# Patient Record
Sex: Female | Born: 1935 | Race: White | Hispanic: No | Smoking: Former smoker
Health system: Southern US, Community
[De-identification: ages and names within clinical notes are randomized; demographics above are authoritative.]

## PROBLEM LIST (undated history)

## (undated) DIAGNOSIS — I4891 Unspecified atrial fibrillation: Secondary | ICD-10-CM

## (undated) DIAGNOSIS — E079 Disorder of thyroid, unspecified: Secondary | ICD-10-CM

## (undated) DIAGNOSIS — M069 Rheumatoid arthritis, unspecified: Principal | ICD-10-CM

## (undated) DIAGNOSIS — K859 Acute pancreatitis without necrosis or infection, unspecified: Secondary | ICD-10-CM

## (undated) DIAGNOSIS — E785 Hyperlipidemia, unspecified: Secondary | ICD-10-CM

## (undated) DIAGNOSIS — B029 Zoster without complications: Secondary | ICD-10-CM

## (undated) DIAGNOSIS — I2699 Other pulmonary embolism without acute cor pulmonale: Secondary | ICD-10-CM

## (undated) DIAGNOSIS — J449 Chronic obstructive pulmonary disease, unspecified: Secondary | ICD-10-CM

## (undated) DIAGNOSIS — I10 Essential (primary) hypertension: Secondary | ICD-10-CM

## (undated) HISTORY — PX: TOTAL HIP ARTHROPLASTY: SHX124

## (undated) HISTORY — PX: APPENDECTOMY: SHX54

## (undated) HISTORY — PX: FOOT SURGERY: SHX648

---

## 2013-10-04 ENCOUNTER — Encounter (HOSPITAL_COMMUNITY): Payer: Self-pay | Admitting: Emergency Medicine

## 2013-10-04 ENCOUNTER — Emergency Department (HOSPITAL_COMMUNITY): Payer: Medicare Other

## 2013-10-04 ENCOUNTER — Emergency Department (HOSPITAL_COMMUNITY)
Admission: EM | Admit: 2013-10-04 | Discharge: 2013-10-04 | Disposition: A | Discharge status: Home / Self Care | Payer: Medicare Other | Attending: Emergency Medicine | Admitting: Emergency Medicine

## 2013-10-04 DIAGNOSIS — J449 Chronic obstructive pulmonary disease, unspecified: Secondary | ICD-10-CM | POA: Insufficient documentation

## 2013-10-04 DIAGNOSIS — J4489 Other specified chronic obstructive pulmonary disease: Secondary | ICD-10-CM | POA: Insufficient documentation

## 2013-10-04 DIAGNOSIS — M7989 Other specified soft tissue disorders: Secondary | ICD-10-CM | POA: Insufficient documentation

## 2013-10-04 DIAGNOSIS — IMO0002 Reserved for concepts with insufficient information to code with codable children: Secondary | ICD-10-CM | POA: Insufficient documentation

## 2013-10-04 DIAGNOSIS — G8929 Other chronic pain: Secondary | ICD-10-CM | POA: Insufficient documentation

## 2013-10-04 DIAGNOSIS — I10 Essential (primary) hypertension: Secondary | ICD-10-CM | POA: Insufficient documentation

## 2013-10-04 DIAGNOSIS — E079 Disorder of thyroid, unspecified: Secondary | ICD-10-CM | POA: Insufficient documentation

## 2013-10-04 DIAGNOSIS — M25549 Pain in joints of unspecified hand: Secondary | ICD-10-CM | POA: Insufficient documentation

## 2013-10-04 DIAGNOSIS — M542 Cervicalgia: Secondary | ICD-10-CM | POA: Insufficient documentation

## 2013-10-04 DIAGNOSIS — Z79899 Other long term (current) drug therapy: Secondary | ICD-10-CM | POA: Insufficient documentation

## 2013-10-04 DIAGNOSIS — M25561 Pain in right knee: Secondary | ICD-10-CM

## 2013-10-04 DIAGNOSIS — M79641 Pain in right hand: Secondary | ICD-10-CM

## 2013-10-04 DIAGNOSIS — M25569 Pain in unspecified knee: Secondary | ICD-10-CM | POA: Insufficient documentation

## 2013-10-04 HISTORY — DX: Chronic obstructive pulmonary disease, unspecified: J44.9

## 2013-10-04 HISTORY — DX: Disorder of thyroid, unspecified: E07.9

## 2013-10-04 HISTORY — DX: Essential (primary) hypertension: I10

## 2013-10-04 MED ORDER — PREDNISONE 20 MG PO TABS: 40.0000 mg | ORAL_TABLET | Freq: Every day | ORAL | Status: DC

## 2013-10-04 MED ORDER — KETOROLAC TROMETHAMINE 60 MG/2ML IM SOLN
30.0000 mg | Freq: Once | INTRAMUSCULAR | Status: AC
  Administered 2013-10-04: 30 mg via INTRAMUSCULAR
  Filled 2013-10-04: qty 2

## 2013-10-04 MED ORDER — TRAMADOL HCL 50 MG PO TABS
50.0000 mg | ORAL_TABLET | Freq: Once | ORAL | Status: AC
  Administered 2013-10-04: 50 mg via ORAL
  Filled 2013-10-04: qty 1

## 2013-10-04 MED ORDER — FENTANYL CITRATE 0.05 MG/ML IJ SOLN
100.0000 ug | Freq: Once | INTRAMUSCULAR | Status: AC
Start: 2013-10-04 — End: 2013-10-04
  Administered 2013-10-04: 100 ug via INTRAMUSCULAR
  Filled 2013-10-04: qty 2

## 2013-10-04 MED ORDER — OXYCODONE HCL 5 MG PO TABS: 5.0000 mg | ORAL_TABLET | ORAL | Status: DC | PRN

## 2013-10-04 MED ORDER — PREDNISONE 50 MG PO TABS
60.0000 mg | ORAL_TABLET | Freq: Once | ORAL | Status: AC
  Administered 2013-10-04: 60 mg via ORAL
  Filled 2013-10-04 (×2): qty 1

## 2013-10-04 NOTE — ED Notes (Signed)
Pt aware of outpatient [place] for October 06, 2013 at [facility] radiology at 8am

## 2013-10-04 NOTE — ED Notes (Signed)
Worsening knee pain since December 17th. States pain to neck with both arms tingling x 1 week. Finger tips feel like the are burned. States unable to grasp anything with right hand.

## 2013-10-04 NOTE — ED Provider Notes (Signed)
CSN: [id number]     Arrival date & time 10/04/13  1159 History  This chart was scribed for [employer]. Rubin Payor, MD by Bennett Scrape, ED Scribe. This patient was seen in room APA03/APA03 and the patient's care was started at 1:36 PM.   CC: Paresthesias   The history is provided by the patient. No language interpreter was used.   HPI Comments: Kristina May is a 78 y.o. female who presents to the Emergency Department complaining of intermittent bilateral arm paresthesias described as tingling with a glove distribution with associated bilateral hand swelling that started 4 days ago. Pt reports that the right hand is more swollen than the left and she states that she has been unable to close her right hand fully. During the episodes, she states that she has pain comes on rapidly in her hands and radiates into bilateral shoulders and she has a burnt feeling to her fingertips. She also reports associated neck pain beneath the occipital region. She denies that the pain feels like it's coming from the neck stating that it starts in her hands and works up to the neck. She also reports cramping and paresthesias in her toes as well. She has tried taking Aleve with no improvement. She denies any decreased ROM of her elbows or shoulders during the episodes.She denies having any prior episodes of the same or h/o neck pain. She denies having any recent falls or traumas.  She denies any decreased sensations, weakness or true numbness. She denies smoking or h/o CA. 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 She denies any prolonged computer use or other repetitive hand motions. She states that she gets blood work every 6 months per request of her PCP, last one was Sep 03, 2013. Only notable lab was the elevated HLD.   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 She reports that she has had a prior US of the knee that was negative on Dec. 17th, 2014. She states  that she receives cortisone injections in the left hip due to DDD. She denies any worsening of symptoms after her last injection. She denies being on any chronic pain medication for her issues. She denies any prior RA diagnoses.     Past Medical History  Diagnosis Date  . COPD (chronic obstructive pulmonary disease)   . Hypertension   . Thyroid disease    Past Surgical History  Procedure Laterality Date  . Foot surgery     No family history on file. History  Substance Use Topics  . Smoking status: Never Smoker   . Smokeless tobacco: Not on file  . Alcohol Use: Yes     Comment: Occ   No OB history provided.  Review of Systems  Musculoskeletal: Positive for neck pain. Negative for back pain.  Neurological: Negative for weakness and numbness.  All other systems reviewed and are negative.    Allergies  Other  Home Medications   Current Outpatient Rx  Name  Route  Sig  Dispense  Refill  . Cholecalciferol ([employer] VIT D 5000 HIGH-POTENCY PO)   Oral   Take 1 tablet by mouth daily.         Marland Kitchen co-enzyme Q-10 30 MG capsule   Oral   Take 30 mg by mouth daily.         . Fluticasone-Salmeterol (ADVAIR) 250-50 MCG/DOSE AEPB   Inhalation   Inhale 1 puff into the lungs 2 (two) times  daily.         . glucosamine-chondroitin 500-400 MG tablet   Oral   Take 1 tablet by mouth daily.         Marland Kitchen levothyroxine (SYNTHROID, LEVOTHROID) 125 MCG tablet   Oral   Take 125 mcg by mouth daily before breakfast.         . metoprolol (LOPRESSOR) 100 MG tablet   Oral   Take 100 mg by mouth 2 (two) times daily. Patient takes 1 tablet equal to 100 mg in the morning and takes 1/2 tablet 50 mg in the evening         . Multiple Vitamin (MULTIVITAMIN WITH MINERALS) TABS tablet   Oral   Take 1 tablet by mouth daily.         . pravastatin (PRAVACHOL) 40 MG tablet   Oral   Take 40 mg by mouth at bedtime.         Marland Kitchen tiotropium (SPIRIVA) 18 MCG inhalation capsule   Inhalation   Place  18 mcg into inhaler and inhale daily.         Marland Kitchen oxyCODONE (OXY IR/ROXICODONE) 5 MG immediate release tablet   Oral   Take 1 tablet (5 mg total) by mouth every 4 (four) hours as needed for moderate pain or severe pain.   10 tablet   0   . predniSONE (DELTASONE) 20 MG tablet   Oral   Take 2 tablets (40 mg total) by mouth daily.   6 tablet   0     Triage Vitals: BP 154/77  Pulse 63  Temp(Src) 98.7 F (37.1 C) (Oral)  Resp 18  Ht 5\' 6"  (1.676 m)  Wt 230 lb (104.327 kg)  BMI 37.14 kg/m2  SpO2 98%  Physical Exam  Nursing note and vitals reviewed. Constitutional: She is oriented to person, place, and time. She appears well-developed and well-nourished. No distress.  HENT:  Head: Normocephalic and atraumatic.  Eyes: EOM are normal.  Neck: Neck supple. No tracheal deviation present.  Mild c-spine midline tenderness. Mild bilateral trapezial tenderness  Cardiovascular: Normal rate.   Pulmonary/Chest: Effort normal. No respiratory distress.  Musculoskeletal:  Swelling of right hand from wrist distally, decreased flexion and extension due to pain, decreased flexion and extension of right fingers due to pain, paresthesias over the right hand but sensation is intact, strong radial pulse, good cap refill, edema of right lower leg and right knee which are chronic per pt. Normal ROM of the right elbow and shoulder  Neurological: She is alert and oriented to person, place, and time.  Good strength of the upper extremities.  Skin: Skin is warm and dry.  Psychiatric: She has a normal mood and affect. Her behavior is normal.    ED Course  Procedures (including critical care time)  DIAGNOSTIC STUDIES: Oxygen Saturation is 98% on RA, normal by my interpretation.    COORDINATION OF CARE: 1:48 PM-Advised pt that her symptoms are less likely from problems with blood flow. Discussed treatment plan which includes x-ray of the right hand and c-spine with pt at bedside and pt agreed to plan.  She is requesting to have something other than Tylenol products or hydrocodone due to them "not working".     Labs Review Labs Reviewed - No data to display Imaging Review Dg Cervical Spine Complete  10/04/2013   CLINICAL DATA:  Neck and bilateral arm pain.  EXAM: CERVICAL SPINE  4+ VIEWS  COMPARISON:  None.  FINDINGS: The cervical vertebral bodies  are normally aligned. Disc spaces are fairly well maintained. Mild degenerative disc disease at C5-6. The facets are normally aligned. There is mild bilateral bony foraminal narrowing at C5-6 due to uncinate spurring changes. No acute bony findings. No abnormal prevertebral soft tissue swelling. The C1-2 articulations are maintained. The lung apices are clear.  IMPRESSION: Degenerative disc disease mainly at C5-6 with bilateral bony foraminal narrowing.  No acute bony findings.   Electronically Signed   By: Loralie Champagne M.D.   On: 10/04/2013 14:44   Dg Hand Complete Right  10/04/2013   CLINICAL DATA:  Right hand pain.  EXAM: RIGHT HAND - COMPLETE 3+ VIEW  COMPARISON:  None.  FINDINGS: Mild to moderate osteoarthritic type degenerative changes involving the DIP and PIP joints of the fingers. No findings for under erosive arthropathy or acute abnormality.  IMPRESSION: No acute bony findings.  Mild degenerative changes.   Electronically Signed   By: Loralie Champagne M.D.   On: 10/04/2013 14:45    EKG Interpretation   None       MDM   1. Right hand pain   2. Knee pain, chronic, right    Patient with pain in her right hand with some mild swelling. Also as had similar pain in her left hand, but that has resolved. She also has neck pain. She's had pain in her right knee for a while also, she's had swelling in that leg but has had a negative ultrasound. X-ray of the hand are reassuring. X-ray of the neck shows some degenerative disc disease, but not a level which would give her the paresthesias over the middle of her hand. We'll give steroids and pain  medicines. Will get ultrasound tomorrow.  I have personally performed the services described in this documentation, which was scribed in my presence. The recorded information has been reviewed and is accurate.     Juliet Rude. Rubin Payor, MD 10/04/13 2200

## 2013-10-04 NOTE — Discharge Instructions (Signed)
Pain of Unknown Etiology (Pain Without a Known Cause) °You have come to your caregiver because of pain. Pain can occur in any part of the body. Often there is not a definite cause. If your laboratory (blood or urine) work was normal and x-rays or other studies were normal, your caregiver may treat you without knowing the cause of the pain. An example of this is the headache. Most headaches are diagnosed by taking a history. This means your caregiver asks you questions about your headaches. Your caregiver determines a treatment based on your answers. Usually testing done for headaches is normal. Often testing is not done unless there is no response to medications. Regardless of where your pain is located today, you can be given medications to make you comfortable. If no physical cause of pain can be found, most cases of pain will gradually leave as suddenly as they came.  °If you have a painful condition and no reason can be found for the pain, It is importantthat you follow up with your caregiver. If the pain becomes worse or does not go away, it may be necessary to repeat tests and look further for a possible cause. °· Only take over-the-counter or prescription medicines for pain, discomfort, or fever as directed by your caregiver. °· For the protection of your privacy, test results can not be given over the phone. Make sure you receive the results of your test. Ask as to how these results are to be obtained if you have not been informed. It is your responsibility to obtain your test results. °· You may continue all activities unless the activities cause more pain. When the pain lessens, it is important to gradually resume normal activities. Resume activities by beginning slowly and gradually increasing the intensity and duration of the activities or exercise. During periods of severe pain, bed-rest may be helpful. Lay or sit in any position that is comfortable. °· Ice used for acute (sudden) conditions may be  effective. Use a large plastic bag filled with ice and wrapped in a towel. This may provide pain relief. °· See your caregiver for continued problems. They can help or refer you for exercises or physical therapy if necessary. °If you were given medications for your condition, do not drive, operate machinery or power tools, or sign legal documents for 24 hours. Do not drink alcohol, take sleeping pills, or take other medications that may interfere with treatment. °See your caregiver immediately if you have pain that is becoming worse and not relieved by medications. °Document Released: 06/12/2001 Document Revised: 12/10/2011 Document Reviewed: 09/17/2005 °ExitCare® Patient Information ©2014 [employer]. ° °

## 2013-10-06 ENCOUNTER — Ambulatory Visit (HOSPITAL_COMMUNITY)
Admit: 2013-10-06 | Discharge: 2013-10-06 | Disposition: A | Discharge status: Home / Self Care | Payer: Medicare Other | Source: Ambulatory Visit | Attending: Emergency Medicine | Admitting: Emergency Medicine

## 2013-10-06 DIAGNOSIS — M79609 Pain in unspecified limb: Secondary | ICD-10-CM | POA: Insufficient documentation

## 2013-10-06 DIAGNOSIS — M7989 Other specified soft tissue disorders: Secondary | ICD-10-CM | POA: Insufficient documentation

## 2013-10-06 NOTE — ED Provider Notes (Signed)
Pt for upper extr [place].  [place] neg.  Pt referred to Dr. Gerilyn Pilgrim and given 30 percocet for pain  Benny Lennert, MD 10/06/13 [phone]

## 2013-10-11 ENCOUNTER — Encounter (HOSPITAL_COMMUNITY): Payer: Self-pay | Admitting: Emergency Medicine

## 2013-10-11 ENCOUNTER — Emergency Department (HOSPITAL_COMMUNITY): Payer: Medicare Other

## 2013-10-11 ENCOUNTER — Inpatient Hospital Stay (HOSPITAL_COMMUNITY): Payer: Medicare Other

## 2013-10-11 ENCOUNTER — Observation Stay (HOSPITAL_COMMUNITY)
Admission: EM | Admit: 2013-10-11 | Discharge: 2013-10-12 | Disposition: A | Discharge status: Home / Self Care | Payer: Medicare Other | Attending: Family Medicine | Admitting: Family Medicine

## 2013-10-11 DIAGNOSIS — I10 Essential (primary) hypertension: Secondary | ICD-10-CM | POA: Diagnosis present

## 2013-10-11 DIAGNOSIS — Z87891 Personal history of nicotine dependence: Secondary | ICD-10-CM | POA: Insufficient documentation

## 2013-10-11 DIAGNOSIS — M2550 Pain in unspecified joint: Secondary | ICD-10-CM

## 2013-10-11 DIAGNOSIS — J4489 Other specified chronic obstructive pulmonary disease: Secondary | ICD-10-CM | POA: Insufficient documentation

## 2013-10-11 DIAGNOSIS — E785 Hyperlipidemia, unspecified: Secondary | ICD-10-CM | POA: Diagnosis present

## 2013-10-11 DIAGNOSIS — M19049 Primary osteoarthritis, unspecified hand: Secondary | ICD-10-CM | POA: Diagnosis present

## 2013-10-11 DIAGNOSIS — M130 Polyarthritis, unspecified: Secondary | ICD-10-CM | POA: Diagnosis present

## 2013-10-11 DIAGNOSIS — M25561 Pain in right knee: Secondary | ICD-10-CM | POA: Diagnosis present

## 2013-10-11 DIAGNOSIS — M069 Rheumatoid arthritis, unspecified: Principal | ICD-10-CM | POA: Diagnosis present

## 2013-10-11 DIAGNOSIS — IMO0002 Reserved for concepts with insufficient information to code with codable children: Secondary | ICD-10-CM | POA: Insufficient documentation

## 2013-10-11 DIAGNOSIS — M1710 Unilateral primary osteoarthritis, unspecified knee: Secondary | ICD-10-CM | POA: Insufficient documentation

## 2013-10-11 DIAGNOSIS — E039 Hypothyroidism, unspecified: Secondary | ICD-10-CM | POA: Diagnosis present

## 2013-10-11 DIAGNOSIS — J449 Chronic obstructive pulmonary disease, unspecified: Secondary | ICD-10-CM | POA: Insufficient documentation

## 2013-10-11 HISTORY — DX: Rheumatoid arthritis, unspecified: M06.9

## 2013-10-11 HISTORY — DX: Hyperlipidemia, unspecified: E78.5

## 2013-10-11 LAB — COMPREHENSIVE METABOLIC PANEL
ALBUMIN: 3.6 g/dL (ref 3.5–5.2)
ALT: 18 U/L (ref 0–35)
AST: 19 U/L (ref 0–37)
Alkaline Phosphatase: 80 U/L (ref 39–117)
BUN: 16 mg/dL (ref 6–23)
CO2: 25 mEq/L (ref 19–32)
CREATININE: 0.97 mg/dL (ref 0.50–1.10)
Calcium: 9.6 mg/dL (ref 8.4–10.5)
Chloride: 100 mEq/L (ref 96–112)
GFR calc Af Amer: 65 mL/min — ABNORMAL LOW (ref >90)
GFR, EST NON AFRICAN AMERICAN: 56 mL/min — AB (ref >90)
Glucose, Bld: 117 mg/dL — ABNORMAL HIGH (ref 70–99)
Potassium: 3.8 mEq/L (ref 3.7–5.3)
Sodium: 138 mEq/L (ref 137–147)
TOTAL PROTEIN: 6.7 g/dL (ref 6.0–8.3)
Total Bilirubin: 0.4 mg/dL (ref 0.3–1.2)

## 2013-10-11 LAB — CBC WITH DIFFERENTIAL/PLATELET
BASOS ABS: 0 10*3/uL (ref 0.0–0.1)
BASOS PCT: 0 % (ref 0–1)
EOS ABS: 0.2 10*3/uL (ref 0.0–0.7)
EOS PCT: 2 % (ref 0–5)
HCT: 41.8 % (ref 36.0–46.0)
Hemoglobin: 14.1 g/dL (ref 12.0–15.0)
Lymphocytes Relative: 14 % (ref 12–46)
Lymphs Abs: 1.8 10*3/uL (ref 0.7–4.0)
MCH: 30.9 pg (ref 26.0–34.0)
MCHC: 33.7 g/dL (ref 30.0–36.0)
MCV: 91.7 fL (ref 78.0–100.0)
MONO ABS: 1 10*3/uL (ref 0.1–1.0)
Monocytes Relative: 8 % (ref 3–12)
Neutro Abs: 10.2 10*3/uL — ABNORMAL HIGH (ref 1.7–7.7)
Neutrophils Relative %: 77 % (ref 43–77)
PLATELETS: 320 10*3/uL (ref 150–400)
RBC: 4.56 MIL/uL (ref 3.87–5.11)
RDW: 13.6 % (ref 11.5–15.5)
WBC: 13.2 10*3/uL — ABNORMAL HIGH (ref 4.0–10.5)

## 2013-10-11 LAB — SEDIMENTATION RATE: Sed Rate: 25 mm/hr — ABNORMAL HIGH (ref 0–22)

## 2013-10-11 MED ORDER — SODIUM CHLORIDE 0.9 % IV SOLN
INTRAVENOUS | Status: DC
  Administered 2013-10-11: 125 mL/h via INTRAVENOUS

## 2013-10-11 MED ORDER — METOPROLOL TARTRATE 25 MG PO TABS
50.0000 mg | ORAL_TABLET | Freq: Two times a day (BID) | ORAL | Status: DC
  Filled 2013-10-11: qty 4

## 2013-10-11 MED ORDER — MOMETASONE FURO-FORMOTEROL FUM 100-5 MCG/ACT IN AERO
2.0000 | INHALATION_SPRAY | Freq: Two times a day (BID) | RESPIRATORY_TRACT | Status: DC
  Administered 2013-10-12: 2 via RESPIRATORY_TRACT
  Filled 2013-10-11: qty 8.8

## 2013-10-11 MED ORDER — SIMVASTATIN 20 MG PO TABS
20.0000 mg | ORAL_TABLET | Freq: Every day | ORAL | Status: DC
Start: 2013-10-12 — End: 2013-10-12
  Administered 2013-10-12: 20 mg via ORAL
  Filled 2013-10-11: qty 1

## 2013-10-11 MED ORDER — LEVOTHYROXINE SODIUM 125 MCG PO TABS
125.0000 ug | ORAL_TABLET | Freq: Every day | ORAL | Status: DC
  Administered 2013-10-12: 125 ug via ORAL
  Filled 2013-10-11 (×3): qty 1

## 2013-10-11 MED ORDER — NAPROXEN SODIUM 220 MG PO TABS: 220.0000 mg | ORAL_TABLET | Freq: Every day | ORAL | Status: DC | PRN

## 2013-10-11 MED ORDER — OXYCODONE HCL 5 MG PO TABS
10.0000 mg | ORAL_TABLET | Freq: Once | ORAL | Status: AC
  Administered 2013-10-11: 10 mg via ORAL
  Filled 2013-10-11: qty 2

## 2013-10-11 MED ORDER — ADULT MULTIVITAMIN W/MINERALS CH
1.0000 | ORAL_TABLET | Freq: Every day | ORAL | Status: DC
  Administered 2013-10-12 (×2): 1 via ORAL
  Filled 2013-10-11 (×2): qty 1

## 2013-10-11 MED ORDER — OXYCODONE-ACETAMINOPHEN 5-325 MG PO TABS
1.0000 | ORAL_TABLET | Freq: Four times a day (QID) | ORAL | Status: DC | PRN
  Administered 2013-10-11 – 2013-10-12 (×3): 1 via ORAL
  Filled 2013-10-11 (×3): qty 1

## 2013-10-11 MED ORDER — GADOBENATE DIMEGLUMINE 529 MG/ML IV SOLN
20.0000 mL | Freq: Once | INTRAVENOUS | Status: AC
  Administered 2013-10-11: 20 mL via INTRAVENOUS

## 2013-10-11 MED ORDER — NAPROXEN 250 MG PO TABS
250.0000 mg | ORAL_TABLET | Freq: Every day | ORAL | Status: DC | PRN
  Filled 2013-10-11 (×3): qty 1

## 2013-10-11 MED ORDER — TIOTROPIUM BROMIDE MONOHYDRATE 18 MCG IN CAPS
18.0000 ug | ORAL_CAPSULE | Freq: Every day | RESPIRATORY_TRACT | Status: DC
  Administered 2013-10-12: 18 ug via RESPIRATORY_TRACT
  Filled 2013-10-11: qty 5

## 2013-10-11 MED ORDER — HEPARIN SODIUM (PORCINE) 5000 UNIT/ML IJ SOLN
5000.0000 [IU] | Freq: Three times a day (TID) | INTRAMUSCULAR | Status: DC
  Administered 2013-10-11 – 2013-10-12 (×3): 5000 [IU] via SUBCUTANEOUS
  Filled 2013-10-11 (×5): qty 1

## 2013-10-11 NOTE — ED Notes (Signed)
Pt in radiology.  Report given to floor for [name], Primary RN.  [name], EMT transporting pt.

## 2013-10-11 NOTE — H&P (Signed)
[facility] History and Physical  Kaily Wragg XLK:[id number] DOB: 02/12/1936 DOA: 10/11/2013  Referring physician: EDP PCP: No primary provider on file.   Chief Complaint: Polyarthritis   HPI: Kristina May is a 78 y.o. female who presents to the ED with c/o joint pain.  Pain is located in index and middle finger MCP joints bilaterally, bilateral knee joints, neck pain.  Symptoms onset 3 days ago (of note she completed a course of steroids at that time).  Symptoms persistent since then.  Review of Systems: No recent travel or insect bites that she knows of, Systems reviewed.  As above, otherwise negative  Past Medical History  Diagnosis Date  . COPD (chronic obstructive pulmonary disease)   . Hypertension   . Thyroid disease    Past Surgical History  Procedure Laterality Date  . Foot surgery     Social History:  reports that she has quit smoking. She does not have any smokeless tobacco history on file. She reports that she drinks alcohol. She reports that she does not use illicit drugs.  Allergies  Allergen Reactions  . Latex Other (See Comments)    Causes blisters  . Betamethasone Hives  . Dexamethasone Hives  . Tape Other (See Comments)    Caused blisters   . Tylenol [Acetaminophen] Other (See Comments)    Doesn't work for patient   . Claritin [Loratadine] Other (See Comments)    Doesn't work for patient    Family History  Problem Relation Age of Onset  . Multiple sclerosis       Prior to Admission medications   Medication Sig Start Date End Date Taking? Authorizing Provider  Cholecalciferol (VITAMIN D) 2000 UNITS tablet Take 2,000 Units by mouth daily.   Yes Historical Provider, MD  Coenzyme Q10 200 MG capsule Take 200 mg by mouth daily.   Yes Historical Provider, MD  Fluticasone-Salmeterol (ADVAIR) 250-50 MCG/DOSE AEPB Inhale 1 puff into the lungs 2 (two) times daily.   Yes Historical Provider, MD  glucosamine-chondroitin 500-400 MG tablet Take 1  tablet by mouth daily.   Yes Historical Provider, MD  levothyroxine (SYNTHROID, LEVOTHROID) 125 MCG tablet Take 125 mcg by mouth daily before breakfast.   Yes Historical Provider, MD  Menthol-Methyl Salicylate (MUSCLE RUB EX) Apply 1 application topically daily as needed (for pain).   Yes Historical Provider, MD  metoprolol (LOPRESSOR) 100 MG tablet Take 50-100 mg by mouth 2 (two) times daily. Patient takes 1 tablet equal to 100 mg in the morning and takes 1/2 tablet 50 mg in the evening   Yes Historical Provider, MD  Multiple Vitamin (MULTIVITAMIN WITH MINERALS) TABS tablet Take 1 tablet by mouth daily.   Yes Historical Provider, MD  naproxen sodium (ANAPROX) 220 MG tablet Take 220 mg by mouth daily as needed (for pain).   Yes Historical Provider, MD  oxyCODONE-acetaminophen (PERCOCET/ROXICET) 5-325 MG per tablet Take 1 tablet by mouth every 6 (six) hours as needed for severe pain.   Yes Historical Provider, MD  pravastatin (PRAVACHOL) 40 MG tablet Take 40 mg by mouth at bedtime.   Yes Historical Provider, MD  tiotropium (SPIRIVA) 18 MCG inhalation capsule Place 18 mcg into inhaler and inhale daily.   Yes Historical Provider, MD  predniSONE (DELTASONE) 20 MG tablet Take 2 tablets (40 mg total) by mouth daily. 10/04/13   Juliet Rude. Rubin Payor, MD   Physical Exam: Filed Vitals:   10/11/13 2003  BP: 139/57  Pulse: 80  Temp:   Resp:  BP 139/57  Pulse 80  Temp(Src) 98.7 F (37.1 C) (Oral)  Resp 19  SpO2 98%  General Appearance:    Alert, oriented, no distress, appears stated age  Head:    Normocephalic, atraumatic  Eyes:    PERRL, EOMI, sclera non-icteric        Nose:   Nares without drainage or epistaxis. Mucosa, turbinates normal  Throat:   Moist mucous membranes. Oropharynx without erythema or exudate.  Neck:   Supple. No carotid bruits.  No thyromegaly.  No lymphadenopathy.   Back:     No CVA tenderness, no spinal tenderness  Lungs:     Clear to auscultation bilaterally, without  wheezes, rhonchi or rales  Chest wall:    No tenderness to palpitation  Heart:    Regular rate and rhythm without murmurs, gallops, rubs  Abdomen:     Soft, non-tender, nondistended, normal bowel sounds, no organomegaly  Genitalia:    deferred  Rectal:    deferred  Extremities:   Index and middle finger MCP joints swolen and inflamed on B hands, B knee pain.  Pulses:   2+ and symmetric all extremities  Skin:   Skin color, texture, turgor normal, no rashes or lesions  Lymph nodes:   Cervical, supraclavicular, and axillary nodes normal  Neurologic:   CNII-XII intact. Normal strength, sensation and reflexes      throughout    Labs on Admission:  Basic Metabolic Panel:  Recent Labs Lab 10/11/13 1733  NA 138  K 3.8  CL 100  CO2 25  GLUCOSE 117*  BUN 16  CREATININE 0.97  CALCIUM 9.6   Liver Function Tests:  Recent Labs Lab 10/11/13 1733  AST 19  ALT 18  ALKPHOS 80  BILITOT 0.4  PROT 6.7  ALBUMIN 3.6   No results found for this basename: LIPASE, AMYLASE,  in the last 168 hours No results found for this basename: AMMONIA,  in the last 168 hours CBC:  Recent Labs Lab 10/11/13 1733  WBC 13.2*  NEUTROABS 10.2*  HGB 14.1  HCT 41.8  MCV 91.7  PLT 320   Cardiac Enzymes: No results found for this basename: CKTOTAL, CKMB, CKMBINDEX, TROPONINI,  in the last 168 hours  BNP (last 3 results) No results found for this basename: PROBNP,  in the last 8760 hours CBG: No results found for this basename: GLUCAP,  in the last 168 hours  Radiological Exams on Admission: Mr Laqueta Jean Wo Contrast  10/11/2013   CLINICAL DATA:  Swelling of the right upper and lower extremity. Worsening pain and numbness.  EXAM: MRI HEAD WITHOUT AND WITH CONTRAST  MRI CERVICAL SPINE WITHOUT AND WITH CONTRAST  TECHNIQUE: Multiplanar, multiecho pulse sequences of the brain and surrounding structures, and cervical spine, to include the craniocervical junction and cervicothoracic junction, were obtained  without and with intravenous contrast.  CONTRAST:  87mL MULTIHANCE GADOBENATE DIMEGLUMINE 529 MG/ML IV SOLN  COMPARISON:  None.  FINDINGS: MRI HEAD FINDINGS  Diffusion imaging does not show any acute or subacute infarction. The brainstem and cerebellum are normal. The cerebral hemispheres show scattered foci of T2 and FLAIR signal within the deep white matter consistent with chronic small vessel disease. No cortical or large vessel territory infarction. No mass lesion, hemorrhage, hydrocephalus or extra-axial collection. After contrast administration, no abnormal enhancement occurs.  MRI CERVICAL SPINE FINDINGS  Alignment is normal. Ample subarachnoid space surrounds the cervical spinal cord. No cord lesion. No abnormal enhancement.  C3-4 show shallow protrusion of disk  material that narrows the ventral subarachnoid space but does not affect the cord. There is moderate foraminal stenosis bilaterally.  C5-6 shows spondylosis with endplate osteophytes and bulging of the disc. There is foraminal narrowing bilaterally of a moderate degree.  No significant osseous lesion. Benign hemangioma within the T1 vertebral body.  IMPRESSION: MRI brain: No acute finding. Chronic small-vessel changes within the hemispheric white matter.  MRI cervical spine: No cord lesion. 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   Electronically Signed   By: Paulina FusiMark  Shogry M.D.   On: 10/11/2013 20:04   Mr Cervical Spine W Wo Contrast  10/11/2013   CLINICAL DATA:  Swelling of the right upper and lower extremity. Worsening pain and numbness.  EXAM: MRI HEAD WITHOUT AND WITH CONTRAST  MRI CERVICAL SPINE WITHOUT AND WITH CONTRAST  TECHNIQUE: Multiplanar, multiecho pulse sequences of the brain and surrounding structures, and cervical spine, to include the craniocervical junction and cervicothoracic junction, were obtained without and with intravenous contrast.  CONTRAST:  20mL  MULTIHANCE GADOBENATE DIMEGLUMINE 529 MG/ML IV SOLN  COMPARISON:  None.  FINDINGS: MRI HEAD FINDINGS  Diffusion imaging does not show any acute or subacute infarction. The brainstem and cerebellum are normal. The cerebral hemispheres show scattered foci of T2 and FLAIR signal within the deep white matter consistent with chronic small vessel disease. No cortical or large vessel territory infarction. No mass lesion, hemorrhage, hydrocephalus or extra-axial collection. After contrast administration, no abnormal enhancement occurs.  MRI CERVICAL SPINE FINDINGS  Alignment is normal. Ample subarachnoid space surrounds the cervical spinal cord. No cord lesion. No abnormal enhancement.  C3-4 show shallow protrusion of disk material that narrows the ventral subarachnoid space but does not affect the cord. There is moderate foraminal stenosis bilaterally.  C5-6 shows spondylosis with endplate osteophytes and bulging of the disc. There is foraminal narrowing bilaterally of a moderate degree.  No significant osseous lesion. Benign hemangioma within the T1 vertebral body.  IMPRESSION: MRI brain: No acute finding. Chronic small-vessel changes within the hemispheric white matter.  MRI cervical spine: No cord lesion. 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   Electronically Signed   By: Paulina FusiMark  Shogry M.D.   On: 10/11/2013 20:04    EKG: Independently reviewed.  Assessment/Plan Active Problems:   Polyarthritis   1. Symmetric polyarthritis - effecting the MCP joints on hands, knees.  Highly suspicious for autoimmune or reactive arthritis.  Work up ordered thus far includes RF, CCP, CRP, ANA, HCV, Lyme, B19.  DDx is broad and includes but not limited to: RA, SLE, viral arthritis, Lyme, PMR, among others.  Likely will wish to get Rheumatology involved to determine best course of treatment.    Code Status: Full Code  Family Communication: Family at  bedside Disposition Plan: Admit to inpatient   Time spent: 70 min  GARDNER, JARED M. [facility] Pager [phone]  If 7AM-7PM, please contact the day team taking care of the patient [email or web address] Password [email or web address] 10/11/2013, 9:28 PM

## 2013-10-11 NOTE — ED Provider Notes (Signed)
CSN: [id number]     Arrival date & time 10/11/13  1520 History   First MD Initiated Contact with Patient 10/11/13 1647     Chief Complaint  Patient presents with  . Joint Pain    (Consider location/radiation/quality/duration/timing/severity/associated sxs/prior Treatment) The history is provided by the patient.   Patient here complaining of a three-day history of bilateral upper extremity weakness and pain. Symptoms have been persistent and have been associated with neck pain. Patient seen recently for similar complaints and had a plan x-rays of her C-spine which showed DJD. Denies any change in her bowel or bladder function. Does note some weakness in her legs as well but not as severe as her upper arms. Decreased grip strength noted. Symptoms worse with movement and better with rest. Past Medical History  Diagnosis Date  . COPD (chronic obstructive pulmonary disease)   . Hypertension   . Thyroid disease    Past Surgical History  Procedure Laterality Date  . Foot surgery     No family history on file. History  Substance Use Topics  . Smoking status: Former Games developer  . Smokeless tobacco: Not on file  . Alcohol Use: Yes     Comment: Occ   OB History   Grav Para Term Preterm Abortions TAB SAB Ect Mult Living                 Review of Systems  All other systems reviewed and are negative.    Allergies  Dexamethasone; Latex; Tape; Tylenol; Betamethasone; and Claritin  Home Medications   Current Outpatient Rx  Name  Route  Sig  Dispense  Refill  . Cholecalciferol (VITAMIN D) 2000 UNITS tablet   Oral   Take 2,000 Units by mouth daily.         . Coenzyme Q10 200 MG capsule   Oral   Take 200 mg by mouth daily.         . Fluticasone-Salmeterol (ADVAIR) 250-50 MCG/DOSE AEPB   Inhalation   Inhale 1 puff into the lungs 2 (two) times daily.         Marland Kitchen glucosamine-chondroitin 500-400 MG tablet   Oral   Take 1 tablet by mouth daily.         Marland Kitchen levothyroxine  (SYNTHROID, LEVOTHROID) 125 MCG tablet   Oral   Take 125 mcg by mouth daily before breakfast.         . metoprolol (LOPRESSOR) 100 MG tablet   Oral   Take 50-100 mg by mouth 2 (two) times daily. Patient takes 1 tablet equal to 100 mg in the morning and takes 1/2 tablet 50 mg in the evening         . Multiple Vitamin (MULTIVITAMIN WITH MINERALS) TABS tablet   Oral   Take 1 tablet by mouth daily.         Marland Kitchen oxyCODONE-acetaminophen (PERCOCET/ROXICET) 5-325 MG per tablet   Oral   Take 1 tablet by mouth every 6 (six) hours as needed for severe pain.         . pravastatin (PRAVACHOL) 40 MG tablet   Oral   Take 40 mg by mouth at bedtime.         . predniSONE (DELTASONE) 20 MG tablet   Oral   Take 2 tablets (40 mg total) by mouth daily.   6 tablet   0   . tiotropium (SPIRIVA) 18 MCG inhalation capsule   Inhalation   Place 18 mcg into inhaler and inhale daily.  BP 141/64  Pulse 76  Temp(Src) 98.7 F (37.1 C) (Oral)  Resp 19  SpO2 96% Physical Exam  Vitals reviewed. Constitutional: She is oriented to person, place, and time. She appears well-developed and well-nourished.  Non-toxic appearance. No distress.  HENT:  Head: Normocephalic and atraumatic.  Eyes: Conjunctivae, EOM and lids are normal. Pupils are equal, round, and reactive to light.  Neck: Normal range of motion. Neck supple. No tracheal deviation present. No mass present.  Cardiovascular: Normal rate, regular rhythm and normal heart sounds.  Exam reveals no gallop.   No murmur heard. Pulmonary/Chest: Effort normal and breath sounds normal. No stridor. No respiratory distress. She has no decreased breath sounds. She has no wheezes. She has no rhonchi. She has no rales.  Abdominal: Soft. Normal appearance and bowel sounds are normal. She exhibits no distension. There is no tenderness. There is no rebound and no CVA tenderness.  Musculoskeletal: Normal range of motion. She exhibits no edema and no  tenderness.       Hands: Swelling and erythema noted  Neurological: She is alert and oriented to person, place, and time. No cranial nerve deficit or sensory deficit. GCS eye subscore is 4. GCS verbal subscore is 5. GCS motor subscore is 6.  Reflex Scores:      Brachioradialis reflexes are 1+ on the right side and 1+ on the left side. Bilateral upper extremity weakness with 3 of 5 strength. Normal abduction of arms. Radial pulse 2+ bilaterally.  Skin: Skin is warm and dry. No abrasion and no rash noted.  Psychiatric: She has a normal mood and affect. Her speech is normal and behavior is normal.    ED Course  Procedures (including critical care time) Labs Review Labs Reviewed  CBC WITH DIFFERENTIAL  COMPREHENSIVE METABOLIC PANEL  SEDIMENTATION RATE   Imaging Review No results found.  EKG Interpretation   None       MDM  No diagnosis found. Patient given 2 Percocet here for pain. Patient has had trouble walking due to pain in her knees as well as her hips. Her MRI of her brain and C-spine were negative. However due to her continuing pain and unclear etiology of her symptoms she will be admitted by the hospitalist service for further evaluation.    Toy Baker, MD 10/11/13 2101

## 2013-10-11 NOTE — ED Notes (Signed)
Pt states she received cortisone shot 12/17 to left hip then started having right knee pain with swelling.  Doppler was done and ruled out blood clots but was reported she had 2 Baker's Cyst.  Pt reports that she went to [facility] on 1/4 after waking up with both arms numb and right hand swollen,  Cervical xrays done and right arm doppler negative for clot.  Pt now returns with right knee pain, left hand swelling.

## 2013-10-11 NOTE — ED Notes (Signed)
Pt currently in MRI

## 2013-10-12 ENCOUNTER — Encounter (HOSPITAL_COMMUNITY): Payer: Self-pay | Admitting: Family Medicine

## 2013-10-12 ENCOUNTER — Inpatient Hospital Stay (HOSPITAL_COMMUNITY): Payer: Medicare Other

## 2013-10-12 DIAGNOSIS — M19049 Primary osteoarthritis, unspecified hand: Secondary | ICD-10-CM

## 2013-10-12 DIAGNOSIS — I10 Essential (primary) hypertension: Secondary | ICD-10-CM | POA: Diagnosis present

## 2013-10-12 DIAGNOSIS — M2550 Pain in unspecified joint: Secondary | ICD-10-CM

## 2013-10-12 DIAGNOSIS — E785 Hyperlipidemia, unspecified: Secondary | ICD-10-CM

## 2013-10-12 DIAGNOSIS — M069 Rheumatoid arthritis, unspecified: Secondary | ICD-10-CM | POA: Diagnosis present

## 2013-10-12 DIAGNOSIS — M25561 Pain in right knee: Secondary | ICD-10-CM | POA: Diagnosis present

## 2013-10-12 DIAGNOSIS — M130 Polyarthritis, unspecified: Secondary | ICD-10-CM

## 2013-10-12 DIAGNOSIS — E039 Hypothyroidism, unspecified: Secondary | ICD-10-CM | POA: Diagnosis present

## 2013-10-12 LAB — RHEUMATOID FACTOR: Rhuematoid fact SerPl-aCnc: 215 IU/mL — ABNORMAL HIGH (ref <=14)

## 2013-10-12 LAB — HEPATITIS C ANTIBODY: HCV Ab: NEGATIVE

## 2013-10-12 LAB — C-REACTIVE PROTEIN: CRP: 3.7 mg/dL — ABNORMAL HIGH (ref <0.60)

## 2013-10-12 MED ORDER — BUPIVACAINE HCL 0.25 % IJ SOLN
5.0000 mL | Freq: Once | INTRAMUSCULAR | Status: AC
  Administered 2013-10-12: 5 mL via INTRA_ARTICULAR
  Filled 2013-10-12 (×2): qty 5

## 2013-10-12 MED ORDER — SENNOSIDES-DOCUSATE SODIUM 8.6-50 MG PO TABS: 1.0000 | ORAL_TABLET | Freq: Two times a day (BID) | ORAL | Status: DC | PRN

## 2013-10-12 MED ORDER — METHYLPREDNISOLONE ACETATE 80 MG/ML IJ SUSP
80.0000 mg | Freq: Once | INTRAMUSCULAR | Status: DC
Start: 2013-10-12 — End: 2013-10-12

## 2013-10-12 MED ORDER — METOPROLOL TARTRATE 50 MG PO TABS
50.0000 mg | ORAL_TABLET | Freq: Every day | ORAL | Status: DC
  Administered 2013-10-12: 50 mg via ORAL
  Filled 2013-10-12 (×2): qty 1

## 2013-10-12 MED ORDER — METHYLPREDNISOLONE ACETATE 40 MG/ML IJ SUSP
80.0000 mg | Freq: Once | INTRAMUSCULAR | Status: AC
  Administered 2013-10-12: 80 mg via INTRA_ARTICULAR
  Filled 2013-10-12: qty 2

## 2013-10-12 MED ORDER — NAPROXEN 500 MG PO TABS
500.0000 mg | ORAL_TABLET | Freq: Two times a day (BID) | ORAL | Status: DC
Start: 2013-10-12 — End: 2018-02-10

## 2013-10-12 MED ORDER — OXYCODONE-ACETAMINOPHEN 5-325 MG PO TABS: 1.0000 | ORAL_TABLET | Freq: Four times a day (QID) | ORAL | Status: DC | PRN

## 2013-10-12 MED ORDER — METOPROLOL TARTRATE 100 MG PO TABS
100.0000 mg | ORAL_TABLET | Freq: Every day | ORAL | Status: DC
  Administered 2013-10-12: 100 mg via ORAL
  Filled 2013-10-12: qty 1

## 2013-10-12 NOTE — Discharge Summary (Signed)
Physician Discharge Summary  Kristina May RXV:[id number] DOB: 31-Oct-1935 DOA: 10/11/2013  PCP: Dr. Marcelina Morel, MD FAX [phone]   Admit date: 10/11/2013 Discharge date: 10/12/2013  Recommendations for Outpatient Follow-up:  1. Please schedule patient with a rheumatologist asap 2. Please schedule patient with an orthopedic surgeon to follow up right knee  3. Recheck CBC and BMP on outpatient followup  4. Please send for records of pending rheumatology labs at [facility], [place]  Discharge Diagnoses:     Rheumatoid arthritis(714.0) - Acute   Active Problems:   Polyarthritis   Right knee pain   Arthritis pain, hand   Unspecified essential hypertension   Unspecified hypothyroidism   Dyslipidemia  Discharge Condition: stable   Diet recommendation: heart healthy  Filed Weights   10/11/13 2300  Weight: 226 lb (102.513 kg)    History of present illness:  Kristina May is a 78 y.o. female who presents to the ED with c/o joint pain. Pain is located in index and middle finger MCP joints bilaterally, bilateral knee joints, neck pain. Symptoms onset 3 days PTA (of note she completed a course of steroids at that time). She had been seen in the ED and they had given her the prescription for steroids.  She reports that she had some improvement in symptoms but they did not totally resolve. She also reported worsening swelling and pain in the right knee. Pt was admitted for pain control and for further workup.    Pt with recent onset of severe joint pain in bilateral hands and right knee. Seen in ED 09/16/2013 and 10/04/2013 for evaluation. Thus far doppler study for LE DVT was negative for clot and showed Bakers cyst right knee. She has completed a steroid dose pak. Persistent pain despite treatment with oral steroids and rest. No history of RA, gout or other autoimmune disease known to patient. No previous knee injury. 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 No previous knee  surgeries or injections. Now with swelling of right knee with pain on weight bearing and ROM. Has not tried ice or heat. History of IA steroid injections to hip for DJD with successful relief. Currently using glucosamine, naproxen and percocet for pain in addition to oral steroids. Has not seen a Rheumatologist.    Review of Systems: No recent travel or insect bites that she knows of, Systems reviewed. As above, otherwise negative  Hospital Course:  1. Symmetric polyarthritis - effecting the MCP joints on hands, knees. Highly suspicious for autoimmune or reactive arthritis. Work up ordered thus far includes RF, CCP, CRP, ANA, HCV, Lyme, B19. DDx is broad and includes but not limited to: RA, SLE, viral arthritis, Lyme, PMR, among others. Likely will wish to get Rheumatology involved to determine best course of treatment. Elevated Rheumatoid Factor suggesting RA, the RF was greater than 250. elevated CRP is greater than 3. Pt was counseled that she needs close outpatient rheumatology evaluation.  2. Acute on Chronic Right knee pain - check xray of right knee, orthopedics consulted (Dr. Ophelia Charter), PT evaluation ordered. Radiographs of the right knee with significant patellofemoral arthritis with large spurs off the anterior femur and inferior pole of the patellla. Evidence of partial rupture of quad tendon with large soft tissue calcification of the insertion at the quad tendon. Exam shows pt to have quad control without evidence of acute rupture. No sign or symptoms of infection of right knee. No effusion.          End stage patellofemoral osteoarthritis  right knee.  Agree with evaluation for autoimmune or reactive arthritis and need for rheumatology consult. This may be done on outpt . Labs reviewed with Dr Ophelia Charter  and a copy provided for pt. PLAN: IA injection to right knee joint. Ice packs. Continue with treatment with NSAIDS for knee. Will need orthopedic follow up to monitor knee symptoms as she will  eventually  need surgery for TKA. For now proceed with activity as tolerated with right knee. Local treatment with ice and heat. May benefit from Voltaren gel to knee also outpt.   Agree with rheumatology consult. May need other meds and DMARDS for treatment. Procedure: After informed verbal consent, pts right knee was prepped thoroughly with betadine. Knee was injected with  25% marcaine plain, 8cc and depomedrol 40mg /ml, 2cc. Betadine cleansed from skin with alcohol swab and bandaid applied. Pt tolerated the injection well.   3. Elevated WBC - pt has been on steroids (just completed a course), no s/s of infection found 4. Hypertension - resumed home medications  Procedures:  Right Knee steroid injection: Procedure: After informed verbal consent, pts right knee was prepped thoroughly with betadine. Knee was injected with  25% marcaine plain, 8cc and depomedrol 40mg /ml, 2cc. Betadine cleansed from skin with alcohol swab and bandaid applied. Pt tolerated the injection well.   Consultations:  Orthopedics   Discharge Exam: Pt tolerated right knee injection well and ambulated well with physical therapy Filed Vitals:   10/12/13 1555  BP: 154/76  Pulse: 69  Temp: 98.2 F (36.8 C)  Resp: 18   Discharge Instructions  Discharge Orders   Future Orders Complete By Expires   Diet - low sodium heart healthy  As directed    Discharge instructions  As directed    Comments:     Please return if symptoms recur, worsen or new problems develop.  Please arrange to see an orthopedic surgeon as soon as possible when you get home Please arrange to see a rheumatologist as soon as possible when you get home Please use the walker when ambulating for support and to avoid falling down.   Discontinue IV  As directed    Increase activity slowly  As directed        Medication List    STOP taking these medications       naproxen sodium 220 MG tablet  Commonly known as:  ANAPROX     predniSONE 20 MG  tablet  Commonly known as:  DELTASONE      TAKE these medications       Coenzyme Q10 200 MG capsule  Take 200 mg by mouth daily.     Fluticasone-Salmeterol 250-50 MCG/DOSE Aepb  Commonly known as:  ADVAIR  Inhale 1 puff into the lungs 2 (two) times daily.     glucosamine-chondroitin 500-400 MG tablet  Take 1 tablet by mouth daily.     levothyroxine 125 MCG tablet  Commonly known as:  SYNTHROID, LEVOTHROID  Take 125 mcg by mouth daily before breakfast.     metoprolol 100 MG tablet  Commonly known as:  LOPRESSOR  Take 50-100 mg by mouth 2 (two) times daily. Patient takes 1 tablet equal to 100 mg in the morning and takes 1/2 tablet 50 mg in the evening     multivitamin with minerals Tabs tablet  Take 1 tablet by mouth daily.     MUSCLE RUB EX  Apply 1 application topically daily as needed (for pain).     naproxen 500 MG tablet  Commonly known as:  NAPROSYN  Take 1 tablet (500 mg total) by mouth 2 (two) times daily with a meal.     oxyCODONE-acetaminophen 5-325 MG per tablet  Commonly known as:  PERCOCET/ROXICET  Take 1 tablet by mouth every 6 (six) hours as needed for severe pain.     pravastatin 40 MG tablet  Commonly known as:  PRAVACHOL  Take 40 mg by mouth at bedtime.     tiotropium 18 MCG inhalation capsule  Commonly known as:  SPIRIVA  Place 18 mcg into inhaler and inhale daily.     Vitamin D 2000 UNITS tablet  Take 2,000 Units by mouth daily.       Allergies  Allergen Reactions  . Latex Other (See Comments)    Causes blisters  . Betamethasone Hives  . Dexamethasone Hives  . Tape Other (See Comments)    Caused blisters   . Tylenol [Acetaminophen] Other (See Comments)    Doesn't work for patient   . Claritin [Loratadine] Other (See Comments)    Doesn't work for patient       Follow-up Information   Follow up with Marcelina Morel. Schedule an appointment as soon as possible for a visit in 1 week. [facility] Followup )       Follow up with  Orthopedic Surgeon. Schedule an appointment as soon as possible for a visit in 2 weeks. (Follow up Right Knee )       Follow up with Rheumatologist . Schedule an appointment as soon as possible for a visit in 2 weeks. (Evaluate for Rheumatoid Arthritis)      The results of significant diagnostics from this hospitalization (including imaging, microbiology, ancillary and laboratory) are listed below for reference.    Significant Diagnostic Studies: Dg Cervical Spine Complete  10/04/2013   CLINICAL DATA:  Neck and bilateral arm pain.  EXAM: CERVICAL SPINE  4+ VIEWS  COMPARISON:  None.  FINDINGS: The cervical vertebral bodies are normally aligned. Disc spaces are fairly well maintained. Mild degenerative disc disease at C5-6. The facets are normally aligned. There is mild bilateral bony foraminal narrowing at C5-6 due to uncinate spurring changes. No acute bony findings. No abnormal prevertebral soft tissue swelling. The C1-2 articulations are maintained. The lung apices are clear.  IMPRESSION: Degenerative disc disease mainly at C5-6 with bilateral bony foraminal narrowing.  No acute bony findings.   Electronically Signed   By: Loralie Champagne M.D.   On: 10/04/2013 14:44   Mr Laqueta Jean ZO Contrast  10/11/2013   CLINICAL DATA:  Swelling of the right upper and lower extremity. Worsening pain and numbness.  EXAM: MRI HEAD WITHOUT AND WITH CONTRAST  MRI CERVICAL SPINE WITHOUT AND WITH CONTRAST  TECHNIQUE: Multiplanar, multiecho pulse sequences of the brain and surrounding structures, and cervical spine, to include the craniocervical junction and cervicothoracic junction, were obtained without and with intravenous contrast.  CONTRAST:  20mL MULTIHANCE GADOBENATE DIMEGLUMINE 529 MG/ML IV SOLN  COMPARISON:  None.  FINDINGS: MRI HEAD FINDINGS  Diffusion imaging does not show any acute or subacute infarction. The brainstem and cerebellum are normal. The cerebral hemispheres show scattered foci of T2 and FLAIR signal  within the deep white matter consistent with chronic small vessel disease. No cortical or large vessel territory infarction. No mass lesion, hemorrhage, hydrocephalus or extra-axial collection. After contrast administration, no abnormal enhancement occurs.  MRI CERVICAL SPINE FINDINGS  Alignment is normal. Ample subarachnoid space surrounds the cervical spinal cord. No cord lesion. No abnormal enhancement.  C3-4 show shallow protrusion of disk material that narrows the ventral subarachnoid space but does not affect the cord. There is moderate foraminal stenosis bilaterally.  C5-6 shows spondylosis with endplate osteophytes and bulging of the disc. There is foraminal narrowing bilaterally of a moderate degree.  No significant osseous lesion. Benign hemangioma within the T1 vertebral body.  IMPRESSION: MRI brain: No acute finding. Chronic small-vessel changes within the hemispheric white matter.  MRI cervical spine: No cord lesion. 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   Electronically Signed   By: Paulina Fusi M.D.   On: 10/11/2013 20:04   Mr Cervical Spine W Wo Contrast  10/11/2013   CLINICAL DATA:  Swelling of the right upper and lower extremity. Worsening pain and numbness.  EXAM: MRI HEAD WITHOUT AND WITH CONTRAST  MRI CERVICAL SPINE WITHOUT AND WITH CONTRAST  TECHNIQUE: Multiplanar, multiecho pulse sequences of the brain and surrounding structures, and cervical spine, to include the craniocervical junction and cervicothoracic junction, were obtained without and with intravenous contrast.  CONTRAST:  27mL MULTIHANCE GADOBENATE DIMEGLUMINE 529 MG/ML IV SOLN  COMPARISON:  None.  FINDINGS: MRI HEAD FINDINGS  Diffusion imaging does not show any acute or subacute infarction. The brainstem and cerebellum are normal. The cerebral hemispheres show scattered foci of T2 and FLAIR signal within the deep white matter consistent with chronic small  vessel disease. No cortical or large vessel territory infarction. No mass lesion, hemorrhage, hydrocephalus or extra-axial collection. After contrast administration, no abnormal enhancement occurs.  MRI CERVICAL SPINE FINDINGS  Alignment is normal. Ample subarachnoid space surrounds the cervical spinal cord. No cord lesion. No abnormal enhancement.  C3-4 show shallow protrusion of disk material that narrows the ventral subarachnoid space but does not affect the cord. There is moderate foraminal stenosis bilaterally.  C5-6 shows spondylosis with endplate osteophytes and bulging of the disc. There is foraminal narrowing bilaterally of a moderate degree.  No significant osseous lesion. Benign hemangioma within the T1 vertebral body.  IMPRESSION: MRI brain: No acute finding. Chronic small-vessel changes within the hemispheric white matter.  MRI cervical spine: No cord lesion. 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   Electronically Signed   By: Paulina Fusi M.D.   On: 10/11/2013 20:04   US Venous Img Upper Uni Right  10/06/2013   CLINICAL DATA:  Hand swelling and pain.  EXAM: Right UPPER EXTREMITY VENOUS DOPPLER ULTRASOUND  TECHNIQUE: Gray-scale sonography with graded compression, as well as color Doppler and duplex ultrasound were performed to evaluate the upper extremity deep venous system from the level of the subclavian vein and including the jugular, axillary, basilic and upper cephalic vein. Spectral Doppler was utilized to evaluate flow at rest and with distal augmentation maneuvers.  COMPARISON:  None.  FINDINGS: Thrombus within deep veins:  None visualized.  Compressibility of deep veins:  Normal.  Duplex waveform respiratory phasicity:  Normal.  Duplex waveform response to augmentation:  Normal.  Venous reflux:  None visualized.  Other findings:  None visualized.  IMPRESSION: Normal exam.   Electronically Signed   By: Maisie Fus  Register    On: 10/06/2013 08:32   Dg Knee Complete 4 Views Right  10/12/2013   CLINICAL DATA:  Chronic right knee pain without history of injury.  EXAM: RIGHT KNEE - COMPLETE 4+ VIEW  COMPARISON:  None.  FINDINGS: The bones of the right knee appear mildly osteopenic. There is mild-to-moderate degenerative change  involving all 3 joint compartments. There is prominent beaking of the medial tibial spine. No high-grade joint space narrowing is demonstrated. There is likely soft tissue calcification in the region of the insertion of the quadriceps tendon. No joint effusion is evident. There is some periosteal reaction along the lateral cortex of the proximal tibial metaphysis. The adjacent fibula exhibits no acute abnormality.  IMPRESSION: There is moderate ostial arthritic change of the right knee. There is no evidence of an acute fracture. There is periosteal reaction which may be acute or old adjacent to the lateral aspect of the proximal tibial metaphysis. Given the patient's symptoms and lack of significant trauma, follow-up MRI may be useful.   Electronically Signed   By: David  [place]   On: 10/12/2013 13:59   Dg Hand Complete Left  10/11/2013   CLINICAL DATA:  Polyarthritis.  Evaluate for joint destruction.  EXAM: LEFT HAND - COMPLETE 3+ VIEW  COMPARISON:  None.  FINDINGS: No evidence of acute or subacute fracture or dislocation. Moderate osseous demineralization. Mild joint space narrowing involving the IP joints of multiple fingers and the IP joint of the thumb. Mild joint space narrowing involving the trapezium-1st metacarpal joint at the wrist. No erosions.  IMPRESSION: No acute or subacute osseous abnormality. Osseous demineralization. Mild osteoarthritis.   Electronically Signed   By: Hulan Saas M.D.   On: 10/11/2013 22:34   Dg Hand Complete Right  10/11/2013   CLINICAL DATA:  Polyarthritis.  Evaluate for joint destruction.  EXAM: RIGHT HAND - COMPLETE 3+ VIEW  COMPARISON:  10/04/2013.  FINDINGS: No  evidence of acute or subacute fracture or dislocation. Moderate osseous demineralization. Mild joint space narrowing involving the IP joints of multiple fingers, the 1st MCP joint, and the IP joint of the thumb. No erosions.  IMPRESSION: No acute or subacute osseous abnormality. Osseous demineralization. Mild osteoarthritis.   Electronically Signed   By: Hulan Saas M.D.   On: 10/11/2013 22:32   Dg Hand Complete Right  10/04/2013   CLINICAL DATA:  Right hand pain.  EXAM: RIGHT HAND - COMPLETE 3+ VIEW  COMPARISON:  None.  FINDINGS: Mild to moderate osteoarthritic type degenerative changes involving the DIP and PIP joints of the fingers. No findings for under erosive arthropathy or acute abnormality.  IMPRESSION: No acute bony findings.  Mild degenerative changes.   Electronically Signed   By: Loralie Champagne M.D.   On: 10/04/2013 14:45   Dg Foot Complete Left  10/11/2013   CLINICAL DATA:  Polyarthritis  EXAM: LEFT FOOT - COMPLETE 3+ VIEW  COMPARISON:  None.  FINDINGS: The bones are osteopenic, but typical for age. No periarticular predominant osteopenia. Most notable joint narrowing is at the 1st MCP, where there is associated marginal osteophytes and no erosions. No erosions seen throughout the foot. No other bony destructive changes. No acute fracture or subluxation. Plantar and dorsal calcaneal enthesophytes, without erosion.  IMPRESSION: 1. No evidence of erosive arthritis. 2. 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   Electronically Signed   By: Tiburcio Pea M.D.   On: 10/11/2013 22:25   Dg Foot Complete Right  10/11/2013   CLINICAL DATA:  Polyarthritis.  Evaluate for joint destruction.  EXAM: RIGHT FOOT COMPLETE - 3+ VIEW  COMPARISON:  None.  FINDINGS: Bony loss involving the proximal aspect of the 1st proximal phalanx. The margins appear corticated and sclerotic. The 1st MCP joint is narrowed, and there is sclerosis and flattening of the 1st metatarsal head. The great toe is medially subluxed. No acute  erosions  are seen. Bones are osteopenic, but typical of age. No acute fracture.  IMPRESSION: 1. No evidence of acute erosive arthritis. 2. First MTP changes which could be postsurgical or from remote inflammatory arthritis.   Electronically Signed   By: Tiburcio PeaJonathan  Watts M.D.   On: 10/11/2013 22:23   Microbiology: No results found for this or any previous visit (from the past 240 hour(s)).   Labs: Basic Metabolic Panel:  Recent Labs Lab 10/11/13 1733  NA 138  K 3.8  CL 100  CO2 25  GLUCOSE 117*  BUN 16  CREATININE 0.97  CALCIUM 9.6   Liver Function Tests:  Recent Labs Lab 10/11/13 1733  AST 19  ALT 18  ALKPHOS 80  BILITOT 0.4  PROT 6.7  ALBUMIN 3.6   No results found for this basename: LIPASE, AMYLASE,  in the last 168 hours No results found for this basename: AMMONIA,  in the last 168 hours CBC:  Recent Labs Lab 10/11/13 1733  WBC 13.2*  NEUTROABS 10.2*  HGB 14.1  HCT 41.8  MCV 91.7  PLT 320   Cardiac Enzymes: No results found for this basename: CKTOTAL, CKMB, CKMBINDEX, TROPONINI,  in the last 168 hours BNP: BNP (last 3 results) No results found for this basename: PROBNP,  in the last 8760 hours CBG: No results found for this basename: GLUCAP,  in the last 168 hours  Signed:  Tazaria Dlugosz  [facility] 10/12/2013, 4:29 PM

## 2013-10-12 NOTE — Discharge Instructions (Signed)
Rheumatoid Arthritis Rheumatoid arthritis is a disease that causes pain, puffiness (swelling), and stiffness of the joints. It is a long-term (chronic) disease. It can affect the whole body, even the eyes and lungs.  HOME CARE  Stay active, but lessen activity when the disease gets worse.  Eat healthy foods.  Put heat on the affected joints when you wake up and before activity. Keep the heat on for as long as told by your doctor.  Put ice on the affected joints after activity or exercise.  Put ice in a plastic bag.  Place a towel between your skin and the bag.  Leave the ice on for 15-20 minutes, 03-04 times a day.  Take all medicine and other dietary pills (supplements) as told by your doctor.  Use a splint as told by your doctor. Splints help keep joints in a certain position to keep the joint working right.  Do not sleep with pillows under your knees.  Go to programs that can keep you updated on treatments and ways to deal with your disease. GET HELP RIGHT AWAY IF:  You have times where you pass out (faint).  You have times where you are really weak.  You suddenly have a hot, painful joint that feels worse than your normal joint ache.  You have chills.  You have a fever. MAKE SURE YOU:   Understand these instructions.  Will watch your condition.  Will get help right away if you are not doing well or get worse. Document Released: 12/10/2011 Document Reviewed: 12/10/2011 [facility] Patient Information 2014 [place], [place].   Arthralgia Your caregiver has diagnosed you as suffering from an arthralgia. Arthralgia means there is pain in a joint. This can come from many reasons including:  Bruising the joint which causes soreness (inflammation) in the joint.  Wear and tear on the joints which occur as we grow older (osteoarthritis).  Overusing the joint.  Various forms of arthritis.  Infections of the joint. Regardless of the cause of pain in your joint, most of  these different pains respond to anti-inflammatory drugs and rest. The exception to this is when a joint is infected, and these cases are treated with antibiotics, if it is a bacterial infection. HOME CARE INSTRUCTIONS   Rest the injured area for as long as directed by your caregiver. Then slowly start using the joint as directed by your caregiver and as the pain allows. Crutches as directed may be useful if the ankles, knees or hips are involved. If the knee was splinted or casted, continue use and care as directed. If an stretchy or elastic wrapping bandage has been applied today, it should be removed and re-applied every 3 to 4 hours. It should not be applied tightly, but firmly enough to keep swelling down. Watch toes and feet for swelling, bluish discoloration, coldness, numbness or excessive pain. If any of these problems (symptoms) occur, remove the ace bandage and re-apply more loosely. If these symptoms persist, contact your caregiver or return to this location.  For the first 24 hours, keep the injured extremity elevated on pillows while lying down.  Apply ice for 15-20 minutes to the sore joint every couple hours while awake for the first half day. Then 03-04 times per day for the first 48 hours. Put the ice in a plastic bag and place a towel between the bag of ice and your skin.  Wear any splinting, casting, elastic bandage applications, or slings as instructed.  Only take over-the-counter or prescription medicines  for pain, discomfort, or fever as directed by your caregiver. Do not use aspirin immediately after the injury unless instructed by your physician. Aspirin can cause increased bleeding and bruising of the tissues.  If you were given crutches, continue to use them as instructed and do not resume weight bearing on the sore joint until instructed. Persistent pain and inability to use the sore joint as directed for more than 2 to 3 days are warning signs indicating that you should see  a caregiver for a follow-up visit as soon as possible. Initially, a hairline fracture (break in bone) may not be evident on X-rays. Persistent pain and swelling indicate that further evaluation, non-weight bearing or use of the joint (use of crutches or slings as instructed), or further X-rays are indicated. X-rays may sometimes not show a small fracture until a week or 10 days later. Make a follow-up appointment with your own caregiver or one to whom we have referred you. A radiologist (specialist in reading X-rays) may read your X-rays. Make sure you know how you are to obtain your X-ray results. Do not assume everything is normal if you do not hear from [place]. SEEK MEDICAL CARE IF: Bruising, swelling, or pain increases. SEEK IMMEDIATE MEDICAL CARE IF:   Your fingers or toes are numb or blue.  The pain is not responding to medications and continues to stay the same or get worse.  The pain in your joint becomes severe.  You develop a fever over 102 F (38.9 C).  It becomes impossible to move or use the joint. MAKE SURE YOU:   Understand these instructions.  Will watch your condition.  Will get help right away if you are not doing well or get worse. Document Released: 09/17/2005 Document Revised: 12/10/2011 Document Reviewed: 05/05/2008 [facility] Patient Information 2014 [place], [place].  Arthritis, Nonspecific Arthritis is inflammation of a joint. This usually means pain, redness, warmth or swelling are present. One or more joints may be involved. There are a number of types of arthritis. Your caregiver may not be able to tell what type of arthritis you have right away. CAUSES  The most common cause of arthritis is the wear and tear on the joint (osteoarthritis). This causes damage to the cartilage, which can break down over time. The knees, hips, back and neck are most often affected by this type of arthritis. Other types of arthritis and common causes of joint pain include:  Sprains and  other injuries near the joint. Sometimes minor sprains and injuries cause pain and swelling that develop hours later.  Rheumatoid arthritis. This affects hands, feet and knees. It usually affects both sides of your body at the same time. It is often associated with chronic ailments, fever, weight loss and general weakness.  Crystal arthritis. Gout and pseudo gout can cause occasional acute severe pain, redness and swelling in the foot, ankle, or knee.  Infectious arthritis. Bacteria can get into a joint through a break in overlying skin. This can cause infection of the joint. Bacteria and viruses can also spread through the blood and affect your joints.  Drug, infectious and allergy reactions. Sometimes joints can become mildly painful and slightly swollen with these types of illnesses. SYMPTOMS   Pain is the main symptom.  Your joint or joints can also be red, swollen and warm or hot to the touch.  You may have a fever with certain types of arthritis, or even feel overall ill.  The joint with arthritis will hurt with movement.  Stiffness is present with some types of arthritis. DIAGNOSIS  Your caregiver will suspect arthritis based on your description of your symptoms and on your exam. Testing may be needed to find the type of arthritis:  Blood and sometimes urine tests.  X-ray tests and sometimes CT or MRI scans.  Removal of fluid from the joint (arthrocentesis) is done to check for bacteria, crystals or other causes. Your caregiver (or a specialist) will numb the area over the joint with a local anesthetic, and use a needle to remove joint fluid for examination. This procedure is only minimally uncomfortable.  Even with these tests, your caregiver may not be able to tell what kind of arthritis you have. Consultation with a specialist (rheumatologist) may be helpful. TREATMENT  Your caregiver will discuss with you treatment specific to your type of arthritis. If the specific type cannot  be determined, then the following general recommendations may apply. Treatment of severe joint pain includes:  Rest.  Elevation.  Anti-inflammatory medication (for example, ibuprofen) may be prescribed. Avoiding activities that cause increased pain.  Only take over-the-counter or prescription medicines for pain and discomfort as recommended by your caregiver.  Cold packs over an inflamed joint may be used for 10 to 15 minutes every hour. Hot packs sometimes feel better, but do not use overnight. Do not use hot packs if you are diabetic without your caregiver's permission.  A cortisone shot into arthritic joints may help reduce pain and swelling.  Any acute arthritis that gets worse over the next 1 to 2 days needs to be looked at to be sure there is no joint infection. Long-term arthritis treatment involves modifying activities and lifestyle to reduce joint stress jarring. This can include weight loss. Also, exercise is needed to nourish the joint cartilage and remove waste. This helps keep the muscles around the joint strong. HOME CARE INSTRUCTIONS   Do not take aspirin to relieve pain if gout is suspected. This elevates uric acid levels.  Only take over-the-counter or prescription medicines for pain, discomfort or fever as directed by your caregiver.  Rest the joint as much as possible.  If your joint is swollen, keep it elevated.  Use crutches if the painful joint is in your leg.  Drinking plenty of fluids may help for certain types of arthritis.  Follow your caregiver's dietary instructions.  Try low-impact exercise such as:  Swimming.  Water aerobics.  Biking.  Walking.  Morning stiffness is often relieved by a warm shower.  Put your joints through regular range-of-motion. SEEK MEDICAL CARE IF:   You do not feel better in 24 hours or are getting worse.  You have side effects to medications, or are not getting better with treatment. SEEK IMMEDIATE MEDICAL CARE  IF:   You have a fever.  You develop severe joint pain, swelling or redness.  Many joints are involved and become painful and swollen.  There is severe back pain and/or leg weakness.  You have loss of bowel or bladder control. Document Released: 10/25/2004 Document Revised: 12/10/2011 Document Reviewed: 11/10/2008 [facility] Patient Information 2014 [place], [place].

## 2013-10-12 NOTE — Evaluation (Signed)
Physical Therapy Evaluation Patient Details Name: Kristina May MRN: [id number] DOB: 1936-04-24 Today's Date: 10/12/2013 Time: 4098-1191 PT Time Calculation (min): 22 min  PT Assessment / Plan / Recommendation History of Present Illness  Pt is a 78 y/o female admitted with polyarthritis. Pt is s/p knee injection.   Clinical Impression  Patient evaluated by Physical Therapy with no further acute PT needs identified. All education has been completed and the patient has no further questions. Care manager to speak with pt and family regarding a RW for home. See below for any follow-up Physial Therapy or equipment needs. PT is signing off. Thank you for this referral.     PT Assessment  Patent does not need any further PT services    Follow Up Recommendations  No PT follow up    Does the patient have the potential to tolerate intense rehabilitation      Barriers to Discharge        Equipment Recommendations  Rolling walker with 5" wheels    Recommendations for Other Services     Frequency      Precautions / Restrictions Precautions Precautions: Fall Restrictions Weight Bearing Restrictions: No   Pertinent Vitals/Pain Pt reports pain has lessened since knee injection.      Mobility  Bed Mobility Overal bed mobility: Modified Independent General bed mobility comments: No physical assist required. Transfers Overall transfer level: Modified independent Equipment used: Rolling walker (2 wheeled) General transfer comment: VC's for hand placement and safety awareness.  Ambulation/Gait Ambulation/Gait assistance: Supervision Ambulation Distance (Feet): 75 Feet Assistive device: Rolling walker (2 wheeled) Gait Pattern/deviations: Step-through pattern;Decreased stride length;Antalgic Gait velocity: Decreased Gait velocity interpretation: Below normal speed for age/gender General Gait Details: VC's for sequencing and safety awareness with the RW. Pt initially refused using RW  and insisted on standard walker. After education on safety and energy conservation, pt switched to RW use with much improved gait technique.     Exercises     PT Diagnosis: Difficulty walking;Acute pain  PT Problem List: Decreased strength;Decreased range of motion;Decreased activity tolerance;Decreased balance;Decreased mobility;Decreased knowledge of use of DME;Decreased safety awareness;Pain PT Treatment Interventions:       PT Goals(Current goals can be found in the care plan section) Acute Rehab PT Goals Patient Stated Goal: To return home PT Goal Formulation: No goals set, d/c therapy  Visit Information  Last PT Received On: 10/12/13 Assistance Needed: +1 History of Present Illness: Pt is a 78 y/o female admitted with polyarthritis. Pt is s/p knee injection.        Prior Functioning  Home Living Family/patient expects to be discharged to:: Private residence Living Arrangements: Children Available Help at Discharge: Family;Available 24 hours/day Type of Home: House Home Access: Stairs to enter [employer] of Steps: 3 Entrance Stairs-Rails: Left Home Layout: One level Home Equipment: Cane - single point Prior Function Level of Independence: Independent Communication Communication: No difficulties Dominant Hand: Right    Cognition  Cognition Arousal/Alertness: Awake/alert Behavior During Therapy: WFL for tasks assessed/performed Overall Cognitive Status: Within Functional Limits for tasks assessed    Extremity/Trunk Assessment Upper Extremity Assessment Upper Extremity Assessment: Overall WFL for tasks assessed Lower Extremity Assessment Lower Extremity Assessment: Generalized weakness (Due to pain from arthritis flare up) Cervical / Trunk Assessment Cervical / Trunk Assessment: Kyphotic   Balance Balance Overall balance assessment: No apparent balance deficits (not formally assessed)  End of Session PT - End of Session Activity Tolerance: Patient  tolerated treatment well Patient left: in chair;with call  bell/phone within reach;with family/visitor present Nurse Communication: Mobility status  GP Functional Assessment Tool Used: Clinical judgement Functional Limitation: Mobility: Walking and moving around Mobility: Walking and Moving Around Current Status ([id number]): At least 1 percent but less than 20 percent impaired, limited or restricted Mobility: Walking and Moving Around Goal Status [phone]): At least 1 percent but less than 20 percent impaired, limited or restricted Mobility: Walking and Moving Around Discharge Status [phone]): At least 1 percent but less than 20 percent impaired, limited or restricted   Ruthann Cancer 10/12/2013, 4:51 PM  Ruthann Cancer, PT, DPT [phone]

## 2013-10-12 NOTE — Consult Note (Signed)
Reason for Consult: right knee pain Referring Physician: hospitalist  Cadence Minton is an 78 y.o. female.  HPI: Pt with recent onset of severe joint pain in bilateral hands and right knee.  Seen in ED 09/16/2013 and 10/04/2013 for evaluation.  Thus far doppler study for LE DVT was negative for clot and showed Bakers cyst right knee.  She has completed a steroid dose pak.  Persistent pain despite treatment with oral steroids and rest.  No history of RA, gout or other autoimmune disease known to patient.  No previous knee injury.  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  No previous knee surgeries or injections.  Now with swelling of right knee with pain on weight bearing and ROM. Has not tried ice or heat.  History of IA steroid injections to hip for DJD with successful relief.  Currently using glucosamine, naproxen and percocet for pain in addition to oral steroids.  Has not seen a Rheumatologist.   Radiographs of the right knee with significant patellofemoral arthritis with large spurs off the anterior femur and inferior pole of the patellla.   Evidence of partial rupture of quad tendon with large soft tissue calcification of the insertion at the quad tendon. Exam shows pt to have quad control without evidence of acute rupture. No sign or symptoms of infection of right knee.  No effusion.  Past Medical History  Diagnosis Date  . COPD (chronic obstructive pulmonary disease)   . Hypertension   . Thyroid disease     Past Surgical History  Procedure Laterality Date  . Foot surgery      Family History  Problem Relation Age of Onset  . Multiple sclerosis      Social History:  reports that she has quit smoking. She does not have any smokeless tobacco history on file. She reports that she drinks alcohol. She reports that she does not use illicit drugs.  Allergies:  Allergies  Allergen Reactions  . Latex Other (See Comments)    Causes blisters  . Betamethasone Hives  . Dexamethasone Hives  .  Tape Other (See Comments)    Caused blisters   . Tylenol [Acetaminophen] Other (See Comments)    Doesn't work for patient   . Claritin [Loratadine] Other (See Comments)    Doesn't work for patient    Medications: I have reviewed the patient's current medications.  Results for orders placed during the hospital encounter of 10/11/13 (from the past 48 hour(s))  CBC WITH DIFFERENTIAL     Status: Abnormal   Collection Time    10/11/13  5:33 PM      Result Value Range   WBC 13.2 (*) 4.0 - 10.5 K/uL   RBC 4.56  3.87 - 5.11 MIL/uL   Hemoglobin 14.1  12.0 - 15.0 g/dL   HCT 41.8  36.0 - 46.0 %   MCV 91.7  78.0 - 100.0 fL   MCH 30.9  26.0 - 34.0 pg   MCHC 33.7  30.0 - 36.0 g/dL   RDW 13.6  11.5 - 15.5 %   Platelets 320  150 - 400 K/uL   Neutrophils Relative % 77  43 - 77 %   Neutro Abs 10.2 (*) 1.7 - 7.7 K/uL   Lymphocytes Relative 14  12 - 46 %   Lymphs Abs 1.8  0.7 - 4.0 K/uL   Monocytes Relative 8  3 - 12 %   Monocytes Absolute 1.0  0.1 - 1.0 K/uL   Eosinophils Relative 2  0 -  5 %   Eosinophils Absolute 0.2  0.0 - 0.7 K/uL   Basophils Relative 0  0 - 1 %   Basophils Absolute 0.0  0.0 - 0.1 K/uL  COMPREHENSIVE METABOLIC PANEL     Status: Abnormal   Collection Time    10/11/13  5:33 PM      Result Value Range   Sodium 138  137 - 147 mEq/L   Potassium 3.8  3.7 - 5.3 mEq/L   Chloride 100  96 - 112 mEq/L   CO2 25  19 - 32 mEq/L   Glucose, Bld 117 (*) 70 - 99 mg/dL   BUN 16  6 - 23 mg/dL   Creatinine, Ser 0.97  0.50 - 1.10 mg/dL   Calcium 9.6  8.4 - 10.5 mg/dL   Total Protein 6.7  6.0 - 8.3 g/dL   Albumin 3.6  3.5 - 5.2 g/dL   AST 19  0 - 37 U/L   ALT 18  0 - 35 U/L   Alkaline Phosphatase 80  39 - 117 U/L   Total Bilirubin 0.4  0.3 - 1.2 mg/dL   GFR calc non Af Amer 56 (*) >90 mL/min   GFR calc Af Amer 65 (*) >90 mL/min   Comment: (NOTE)     The eGFR has been calculated using the CKD EPI equation.     This calculation has not been validated in all clinical situations.      eGFR's persistently <90 mL/min signify possible Chronic Kidney     Disease.  SEDIMENTATION RATE     Status: Abnormal   Collection Time    10/11/13  5:33 PM      Result Value Range   Sed Rate 25 (*) 0 - 22 mm/hr  RHEUMATOID FACTOR     Status: Abnormal   Collection Time    10/11/13 11:45 PM      Result Value Range   Rheumatoid Factor 215 (*) <=14 IU/mL   Comment: (NOTE)     Result confirmed by automatic dilution.                             Interpretive Table                        Low Positive: 15 - 41 IU/mL                        High Positive:  >= 42 IU/mL     In addition to the RF result, and clinical symptoms including joint     involvement, the 2010 ACR Classification Criteria for     scoring/diagnosing Rheumatoid Arthritis include the results of the     following tests:  CRP (94709), ESR (15010), and CCP (APCA) (62836).     www.rheumatology.org/practice/clinical/classification/ra/ra_2010.asp     Performed at [facility]     Status: Abnormal   Collection Time    10/11/13 11:45 PM      Result Value Range   CRP 3.7 (*) <0.60 mg/dL   Comment: Performed at [facility]     Status: None   Collection Time    10/11/13 11:45 PM      Result Value Range   HCV Ab NEGATIVE  NEGATIVE   Comment: Performed at [facility]    Mr Jeri Cos Wo Contrast  10/11/2013  CLINICAL DATA:  Swelling of the right upper and lower extremity. Worsening pain and numbness.  EXAM: MRI HEAD WITHOUT AND WITH CONTRAST  MRI CERVICAL SPINE WITHOUT AND WITH CONTRAST  TECHNIQUE: Multiplanar, multiecho pulse sequences of the brain and surrounding structures, and cervical spine, to include the craniocervical junction and cervicothoracic junction, were obtained without and with intravenous contrast.  CONTRAST:  44m MULTIHANCE GADOBENATE DIMEGLUMINE 529 MG/ML IV SOLN  COMPARISON:  None.  FINDINGS: MRI HEAD FINDINGS  Diffusion imaging does not show any  acute or subacute infarction. The brainstem and cerebellum are normal. The cerebral hemispheres show scattered foci of T2 and FLAIR signal within the deep white matter consistent with chronic small vessel disease. No cortical or large vessel territory infarction. No mass lesion, hemorrhage, hydrocephalus or extra-axial collection. After contrast administration, no abnormal enhancement occurs.  MRI CERVICAL SPINE FINDINGS  Alignment is normal. Ample subarachnoid space surrounds the cervical spinal cord. No cord lesion. No abnormal enhancement.  C3-4 show shallow protrusion of disk material that narrows the ventral subarachnoid space but does not affect the cord. There is moderate foraminal stenosis bilaterally.  C5-6 shows spondylosis with endplate osteophytes and bulging of the disc. There is foraminal narrowing bilaterally of a moderate degree.  No significant osseous lesion. Benign hemangioma within the T1 vertebral body.  IMPRESSION: MRI brain: No acute finding. Chronic small-vessel changes within the hemispheric white matter.  MRI cervical spine: No cord lesion. 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   Electronically Signed   By: MNelson ChimesM.D.   On: 10/11/2013 20:04   Mr Cervical Spine W Wo Contrast  10/11/2013   CLINICAL DATA:  Swelling of the right upper and lower extremity. Worsening pain and numbness.  EXAM: MRI HEAD WITHOUT AND WITH CONTRAST  MRI CERVICAL SPINE WITHOUT AND WITH CONTRAST  TECHNIQUE: Multiplanar, multiecho pulse sequences of the brain and surrounding structures, and cervical spine, to include the craniocervical junction and cervicothoracic junction, were obtained without and with intravenous contrast.  CONTRAST:  221mMULTIHANCE GADOBENATE DIMEGLUMINE 529 MG/ML IV SOLN  COMPARISON:  None.  FINDINGS: MRI HEAD FINDINGS  Diffusion imaging does not show any acute or subacute infarction. The brainstem and cerebellum  are normal. The cerebral hemispheres show scattered foci of T2 and FLAIR signal within the deep white matter consistent with chronic small vessel disease. No cortical or large vessel territory infarction. No mass lesion, hemorrhage, hydrocephalus or extra-axial collection. After contrast administration, no abnormal enhancement occurs.  MRI CERVICAL SPINE FINDINGS  Alignment is normal. Ample subarachnoid space surrounds the cervical spinal cord. No cord lesion. No abnormal enhancement.  C3-4 show shallow protrusion of disk material that narrows the ventral subarachnoid space but does not affect the cord. There is moderate foraminal stenosis bilaterally.  C5-6 shows spondylosis with endplate osteophytes and bulging of the disc. There is foraminal narrowing bilaterally of a moderate degree.  No significant osseous lesion. Benign hemangioma within the T1 vertebral body.  IMPRESSION: MRI brain: No acute finding. Chronic small-vessel changes within the hemispheric white matter.  MRI cervical spine: No cord lesion. 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   Electronically Signed   By: MaNelson Chimes.D.   On: 10/11/2013 20:04   Dg Knee Complete 4 Views Right  10/12/2013   CLINICAL DATA:  Chronic right knee pain without history of injury.  EXAM: RIGHT KNEE - COMPLETE 4+ VIEW  COMPARISON:  None.  FINDINGS: The bones of the right knee appear mildly osteopenic. There is mild-to-moderate degenerative change involving all 3 joint compartments. There is prominent beaking of the medial tibial spine. No high-grade joint space narrowing is demonstrated. There is likely soft tissue calcification in the region of the insertion of the quadriceps tendon. No joint effusion is evident. There is some periosteal reaction along the lateral cortex of the proximal tibial metaphysis. The adjacent fibula exhibits no acute abnormality.  IMPRESSION: There is moderate ostial  arthritic change of the right knee. There is no evidence of an acute fracture. There is periosteal reaction which may be acute or old adjacent to the lateral aspect of the proximal tibial metaphysis. Given the patient's symptoms and lack of significant trauma, follow-up MRI may be useful.   Electronically Signed   By: David  Martinique   On: 10/12/2013 13:59   Dg Hand Complete Left  10/11/2013   CLINICAL DATA:  Polyarthritis.  Evaluate for joint destruction.  EXAM: LEFT HAND - COMPLETE 3+ VIEW  COMPARISON:  None.  FINDINGS: No evidence of acute or subacute fracture or dislocation. Moderate osseous demineralization. Mild joint space narrowing involving the IP joints of multiple fingers and the IP joint of the thumb. Mild joint space narrowing involving the trapezium-1st metacarpal joint at the wrist. No erosions.  IMPRESSION: No acute or subacute osseous abnormality. Osseous demineralization. Mild osteoarthritis.   Electronically Signed   By: Evangeline Dakin M.D.   On: 10/11/2013 22:34   Dg Hand Complete Right  10/11/2013   CLINICAL DATA:  Polyarthritis.  Evaluate for joint destruction.  EXAM: RIGHT HAND - COMPLETE 3+ VIEW  COMPARISON:  10/04/2013.  FINDINGS: No evidence of acute or subacute fracture or dislocation. Moderate osseous demineralization. Mild joint space narrowing involving the IP joints of multiple fingers, the 1st MCP joint, and the IP joint of the thumb. No erosions.  IMPRESSION: No acute or subacute osseous abnormality. Osseous demineralization. Mild osteoarthritis.   Electronically Signed   By: Evangeline Dakin M.D.   On: 10/11/2013 22:32   Dg Foot Complete Left  10/11/2013   CLINICAL DATA:  Polyarthritis  EXAM: LEFT FOOT - COMPLETE 3+ VIEW  COMPARISON:  None.  FINDINGS: The bones are osteopenic, but typical for age. No periarticular predominant osteopenia. Most notable joint narrowing is at the 1st MCP, where there is associated marginal osteophytes and no erosions. No erosions seen  throughout the foot. No other bony destructive changes. No acute fracture or subluxation. Plantar and dorsal calcaneal enthesophytes, without erosion.  IMPRESSION: 1. No evidence of erosive arthritis. 2. 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   Electronically Signed   By: Jorje Guild M.D.   On: 10/11/2013 22:25   Dg Foot Complete Right  10/11/2013   CLINICAL DATA:  Polyarthritis.  Evaluate for joint destruction.  EXAM: RIGHT FOOT COMPLETE - 3+ VIEW  COMPARISON:  None.  FINDINGS: Bony loss involving the proximal aspect of the 1st proximal phalanx. The margins appear corticated and sclerotic. The 1st MCP joint is narrowed, and there is sclerosis and flattening of the 1st metatarsal head. The great toe is medially subluxed. No acute erosions are seen. Bones are osteopenic, but typical of age. No acute fracture.  IMPRESSION: 1. No evidence of acute erosive arthritis. 2. First MTP changes which could be postsurgical or from remote inflammatory arthritis.   Electronically Signed   By: Jorje Guild M.D.   On: 10/11/2013 22:23    Review of Systems  Constitutional: Negative for fever, chills  and malaise/fatigue.  Cardiovascular: Positive for leg swelling.       Chronic  Musculoskeletal: Positive for joint pain and myalgias.       Hands and arms aching initially.  Now just hands and cannot make fist or grip secondary to joints of hand painful. No shoulder pain. Now with painful left knee. Worse with weight bearing.  Skin: Negative for itching and rash.  Neurological: Negative for sensory change and focal weakness.       Tingling in finger tips.  No sensory change in the feet.   Blood pressure 152/43, pulse 67, temperature 98.2 F (36.8 C), temperature source Oral, resp. rate 18, height _0  (1.676 m), weight 102.513 kg (226 lb), SpO2 97.00%. Physical Exam  Constitutional: She appears well-developed and well-nourished. No distress.  Musculoskeletal:  Crepitus with ROM of right knee.  Tender over patellofemoral  joint.  Able to straight leg raise and contract quad.  No ligamentous instability with valgus and varus stress.  - Lachman, - anterior drawer.  Trace edema pretibial.  Pain with full extension of knee.  Flexion to 90 degrees. Diffuse edema of MCP joint of hands bilaterally.  No deformities.  Tender with motion of joints.    Assessment/Plan: 1.  End stage patellofemoral osteoarthritis right knee.  2.  Agree with evaluation for autoimmune or reactive arthritis and need for rheumatology consult. This may be done on outpt . Labs reviewed with Dr Lorin Mercy and a copy provided for pt.    PLAN:  IA injection to right knee joint.  Ice packs.  Continue with treatment with NSAIDS for knee. Will need orthopedic follow up to monitor knee symptoms as she will eventually need surgery for TKA.  For now proceed with activity as tolerated with right knee.  Local treatment with ice and heat.  May benefit from Voltaren gel to knee also outpt.   Agree with rheumatology consult. May need other meds and DMARDS for treatment.    Procedure:  After informed verbal consent, pts right knee was prepped thoroughly with betadine.  Knee was injected with 25% marcaine plain, 8cc and depomedrol 52m/ml ,2cc.  Betadine cleansed from skin with alcohol swab and bandaid applied.  Pt tolerated the injection well.    Rynell Ciotti M 10/12/2013, 2:55 PM

## 2013-10-12 NOTE — Progress Notes (Signed)
[facility] PROGRESS NOTE  Kristina May EXB:[id number] DOB: 06-Jan-1936 DOA: November 08, 2013 PCP: No primary provider on file.  Assessment/Plan: Polyarthritis   1. Symmetric polyarthritis - effecting the MCP joints on hands, knees. Highly suspicious for autoimmune or reactive arthritis. Work up ordered thus far includes RF, CCP, CRP, ANA, HCV, Lyme, B19. DDx is broad and includes but not limited to: RA, SLE, viral arthritis, Lyme, PMR, among others. Likely will wish to get Rheumatology involved to determine best course of treatment. Elevated Rheumatoid Factor suggesting RA, elevated CRP. Pt was counseled that she needs outpatient rheumatology evaluation.  2. Acute on Chronic Right knee pain - check xray of right knee, orthopedics consulted (Dr. Ophelia Charter), PT evaluation ordered.  3. Elevated WBC - pt has been on steroids (just completed a course)  HPI/Subjective: Pt still has pain in her hands.  Her right knee really hurts and she is having trouble standing.   Objective: Filed Vitals:   10/12/13 1014  BP: 152/43  Pulse:   Temp:   Resp:     Intake/Output Summary (Last 24 hours) at 10/12/13 1153 Last data filed at 10/12/13 0000  Gross per 24 hour  Intake    240 ml  Output      0 ml  Net    240 ml   Filed Weights   11/08/13 2300  Weight: 226 lb (102.513 kg)    Exam:   General:  Awake,alert, no distress  Cardiovascular: normal s1, s2 sounds   Respiratory: BBS clear to ausc  Abdomen: soft, nondistended  Musculoskeletal: swollen right knee, swollen MCP joints bilateral hands with ulnar deviation   Data Reviewed: Basic Metabolic Panel:  Recent Labs Lab 11/08/2013 1733  NA 138  K 3.8  CL 100  CO2 25  GLUCOSE 117*  BUN 16  CREATININE 0.97  CALCIUM 9.6   Liver Function Tests:  Recent Labs Lab Nov 08, 2013 1733  AST 19  ALT 18  ALKPHOS 80  BILITOT 0.4  PROT 6.7  ALBUMIN 3.6   No results found for this basename: LIPASE, AMYLASE,  in the last 168 hours No  results found for this basename: AMMONIA,  in the last 168 hours CBC:  Recent Labs Lab 2013-11-08 1733  WBC 13.2*  NEUTROABS 10.2*  HGB 14.1  HCT 41.8  MCV 91.7  PLT 320   Cardiac Enzymes: No results found for this basename: CKTOTAL, CKMB, CKMBINDEX, TROPONINI,  in the last 168 hours BNP (last 3 results) No results found for this basename: PROBNP,  in the last 8760 hours CBG: No results found for this basename: GLUCAP,  in the last 168 hours  No results found for this or any previous visit (from the past 240 hour(s)).   Studies: Mr Lodema Pilot Contrast  08-Nov-2013   CLINICAL DATA:  Swelling of the right upper and lower extremity. Worsening pain and numbness.  EXAM: MRI HEAD WITHOUT AND WITH CONTRAST  MRI CERVICAL SPINE WITHOUT AND WITH CONTRAST  TECHNIQUE: Multiplanar, multiecho pulse sequences of the brain and surrounding structures, and cervical spine, to include the craniocervical junction and cervicothoracic junction, were obtained without and with intravenous contrast.  CONTRAST:  47mL MULTIHANCE GADOBENATE DIMEGLUMINE 529 MG/ML IV SOLN  COMPARISON:  None.  FINDINGS: MRI HEAD FINDINGS  Diffusion imaging does not show any acute or subacute infarction. The brainstem and cerebellum are normal. The cerebral hemispheres show scattered foci of T2 and FLAIR signal within the deep white matter consistent with chronic small vessel disease. No cortical or large  vessel territory infarction. No mass lesion, hemorrhage, hydrocephalus or extra-axial collection. After contrast administration, no abnormal enhancement occurs.  MRI CERVICAL SPINE FINDINGS  Alignment is normal. Ample subarachnoid space surrounds the cervical spinal cord. No cord lesion. No abnormal enhancement.  C3-4 show shallow protrusion of disk material that narrows the ventral subarachnoid space but does not affect the cord. There is moderate foraminal stenosis bilaterally.  C5-6 shows spondylosis with endplate osteophytes and bulging of  the disc. There is foraminal narrowing bilaterally of a moderate degree.  No significant osseous lesion. Benign hemangioma within the T1 vertebral body.  IMPRESSION: MRI brain: No acute finding. Chronic small-vessel changes within the hemispheric white matter.  MRI cervical spine: No cord lesion. 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   Electronically Signed   By: Paulina Fusi M.D.   On: 10/11/2013 20:04   Mr Cervical Spine W Wo Contrast  10/11/2013   CLINICAL DATA:  Swelling of the right upper and lower extremity. Worsening pain and numbness.  EXAM: MRI HEAD WITHOUT AND WITH CONTRAST  MRI CERVICAL SPINE WITHOUT AND WITH CONTRAST  TECHNIQUE: Multiplanar, multiecho pulse sequences of the brain and surrounding structures, and cervical spine, to include the craniocervical junction and cervicothoracic junction, were obtained without and with intravenous contrast.  CONTRAST:  43mL MULTIHANCE GADOBENATE DIMEGLUMINE 529 MG/ML IV SOLN  COMPARISON:  None.  FINDINGS: MRI HEAD FINDINGS  Diffusion imaging does not show any acute or subacute infarction. The brainstem and cerebellum are normal. The cerebral hemispheres show scattered foci of T2 and FLAIR signal within the deep white matter consistent with chronic small vessel disease. No cortical or large vessel territory infarction. No mass lesion, hemorrhage, hydrocephalus or extra-axial collection. After contrast administration, no abnormal enhancement occurs.  MRI CERVICAL SPINE FINDINGS  Alignment is normal. Ample subarachnoid space surrounds the cervical spinal cord. No cord lesion. No abnormal enhancement.  C3-4 show shallow protrusion of disk material that narrows the ventral subarachnoid space but does not affect the cord. There is moderate foraminal stenosis bilaterally.  C5-6 shows spondylosis with endplate osteophytes and bulging of the disc. There is foraminal narrowing bilaterally of a  moderate degree.  No significant osseous lesion. Benign hemangioma within the T1 vertebral body.  IMPRESSION: MRI brain: No acute finding. Chronic small-vessel changes within the hemispheric white matter.  MRI cervical spine: No cord lesion. 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   Electronically Signed   By: Paulina Fusi M.D.   On: 10/11/2013 20:04   Dg Hand Complete Left  10/11/2013   CLINICAL DATA:  Polyarthritis.  Evaluate for joint destruction.  EXAM: LEFT HAND - COMPLETE 3+ VIEW  COMPARISON:  None.  FINDINGS: No evidence of acute or subacute fracture or dislocation. Moderate osseous demineralization. Mild joint space narrowing involving the IP joints of multiple fingers and the IP joint of the thumb. Mild joint space narrowing involving the trapezium-1st metacarpal joint at the wrist. No erosions.  IMPRESSION: No acute or subacute osseous abnormality. Osseous demineralization. Mild osteoarthritis.   Electronically Signed   By: Hulan Saas M.D.   On: 10/11/2013 22:34   Dg Hand Complete Right  10/11/2013   CLINICAL DATA:  Polyarthritis.  Evaluate for joint destruction.  EXAM: RIGHT HAND - COMPLETE 3+ VIEW  COMPARISON:  10/04/2013.  FINDINGS: No evidence of acute or subacute fracture or dislocation. Moderate osseous demineralization. Mild joint space narrowing involving the IP joints  of multiple fingers, the 1st MCP joint, and the IP joint of the thumb. No erosions.  IMPRESSION: No acute or subacute osseous abnormality. Osseous demineralization. Mild osteoarthritis.   Electronically Signed   By: Hulan Saas M.D.   On: 10/11/2013 22:32   Dg Foot Complete Left  10/11/2013   CLINICAL DATA:  Polyarthritis  EXAM: LEFT FOOT - COMPLETE 3+ VIEW  COMPARISON:  None.  FINDINGS: The bones are osteopenic, but typical for age. No periarticular predominant osteopenia. Most notable joint narrowing is at the 1st MCP, where there is  associated marginal osteophytes and no erosions. No erosions seen throughout the foot. No other bony destructive changes. No acute fracture or subluxation. Plantar and dorsal calcaneal enthesophytes, without erosion.  IMPRESSION: 1. No evidence of erosive arthritis. 2. 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   Electronically Signed   By: Tiburcio Pea M.D.   On: 10/11/2013 22:25   Dg Foot Complete Right  10/11/2013   CLINICAL DATA:  Polyarthritis.  Evaluate for joint destruction.  EXAM: RIGHT FOOT COMPLETE - 3+ VIEW  COMPARISON:  None.  FINDINGS: Bony loss involving the proximal aspect of the 1st proximal phalanx. The margins appear corticated and sclerotic. The 1st MCP joint is narrowed, and there is sclerosis and flattening of the 1st metatarsal head. The great toe is medially subluxed. No acute erosions are seen. Bones are osteopenic, but typical of age. No acute fracture.  IMPRESSION: 1. No evidence of acute erosive arthritis. 2. First MTP changes which could be postsurgical or from remote inflammatory arthritis.   Electronically Signed   By: Tiburcio Pea M.D.   On: 10/11/2013 22:23    Scheduled Meds: . heparin  5,000 Units Subcutaneous Q8H  . levothyroxine  125 mcg Oral QAC breakfast  . methylPREDNISolone acetate  80 mg Intra-articular Once  . metoprolol tartrate  100 mg Oral Daily  . metoprolol tartrate  50 mg Oral QHS  . mometasone-formoterol  2 puff Inhalation BID  . multivitamin with minerals  1 tablet Oral Daily  . simvastatin  20 mg Oral q1800  . tiotropium  18 mcg Inhalation Daily   Continuous Infusions:   Active Problems:   Polyarthritis   [place] Production designer, theatre/television/film [phone]. If 7PM-7AM, please contact night-coverage at [email or web address], password [facility] 10/12/2013, 11:53 AM  LOS: 1 day

## 2013-10-13 ENCOUNTER — Encounter: Payer: Self-pay | Admitting: Family Medicine

## 2013-10-13 LAB — CYCLIC CITRUL PEPTIDE ANTIBODY, IGG: Cyclic Citrullin Peptide Ab: 237.1 U/mL — ABNORMAL HIGH (ref 0.0–5.0)

## 2013-10-13 LAB — ANA: Anti Nuclear Antibody(ANA): NEGATIVE

## 2013-10-13 LAB — B. BURGDORFI ANTIBODIES: B burgdorferi Ab IgG+IgM: 0.04 {ISR}

## 2013-10-13 NOTE — Progress Notes (Signed)
Quick Note:  Will forward results to patient's PCP ______

## 2013-10-13 NOTE — Progress Notes (Addendum)
Cuadrado, Natha Female, 78 y.o., [birth year]    Cyclic Citrullin Peptide Ab 0.0 - 5.0 U/mL 237.1 (H)       Comments: (NOTE)                          Interpretive Table                     Low Positive:  5.1 - 14.9 IU/mL                     High Positive:  >= 15.0 IU/mL In addition to the CCP (APCA) result, and clinical symptoms including joint involvement, the 2010 ACR Classification Criteria for scoring/diagnosing Rheumatoid Arthritis include the results of the following tests:  RF (33354), CRP (56256), and ESR (15010). www.rheumatology.org/practice/clinical/classification/ra/ra_2010.asp Performed at [id number]      Specimen Collected: 10/11/13 11:45 PM Last Resulted: 10/13/13 12:04 PM                    B. burgdorfi antibodies  Status: Final result     Visible to patient: This result is not viewable by the patient.     Next appt: None              Range 2d ago     B burgdorferi Ab IgG+IgM ISR 0.04       Comments: (NOTE) Antibody to Borrelia burgdorferi not detected.   ISR = Immune Status Ratio             <0.90         ISR       Negative             0.90 - 1.09   ISR       Equivocal             >=1.10        ISR       Positive Performed at [id number]      Specimen Collected: 10/11/13 11:45 PM Last Resulted: 10/13/13 12:05 PM

## 2013-10-14 LAB — PARVOVIRUS B19 ANTIBODY, IGG AND IGM
PAROVIRUS B19 IGM ABS: 0.1 {index} (ref <0.9)
Parovirus B19 IgG Abs: 1.3 index — ABNORMAL HIGH (ref <0.9)

## 2018-02-10 ENCOUNTER — Encounter: Payer: Self-pay | Admitting: *Deleted

## 2018-02-14 DIAGNOSIS — I4891 Unspecified atrial fibrillation: Secondary | ICD-10-CM | POA: Insufficient documentation

## 2018-02-14 DIAGNOSIS — J449 Chronic obstructive pulmonary disease, unspecified: Secondary | ICD-10-CM | POA: Insufficient documentation

## 2019-05-11 ENCOUNTER — Encounter (HOSPITAL_COMMUNITY): Payer: Self-pay | Admitting: Emergency Medicine

## 2019-05-11 ENCOUNTER — Emergency Department (HOSPITAL_COMMUNITY): Payer: Medicare Other

## 2019-05-11 ENCOUNTER — Other Ambulatory Visit: Payer: Self-pay

## 2019-05-11 ENCOUNTER — Inpatient Hospital Stay (HOSPITAL_COMMUNITY)
Admission: EM | Admit: 2019-05-11 | Discharge: 2019-05-26 | DRG: 641 | Disposition: A | Discharge status: Home Health | Payer: Medicare Other | Attending: Internal Medicine | Admitting: Internal Medicine

## 2019-05-11 DIAGNOSIS — E876 Hypokalemia: Secondary | ICD-10-CM | POA: Diagnosis not present

## 2019-05-11 DIAGNOSIS — Z751 Person awaiting admission to adequate facility elsewhere: Secondary | ICD-10-CM

## 2019-05-11 DIAGNOSIS — R062 Wheezing: Secondary | ICD-10-CM | POA: Diagnosis not present

## 2019-05-11 DIAGNOSIS — J449 Chronic obstructive pulmonary disease, unspecified: Secondary | ICD-10-CM | POA: Diagnosis present

## 2019-05-11 DIAGNOSIS — F331 Major depressive disorder, recurrent, moderate: Secondary | ICD-10-CM

## 2019-05-11 DIAGNOSIS — E871 Hypo-osmolality and hyponatremia: Secondary | ICD-10-CM | POA: Diagnosis present

## 2019-05-11 DIAGNOSIS — E8770 Fluid overload, unspecified: Secondary | ICD-10-CM | POA: Diagnosis present

## 2019-05-11 DIAGNOSIS — M170 Bilateral primary osteoarthritis of knee: Secondary | ICD-10-CM | POA: Diagnosis present

## 2019-05-11 DIAGNOSIS — M069 Rheumatoid arthritis, unspecified: Secondary | ICD-10-CM | POA: Diagnosis present

## 2019-05-11 DIAGNOSIS — Z86711 Personal history of pulmonary embolism: Secondary | ICD-10-CM

## 2019-05-11 DIAGNOSIS — E785 Hyperlipidemia, unspecified: Secondary | ICD-10-CM | POA: Diagnosis present

## 2019-05-11 DIAGNOSIS — F332 Major depressive disorder, recurrent severe without psychotic features: Secondary | ICD-10-CM | POA: Diagnosis present

## 2019-05-11 DIAGNOSIS — N390 Urinary tract infection, site not specified: Secondary | ICD-10-CM | POA: Diagnosis present

## 2019-05-11 DIAGNOSIS — I10 Essential (primary) hypertension: Secondary | ICD-10-CM | POA: Diagnosis present

## 2019-05-11 DIAGNOSIS — R45851 Suicidal ideations: Secondary | ICD-10-CM | POA: Diagnosis present

## 2019-05-11 DIAGNOSIS — Z87891 Personal history of nicotine dependence: Secondary | ICD-10-CM

## 2019-05-11 DIAGNOSIS — J9811 Atelectasis: Secondary | ICD-10-CM | POA: Diagnosis not present

## 2019-05-11 DIAGNOSIS — Z20828 Contact with and (suspected) exposure to other viral communicable diseases: Secondary | ICD-10-CM | POA: Diagnosis present

## 2019-05-11 DIAGNOSIS — R9431 Abnormal electrocardiogram [ECG] [EKG]: Secondary | ICD-10-CM

## 2019-05-11 DIAGNOSIS — I480 Paroxysmal atrial fibrillation: Secondary | ICD-10-CM | POA: Diagnosis present

## 2019-05-11 DIAGNOSIS — E039 Hypothyroidism, unspecified: Secondary | ICD-10-CM | POA: Diagnosis present

## 2019-05-11 DIAGNOSIS — Z6841 Body Mass Index (BMI) 40.0 and over, adult: Secondary | ICD-10-CM

## 2019-05-11 DIAGNOSIS — I878 Other specified disorders of veins: Secondary | ICD-10-CM | POA: Diagnosis present

## 2019-05-11 DIAGNOSIS — Z9114 Patient's other noncompliance with medication regimen: Secondary | ICD-10-CM

## 2019-05-11 DIAGNOSIS — Z915 Personal history of self-harm: Secondary | ICD-10-CM

## 2019-05-11 LAB — BASIC METABOLIC PANEL
Anion gap: 15 (ref 5–15)
BUN: 9 mg/dL (ref 8–23)
CO2: 25 mmol/L (ref 22–32)
Calcium: 9 mg/dL (ref 8.9–10.3)
Chloride: 92 mmol/L — ABNORMAL LOW (ref 98–111)
Creatinine, Ser: 1.14 mg/dL — ABNORMAL HIGH (ref 0.44–1.00)
GFR calc Af Amer: 52 mL/min — ABNORMAL LOW (ref >60)
GFR calc non Af Amer: 45 mL/min — ABNORMAL LOW (ref >60)
Glucose, Bld: 140 mg/dL — ABNORMAL HIGH (ref 70–99)
Potassium: 2.9 mmol/L — ABNORMAL LOW (ref 3.5–5.1)
Sodium: 132 mmol/L — ABNORMAL LOW (ref 135–145)

## 2019-05-11 LAB — CBC
HCT: 43.1 % (ref 36.0–46.0)
Hemoglobin: 14.4 g/dL (ref 12.0–15.0)
MCH: 29.3 pg (ref 26.0–34.0)
MCHC: 33.4 g/dL (ref 30.0–36.0)
MCV: 87.8 fL (ref 80.0–100.0)
Platelets: 254 10*3/uL (ref 150–400)
RBC: 4.91 MIL/uL (ref 3.87–5.11)
RDW: 14.1 % (ref 11.5–15.5)
WBC: 12.5 10*3/uL — ABNORMAL HIGH (ref 4.0–10.5)
nRBC: 0 % (ref 0.0–0.2)

## 2019-05-11 LAB — TSH: TSH: 14.74 u[IU]/mL — ABNORMAL HIGH (ref 0.350–4.500)

## 2019-05-11 LAB — T4, FREE: Free T4: 0.78 ng/dL (ref 0.61–1.12)

## 2019-05-11 MED ORDER — TIOTROPIUM BROMIDE MONOHYDRATE 18 MCG IN CAPS: 18.0000 ug | ORAL_CAPSULE | Freq: Every day | RESPIRATORY_TRACT | Status: DC

## 2019-05-11 MED ORDER — POTASSIUM CHLORIDE CRYS ER 20 MEQ PO TBCR
20.0000 meq | EXTENDED_RELEASE_TABLET | Freq: Every day | ORAL | Status: DC
  Administered 2019-05-11 – 2019-05-12 (×2): 20 meq via ORAL
  Filled 2019-05-11 (×2): qty 1

## 2019-05-11 MED ORDER — IPRATROPIUM-ALBUTEROL 0.5-2.5 (3) MG/3ML IN SOLN
3.0000 mL | Freq: Four times a day (QID) | RESPIRATORY_TRACT | Status: DC | PRN
  Administered 2019-05-12 – 2019-05-22 (×8): 3 mL via RESPIRATORY_TRACT
  Filled 2019-05-11 (×9): qty 3

## 2019-05-11 MED ORDER — DOCUSATE SODIUM 100 MG PO CAPS
100.0000 mg | ORAL_CAPSULE | Freq: Two times a day (BID) | ORAL | Status: DC
  Administered 2019-05-11 – 2019-05-17 (×12): 100 mg via ORAL
  Filled 2019-05-11 (×12): qty 1

## 2019-05-11 MED ORDER — ASPIRIN EC 81 MG PO TBEC
81.0000 mg | DELAYED_RELEASE_TABLET | Freq: Every day | ORAL | Status: DC
  Administered 2019-05-11 – 2019-05-26 (×16): 81 mg via ORAL
  Filled 2019-05-11 (×16): qty 1

## 2019-05-11 MED ORDER — METOPROLOL SUCCINATE ER 50 MG PO TB24
50.0000 mg | ORAL_TABLET | Freq: Two times a day (BID) | ORAL | Status: DC
  Administered 2019-05-11 – 2019-05-26 (×30): 50 mg via ORAL
  Filled 2019-05-11 (×4): qty 1
  Filled 2019-05-11 (×2): qty 2
  Filled 2019-05-11: qty 1
  Filled 2019-05-11: qty 2
  Filled 2019-05-11 (×11): qty 1
  Filled 2019-05-11: qty 2
  Filled 2019-05-11 (×10): qty 1

## 2019-05-11 MED ORDER — APIXABAN 5 MG PO TABS
5.0000 mg | ORAL_TABLET | Freq: Two times a day (BID) | ORAL | Status: DC
  Administered 2019-05-11 – 2019-05-26 (×30): 5 mg via ORAL
  Filled 2019-05-11 (×31): qty 1

## 2019-05-11 MED ORDER — POTASSIUM CHLORIDE CRYS ER 20 MEQ PO TBCR: 20.0000 meq | EXTENDED_RELEASE_TABLET | Freq: Every day | ORAL | Status: DC

## 2019-05-11 MED ORDER — FUROSEMIDE 20 MG PO TABS: 20.0000 mg | ORAL_TABLET | Freq: Every day | ORAL | Status: DC

## 2019-05-11 MED ORDER — GUAIFENESIN ER 600 MG PO TB12
600.0000 mg | ORAL_TABLET | Freq: Two times a day (BID) | ORAL | Status: DC
  Administered 2019-05-11 – 2019-05-26 (×30): 600 mg via ORAL
  Filled 2019-05-11 (×30): qty 1

## 2019-05-11 MED ORDER — IPRATROPIUM-ALBUTEROL 0.5-2.5 (3) MG/3ML IN SOLN
3.0000 mL | Freq: Once | RESPIRATORY_TRACT | Status: AC
  Administered 2019-05-11: 15:00:00 3 mL via RESPIRATORY_TRACT
  Filled 2019-05-11: qty 3

## 2019-05-11 MED ORDER — FUROSEMIDE 20 MG PO TABS
20.0000 mg | ORAL_TABLET | Freq: Every day | ORAL | Status: DC
  Administered 2019-05-11 – 2019-05-12 (×2): 20 mg via ORAL
  Filled 2019-05-11 (×2): qty 1

## 2019-05-11 MED ORDER — LEFLUNOMIDE 20 MG PO TABS
10.0000 mg | ORAL_TABLET | Freq: Every day | ORAL | Status: DC
  Administered 2019-05-11 – 2019-05-26 (×16): 10 mg via ORAL
  Filled 2019-05-11 (×16): qty 0.5

## 2019-05-11 MED ORDER — LEVOTHYROXINE SODIUM 75 MCG PO TABS
150.0000 ug | ORAL_TABLET | Freq: Every day | ORAL | Status: DC
  Administered 2019-05-12 – 2019-05-26 (×15): 150 ug via ORAL
  Filled 2019-05-11 (×16): qty 2

## 2019-05-11 MED ORDER — UMECLIDINIUM BROMIDE 62.5 MCG/INH IN AEPB
1.0000 | INHALATION_SPRAY | Freq: Every day | RESPIRATORY_TRACT | Status: DC
  Administered 2019-05-11: 1 via RESPIRATORY_TRACT
  Filled 2019-05-11 (×2): qty 7

## 2019-05-11 MED ORDER — FUROSEMIDE 20 MG PO TABS
20.0000 mg | ORAL_TABLET | Freq: Once | ORAL | Status: AC
  Administered 2019-05-11: 20 mg via ORAL
  Filled 2019-05-11: qty 1

## 2019-05-11 MED ORDER — METOLAZONE 2.5 MG PO TABS
2.5000 mg | ORAL_TABLET | ORAL | Status: DC
  Administered 2019-05-11: 2.5 mg via ORAL
  Filled 2019-05-11: qty 1

## 2019-05-11 MED ORDER — ONDANSETRON HCL 4 MG PO TABS
4.0000 mg | ORAL_TABLET | Freq: Once | ORAL | Status: AC
  Administered 2019-05-11: 4 mg via ORAL
  Filled 2019-05-11: qty 1

## 2019-05-11 MED ORDER — PRAVASTATIN SODIUM 40 MG PO TABS
40.0000 mg | ORAL_TABLET | Freq: Every day | ORAL | Status: DC
  Administered 2019-05-11 – 2019-05-25 (×15): 40 mg via ORAL
  Filled 2019-05-11 (×15): qty 1

## 2019-05-11 NOTE — ED Notes (Signed)
Pts daughter called about plan of care for the patient.

## 2019-05-11 NOTE — ED Notes (Signed)
Pt placed in purple scrubs.  Pt belongings inventoried 1 valuable bag placed with security 1 belongings bag placed in locker #4.

## 2019-05-11 NOTE — ED Triage Notes (Signed)
[facility]- pt here from home. Pt is having a COPD exacerbation. Pt has not been compliant with medications. This is not the first time per family. Pt states she doesn't take her meds because this is no way of living. Pt is unable to walk up stairs. Family is requesting psych consult and social work.

## 2019-05-11 NOTE — ED Provider Notes (Signed)
[facility] EMERGENCY DEPARTMENT Provider Note   CSN: [id number] Arrival date & time: 05/11/19  1345    History   Chief Complaint No chief complaint on file.   HPI Kristina May is a 83 y.o. female with past medical history of COPD, hypertension, hypothyroidism who presents with a two-week history of refusal to take medications.  Patient reports that she stopped taking medications because she does not see the point in living anymore and wants to die.  She says that she sits in her chair 24 hours a day and often has pain from her rheumatoid arthritis.  She lives with her daughter and son-in-law.  Patient says she has had thoughts of killing herself but does not have an active plan.  She has previously thought about hanging herself but does not like choking and has considered cutting herself but does not know where to cut. She has attempted suicide 1 time previously approximately 1 year ago when she reports that she took a bunch of pills for which she does not remember their names. She states that the pills had no effect on her.   Patient reports some shortness of breath this morning for which she took her advair. She does not think her shortness of breath is much worse than her baseline. She also reports one episode of non-bloody non-bilious emesis yesterday. She reports sleeping at night in a chair.     HPI  Past Medical History:  Diagnosis Date  . COPD (chronic obstructive pulmonary disease) (HCC)   . Dyslipidemia   . Hypertension   . Rheumatoid arthritis(714.0)   . Thyroid disease     Patient Active Problem List   Diagnosis Date Noted  . Right knee pain 10/12/2013  . Arthritis pain, hand 10/12/2013  . Unspecified essential hypertension 10/12/2013  . Unspecified hypothyroidism 10/12/2013  . Dyslipidemia 10/12/2013  . Rheumatoid arthritis(714.0)   . Polyarthritis 10/11/2013    Past Surgical History:  Procedure Laterality Date  . FOOT SURGERY       OB  History   No obstetric history on file.      Home Medications    Prior to Admission medications   Medication Sig Start Date End Date Taking? Authorizing Provider  apixaban (ELIQUIS) 5 MG TABS tablet Take 5 mg by mouth 2 (two) times daily.   Yes [provider]  aspirin EC 81 MG tablet Take 81 mg by mouth daily.   Yes [provider]  Cholecalciferol (VITAMIN D) 2000 UNITS tablet Take 2,000 Units by mouth daily.   Yes [provider]  docusate sodium (COLACE) 100 MG capsule Take 100 mg by mouth 2 (two) times daily.   Yes [provider]  fexofenadine (ALLEGRA) 180 MG tablet Take 180 mg by mouth daily.   Yes [provider]  Fluticasone-Salmeterol (WIXELA INHUB) 250-50 MCG/DOSE AEPB Inhale 1 puff into the lungs 2 (two) times daily.   Yes [provider]  folic acid (FOLVITE) 1 MG tablet Take 1 mg by mouth daily.   Yes [provider]  furosemide (LASIX) 20 MG tablet Take 20 mg by mouth daily.   Yes [provider]  guaiFENesin (MUCINEX) 600 MG 12 hr tablet Take by mouth 2 (two) times daily.   Yes [provider]  ipratropium-albuterol (DUONEB) 0.5-2.5 (3) MG/3ML SOLN Take 3 mLs by nebulization every 6 (six) hours as needed (wheezing).    Yes [provider]  leflunomide (ARAVA) 10 MG tablet Take 10 mg by mouth  daily.    Yes [provider]  levothyroxine (SYNTHROID) 150 MCG tablet Take 150 mcg by mouth daily before breakfast.    Yes [provider]  metolazone (ZAROXOLYN) 2.5 MG tablet Take 2.5 mg by mouth 2 (two) times a week.    Yes [provider]  metoprolol succinate (TOPROL-XL) 50 MG 24 hr tablet Take 50 mg by mouth 2 (two) times daily. Take with or immediately following a meal.   Yes [provider]  naproxen sodium (ALEVE) 220 MG tablet Take 220 mg by mouth daily as needed (pain).   Yes [provider]  potassium chloride SA (K-DUR,KLOR-CON) 20 MEQ  tablet Take 20 mEq by mouth daily.    Yes [provider]  pravastatin (PRAVACHOL) 40 MG tablet Take 40 mg by mouth at bedtime.   Yes [provider]  predniSONE (DELTASONE) 5 MG tablet Take 5 mg by mouth 2 (two) times daily with a meal.   Yes [provider]  Sennosides (SENNA-MAX PO) Take 1 tablet by mouth daily as needed (constipation).    Yes [provider]  tiotropium (SPIRIVA) 18 MCG inhalation capsule Place 18 mcg into inhaler and inhale daily.   Yes [provider]    Family History Family History  Problem Relation Age of Onset  . Multiple sclerosis Other     Social History Social History   Tobacco Use  . Smoking status: Former Smoker  Substance Use Topics  . Alcohol use: Yes    Comment: Occ  . Drug use: No     Allergies   Latex, Betamethasone, Dexamethasone, Tape, Tylenol [acetaminophen], Remicade [infliximab], and Claritin [loratadine]   Review of Systems Review of Systems  Respiratory: Positive for shortness of breath.   Cardiovascular: Negative for chest pain.  Gastrointestinal: Positive for nausea and vomiting. Negative for diarrhea.  Genitourinary: Negative for dysuria.  Psychiatric/Behavioral: Positive for suicidal ideas.  All other systems reviewed and are negative. * Suicidal ideas as explained in the HPI   Physical Exam Updated Vital Signs BP 131/62   Pulse 86   Temp 98.6 F (37 C) (Oral)   Resp (!) 22   Ht 5\' 5"  (1.651 m)   Wt 113.4 kg   SpO2 96%   BMI 41.60 kg/m   Physical Exam Constitutional:      Appearance: Normal appearance.  HENT:     Right Ear: External ear normal.     Left Ear: External ear normal.     Mouth/Throat:     Mouth: Mucous membranes are moist.  Cardiovascular:     Rate and Rhythm: Normal rate and regular rhythm.     Heart sounds: No murmur. No friction rub. No gallop.   Pulmonary:     Effort: Pulmonary effort is normal.     Breath sounds: Wheezing present. No rhonchi  or rales.     Comments: Mild diffuse bilateral expiratory wheezes Abdominal:     General: Abdomen is flat. Bowel sounds are normal. There is no distension.     Palpations: Abdomen is soft.     Tenderness: There is no abdominal tenderness. There is no guarding.  Musculoskeletal:        General: Swelling present.     Comments: 2+ pitting edema in bilateral ankles  Skin:    General: Skin is warm and dry.  Neurological:     Mental Status: She is alert.      ED Treatments / Results  Labs (all labs ordered are listed, but  only abnormal results are displayed) Labs Reviewed  CBC - Abnormal; Notable for the following components:      Result Value   WBC 12.5 (*)    All other components within normal limits  BASIC METABOLIC PANEL - Abnormal; Notable for the following components:   Sodium 132 (*)    Potassium 2.9 (*)    Chloride 92 (*)    Glucose, Bld 140 (*)    Creatinine, Ser 1.14 (*)    GFR calc non Af Amer 45 (*)    GFR calc Af Amer 52 (*)    All other components within normal limits  TSH - Abnormal; Notable for the following components:   TSH 14.740 (*)    All other components within normal limits  T4, FREE    EKG EKG Interpretation  Date/Time:  Monday May 11 2019 13:55:33 EDT Ventricular Rate:  98 PR Interval:    QRS Duration: 87 QT Interval:  392 QTC Calculation: 501 R Axis:   -58 Text Interpretation:  Sinus rhythm Ventricular premature complex Left anterior fascicular block Abnormal R-wave progression, early transition Probable LVH with secondary repol abnrm Baseline wander Artifact Abnormal ECG Confirmed by Carmin Muskrat [phone]) on 05/11/2019 3:07:54 PM   Radiology Dg Chest 2 View  Result Date: 05/11/2019 CLINICAL DATA:  Pt states she has not taken her BP, cholesterol meds in 2 weeks. pt here from home. Pt is having a COPD exacerbation. Pt has not been compliant with medications. This is not the first time per family. Pt states she doesn't take her meds  because this is no way of living. Pt is unable to walk up stairs. Family is requesting psych consult and social work. No chest pain, cough , nor fever per pt, Hx of HTN. EXAM: CHEST - 2 VIEW COMPARISON:  None. FINDINGS: Cardiac silhouette is normal in size. No mediastinal or hilar masses. No evidence of adenopathy. Lungs are hyperexpanded. There are prominent bronchovascular markings with mild interstitial thickening most evident in bases. No evidence of pneumonia or pulmonary edema. No pleural effusion or pneumothorax. Skeletal structures are demineralized but grossly intact. IMPRESSION: 1. No acute cardiopulmonary disease. Electronically Signed   By: Lajean Manes M.D.   On: 05/11/2019 17:11    Procedures Procedures (including critical care time)  Medications Ordered in ED Medications  apixaban (ELIQUIS) tablet 5 mg (has no administration in time range)  aspirin EC tablet 81 mg (has no administration in time range)  docusate sodium (COLACE) capsule 100 mg (has no administration in time range)  furosemide (LASIX) tablet 20 mg (has no administration in time range)  ipratropium-albuterol (DUONEB) 0.5-2.5 (3) MG/3ML nebulizer solution 3 mL (has no administration in time range)  guaiFENesin (MUCINEX) 12 hr tablet 600 mg (has no administration in time range)  leflunomide (ARAVA) tablet 10 mg (has no administration in time range)  levothyroxine (SYNTHROID) tablet 150 mcg (has no administration in time range)  metolazone (ZAROXOLYN) tablet 2.5 mg (has no administration in time range)  pravastatin (PRAVACHOL) tablet 40 mg (has no administration in time range)  potassium chloride SA (K-DUR) CR tablet 20 mEq (has no administration in time range)  metoprolol succinate (TOPROL-XL) 24 hr tablet 50 mg (has no administration in time range)  umeclidinium bromide (INCRUSE ELLIPTA) 62.5 MCG/INH 1 puff (has no administration in time range)  ipratropium-albuterol (DUONEB) 0.5-2.5 (3) MG/3ML nebulizer solution 3 mL  (3 mLs Nebulization Given 05/11/19 1513)  furosemide (LASIX) tablet 20 mg (20 mg Oral Given 05/11/19 1512)  ondansetron (  ZOFRAN) tablet 4 mg (4 mg Oral Given 05/11/19 1512)     Initial Impression / Assessment and Plan / ED Course  I have reviewed the triage vital signs and the nursing notes.  Pertinent labs & imaging results that were available during my care of the patient were reviewed by me and considered in my medical decision making (see chart for details).        Patient presents upon request of family after refusing to take medication over the past 2 weeks. Patient has suicidal ideation but no plan and has insight into the potential ramifications of not taking her medication. TSH was elevated with normal T4, consistent with history of medication noncompliance. Patient has mild expiratory wheezes, improved following breathing treatment. CXR shows no acute cardiopulmonary process and WBC stable from baseline.   Psychiatry evaluated patient and recommends inpatient geriatric psych facility. Patient is very happy with this plan and states she was hoping to go to a facility. She has also been compliant with receiving medications while she is in the hospital. It is unclear if patient desires to die or if she was discontinuing medications as a way of acting out and expressing a desire to not live at home with her family. Patient is currently awaiting facility placement.  Final Clinical Impressions(s) / ED Diagnoses   Final diagnoses:  None    ED Discharge Orders    None       Katherine Roan, MD 05/11/19 Serena Croissant    Gerhard Munch, MD 05/11/19 2010

## 2019-05-11 NOTE — ED Notes (Signed)
Patient transported to X-ray 

## 2019-05-11 NOTE — Progress Notes (Signed)
Pt meets inpatient criteria per Earleen Newport, NP. Referral information has been sent to the following hospitals for review:   [facility]  [facility]      Disposition will continue to assist with inpatient placement needs.   Audree Camel, LCSW, [place] Disposition [place] [facility] [phone]
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
## 2019-05-11 NOTE — ED Notes (Signed)
Messaged pharmacy to verify patient's medications.

## 2019-05-12 DIAGNOSIS — E876 Hypokalemia: Principal | ICD-10-CM

## 2019-05-12 LAB — BASIC METABOLIC PANEL
Anion gap: 13 (ref 5–15)
BUN: 9 mg/dL (ref 8–23)
CO2: 26 mmol/L (ref 22–32)
Calcium: 8.3 mg/dL — ABNORMAL LOW (ref 8.9–10.3)
Chloride: 89 mmol/L — ABNORMAL LOW (ref 98–111)
Creatinine, Ser: 1.1 mg/dL — ABNORMAL HIGH (ref 0.44–1.00)
GFR calc Af Amer: 55 mL/min — ABNORMAL LOW (ref >60)
GFR calc non Af Amer: 47 mL/min — ABNORMAL LOW (ref >60)
Glucose, Bld: 130 mg/dL — ABNORMAL HIGH (ref 70–99)
Potassium: 2.7 mmol/L — CL (ref 3.5–5.1)
Sodium: 128 mmol/L — ABNORMAL LOW (ref 135–145)

## 2019-05-12 LAB — SARS CORONAVIRUS 2 BY RT PCR (HOSPITAL ORDER, PERFORMED IN ~~LOC~~ HOSPITAL LAB): SARS Coronavirus 2: NEGATIVE

## 2019-05-12 LAB — URINALYSIS, ROUTINE W REFLEX MICROSCOPIC
Bilirubin Urine: NEGATIVE
Glucose, UA: NEGATIVE mg/dL
Hgb urine dipstick: NEGATIVE
Ketones, ur: NEGATIVE mg/dL
Leukocytes,Ua: NEGATIVE
Nitrite: POSITIVE — AB
Protein, ur: NEGATIVE mg/dL
Specific Gravity, Urine: 1.015 (ref 1.005–1.030)
pH: 5 (ref 5.0–8.0)

## 2019-05-12 LAB — RAPID URINE DRUG SCREEN, HOSP PERFORMED
Amphetamines: NOT DETECTED
Barbiturates: NOT DETECTED
Benzodiazepines: NOT DETECTED
Cocaine: NOT DETECTED
Opiates: NOT DETECTED
Tetrahydrocannabinol: NOT DETECTED

## 2019-05-12 LAB — MAGNESIUM: Magnesium: 1.7 mg/dL (ref 1.7–2.4)

## 2019-05-12 LAB — POTASSIUM
Potassium: 2.5 mmol/L — CL (ref 3.5–5.1)
Potassium: 2.9 mmol/L — ABNORMAL LOW (ref 3.5–5.1)
Potassium: 2.7 mmol/L — CL (ref 3.5–5.1)

## 2019-05-12 MED ORDER — POTASSIUM CHLORIDE 10 MEQ/100ML IV SOLN
10.0000 meq | Freq: Once | INTRAVENOUS | Status: AC
  Administered 2019-05-12: 10 meq via INTRAVENOUS
  Filled 2019-05-12: qty 100

## 2019-05-12 MED ORDER — POTASSIUM CHLORIDE CRYS ER 20 MEQ PO TBCR
20.0000 meq | EXTENDED_RELEASE_TABLET | Freq: Two times a day (BID) | ORAL | Status: DC
  Administered 2019-05-12 (×2): 20 meq via ORAL
  Filled 2019-05-12 (×2): qty 1

## 2019-05-12 MED ORDER — POTASSIUM CHLORIDE 10 MEQ/100ML IV SOLN: 10.0000 meq | Freq: Once | INTRAVENOUS | Status: DC

## 2019-05-12 MED ORDER — SODIUM CHLORIDE 0.9 % IV SOLN
Freq: Once | INTRAVENOUS | Status: AC
  Administered 2019-05-12: 11:00:00 via INTRAVENOUS

## 2019-05-12 MED ORDER — TIOTROPIUM BROMIDE MONOHYDRATE 18 MCG IN CAPS: 18.0000 ug | ORAL_CAPSULE | Freq: Every day | RESPIRATORY_TRACT | Status: DC

## 2019-05-12 NOTE — ED Provider Notes (Signed)
83 year old female here ideation.  Please see previous note for full history/physical exam.  RN called me because she was concerned about swelling to bilateral lower extremities.  I reviewed patient's note which noted 2+ pitting edema noted bilateral lower extremities.  Discussed with patient.  She reports that she always has swelling of her bilateral lower extremities and ankles.  On evaluation, she had 2+ pitting edema noted bilateral lower extremities.  She did have some diffuse edema noted to the ankles and dorsal aspect of the foot.  No overlying warmth, erythema that would be concerning for infectious process.  Patient with 2+ DP pulses bilaterally.  Patient is eating dinner and has no signs of distress.  This time, do not feel that she needs any further intervention.  Repeat potassium 2.7.  Unclear if this was done before or after potassium.  We will plan to repeat BMP.  Repeat BMP shows potassium still at 2.7.  At this point, she has had 3 rounds of IV potassium as well as multiple oral potassium.  Given hypokalemia, she cannot be medically cleared.  Facility will not take patient until she is medically cleared and hypokalemia is corrected.  Plan for medical admission. Discussed patient with Dr. Laverta Baltimore who is agreeable to plan.   Discussed patient with Dr. Marlowe Sax (hospitalist) who will admit.   Portions of this note were generated with Lobbyist. Dictation errors may occur despite best attempts at proofreading.     Volanda Napoleon, PA-C 05/14/19 0111    Margette Fast, MD 05/14/19 1239

## 2019-05-12 NOTE — H&P (Addendum)
History and Physical    Kristina May AJG:[id number] DOB: 23-Jan-1936 DOA: 05/11/2019  PCP: Jettie Pagan, NP Patient coming from: Home  Chief Complaint: Suicidal ideation  HPI: Kristina May is a 83 y.o. female with medical history significant of COPD, hypertension, hyperlipidemia, rheumatoid arthritis, hypothyroidism, paroxysmal atrial fibrillation and PE on Eliquis presented to the ED via EMS per family request on May 11, 2019 with a 2-week history of refusal to take medications. Patient told ED provider that she stopped taking medications because she did not see a point in living anymore and wanted to die.  Patient states she was sent to the hospital by her family because she has not been taking her medications.  States she stopped taking all of her medications 2 weeks ago because she wanted to die.  Patient states she is tired of living with chronic pain.  States she has pain from neuropathy in her feet, pain from arthritis in both of her knees and hips.  States she is not depressed and has never been diagnosed with depression.  Denies fevers, chest pain, or shortness of breath.  States she vomited a little once a few days ago after eating a cracker but no episodes of vomiting since then.  No abdominal pain or diarrhea.  No other complaints.  ED Course: Psychiatry evaluated the patient and recommended inpatient geriatric psych facility. Patient remained in the ED since then awaiting a bed in the psych facility.  Potassium was found to be significantly low on labs and EKG with QT prolongation.  Patient was given IV potassium 10 mEq x 4 and oral potassium 20 mEq x2 since yesterday.  Most recent potassium 2.7.  Given hypokalemia, patient could not be medically cleared.    Labs done yesterday showing white count 12.5, potassium 2.9, BUN 9, creatinine 1.1, TSH 14.7, and normal free T4.  Chest x-ray showing no acute cardiopulmonary disease. Vital signs stable today.  COVID-19 test  negative.  Labs done today showing sodium 128, BUN 9, creatinine 1.1.  Magnesium 1.7.  Potassium 2.5 initially, repeat 2.7 today.  Review of Systems:  All systems reviewed and apart from history of presenting illness, are negative.  Past Medical History:  Diagnosis Date   COPD (chronic obstructive pulmonary disease) ([facility])    Dyslipidemia    Hypertension    Rheumatoid arthritis(714.0)    Thyroid disease     Past Surgical History:  Procedure Laterality Date   FOOT SURGERY       reports that she has quit smoking. She does not have any smokeless tobacco history on file. She reports current alcohol use. She reports that she does not use drugs.  Allergies  Allergen Reactions   Latex Other (See Comments)    Causes blisters   Betamethasone Hives   Dexamethasone Hives   Tape Other (See Comments)    Caused blisters    Tylenol [Acetaminophen] Other (See Comments)    Doesn't work for patient    Remicade [Infliximab] Other (See Comments)    She went to rehab because of it   Claritin [Loratadine] Other (See Comments)    Doesn't work for patient    Family History  Problem Relation Age of Onset   Multiple sclerosis Other     Prior to Admission medications   Medication Sig Start Date End Date Taking? Authorizing Provider  apixaban (ELIQUIS) 5 MG TABS tablet Take 5 mg by mouth 2 (two) times daily.   Yes [provider]  aspirin EC 81 MG  tablet Take 81 mg by mouth daily.   Yes [provider]  Cholecalciferol (VITAMIN D) 2000 UNITS tablet Take 2,000 Units by mouth daily.   Yes [provider]  docusate sodium (COLACE) 100 MG capsule Take 100 mg by mouth 2 (two) times daily.   Yes [provider]  fexofenadine (ALLEGRA) 180 MG tablet Take 180 mg by mouth daily.   Yes [provider]  Fluticasone-Salmeterol (WIXELA INHUB) 250-50 MCG/DOSE AEPB Inhale 1 puff into the lungs 2 (two) times daily.   Yes [provider]  folic  acid (FOLVITE) 1 MG tablet Take 1 mg by mouth daily.   Yes [provider]  furosemide (LASIX) 20 MG tablet Take 20 mg by mouth daily.   Yes [provider]  guaiFENesin (MUCINEX) 600 MG 12 hr tablet Take by mouth 2 (two) times daily.   Yes [provider]  ipratropium-albuterol (DUONEB) 0.5-2.5 (3) MG/3ML SOLN Take 3 mLs by nebulization every 6 (six) hours as needed (wheezing).    Yes [provider]  leflunomide (ARAVA) 10 MG tablet Take 10 mg by mouth daily.    Yes [provider]  levothyroxine (SYNTHROID) 150 MCG tablet Take 150 mcg by mouth daily before breakfast.    Yes [provider]  metolazone (ZAROXOLYN) 2.5 MG tablet Take 2.5 mg by mouth 2 (two) times a week.    Yes [provider]  metoprolol succinate (TOPROL-XL) 50 MG 24 hr tablet Take 50 mg by mouth 2 (two) times daily. Take with or immediately following a meal.   Yes [provider]  naproxen sodium (ALEVE) 220 MG tablet Take 220 mg by mouth daily as needed (pain).   Yes [provider]  potassium chloride SA (K-DUR,KLOR-CON) 20 MEQ tablet Take 20 mEq by mouth daily.    Yes [provider]  pravastatin (PRAVACHOL) 40 MG tablet Take 40 mg by mouth at bedtime.   Yes [provider]  predniSONE (DELTASONE) 5 MG tablet Take 5 mg by mouth 2 (two) times daily with a meal.   Yes [provider]  Sennosides (SENNA-MAX PO) Take 1 tablet by mouth daily as needed (constipation).    Yes [provider]  tiotropium (SPIRIVA) 18 MCG inhalation capsule Place 18 mcg into inhaler and inhale daily.   Yes [provider]    Physical Exam: Vitals:   05/12/19 2330 05/13/19 0200 05/13/19 0412 05/13/19 0445  BP: (P) 125/69 122/61    Pulse: (P) 77 76 86 80  Resp: (P) 16 20 (!) 21 (!) 24  Temp: (P) 98.6 F (37 C)     TempSrc:      SpO2: (P) 100% 98% 98% 98%  Weight:      Height:        Physical Exam  Constitutional:  She is oriented to person, place, and time. She appears well-developed and well-nourished. No distress.  HENT:  Head: Normocephalic.  Mouth/Throat: Oropharynx is clear and moist.  Eyes: Right eye exhibits no discharge. Left eye exhibits no discharge.  Neck: Neck supple.  Cardiovascular: Normal rate, regular rhythm and intact distal pulses.  Pulmonary/Chest: Effort normal and breath sounds normal. No respiratory distress. She has no wheezes.  Bibasilar rales  Abdominal: Soft. Bowel sounds are normal. She exhibits no distension. There is no abdominal tenderness. There is no guarding.  Musculoskeletal:        General: Edema present.     Comments: Significant pitting edema of bilateral lower extremities  Neurological:  She is alert and oriented to person, place, and time.  Skin: Skin is warm and dry. She is not diaphoretic.     Labs on Admission: I have personally reviewed following labs and imaging studies  CBC: Recent Labs  Lab 05/11/19 1358  WBC 12.5*  HGB 14.4  HCT 43.1  MCV 87.8  PLT 254   Basic Metabolic Panel: Recent Labs  Lab 05/11/19 1358 05/12/19 0923 05/12/19 1406 05/12/19 1808 05/12/19 2138  NA 132*  --   --   --  128*  K 2.9* 2.5* 2.9* 2.7* 2.7*  CL 92*  --   --   --  89*  CO2 25  --   --   --  26  GLUCOSE 140*  --   --   --  130*  BUN 9  --   --   --  9  CREATININE 1.14*  --   --   --  1.10*  CALCIUM 9.0  --   --   --  8.3*  MG  --  1.7  --   --   --    GFR: Estimated Creatinine Clearance: 50.4 mL/min (A) (by C-G formula based on SCr of 1.1 mg/dL (H)). Liver Function Tests: No results for input(s): AST, ALT, ALKPHOS, BILITOT, PROT, ALBUMIN in the last 168 hours. No results for input(s): LIPASE, AMYLASE in the last 168 hours. No results for input(s): AMMONIA in the last 168 hours. Coagulation Profile: No results for input(s): INR, PROTIME in the last 168 hours. Cardiac Enzymes: No results for input(s): CKTOTAL, CKMB, CKMBINDEX, TROPONINI in the last  168 hours. BNP (last 3 results) No results for input(s): PROBNP in the last 8760 hours. HbA1C: No results for input(s): HGBA1C in the last 72 hours. CBG: No results for input(s): GLUCAP in the last 168 hours. Lipid Profile: No results for input(s): CHOL, HDL, LDLCALC, TRIG, CHOLHDL, LDLDIRECT in the last 72 hours. Thyroid Function Tests: Recent Labs    05/11/19 1358 05/11/19 1516  TSH  --  14.740*  FREET4 0.78  --    Anemia Panel: No results for input(s): VITAMINB12, FOLATE, FERRITIN, TIBC, IRON, RETICCTPCT in the last 72 hours. Urine analysis:    Component Value Date/Time   COLORURINE YELLOW 05/12/2019 0852   APPEARANCEUR HAZY (A) 05/12/2019 0852   LABSPEC 1.015 05/12/2019 0852   PHURINE 5.0 05/12/2019 0852   GLUCOSEU NEGATIVE 05/12/2019 0852   HGBUR NEGATIVE 05/12/2019 0852   BILIRUBINUR NEGATIVE 05/12/2019 0852   KETONESUR NEGATIVE 05/12/2019 0852   PROTEINUR NEGATIVE 05/12/2019 0852   NITRITE POSITIVE (A) 05/12/2019 0852   LEUKOCYTESUR NEGATIVE 05/12/2019 0852    Radiological Exams on Admission: Dg Chest 2 View  Result Date: 05/11/2019 CLINICAL DATA:  Pt states she has not taken her BP, cholesterol meds in 2 weeks. pt here from home. Pt is having a COPD exacerbation. Pt has not been compliant with medications. This is not the first time per family. Pt states she doesn't take her meds because this is no way of living. Pt is unable to walk up stairs. Family is requesting psych consult and social work. No chest pain, cough , nor fever per pt, Hx of HTN. EXAM: CHEST - 2 VIEW COMPARISON:  None. FINDINGS: Cardiac silhouette is normal in size. No mediastinal or hilar masses. No evidence of adenopathy. Lungs are hyperexpanded. There are prominent bronchovascular markings with mild interstitial thickening most evident in bases. No evidence of pneumonia or pulmonary edema. No pleural effusion or pneumothorax.  Skeletal structures are demineralized but grossly intact. IMPRESSION: 1. No  acute cardiopulmonary disease. Electronically Signed   By: Amie Portlandavid  Ormond M.D.   On: 05/11/2019 17:11    EKG: Independently reviewed.  Sinus rhythm, LAFB, QTc 513.  No prior EKG for comparison.  Assessment/Plan Principal Problem:   Hypokalemia Active Problems:   QT prolongation   UTI (urinary tract infection)   Suicidal ideation   Venous stasis   Persistent hypokalemia, QT prolongation on EKG Patient has not taken her home diuretics for the past 2 weeks.  Potassium continues to be low in the ED despite supplementation.  Magnesium level 1.7.  EKG with QT prolongation. -Replete magnesium and potassium.  Keep potassium above 4 and magnesium above 2. -Continue to monitor potassium and magnesium levels -Cardiac monitoring -Repeat EKG in a.m.  UTI UA with negative leukocyte esterase and 0-5 WBCs, however, nitrite positive.  Does have mild leukocytosis. -Ceftriaxone -Urine culture  Suicidal ideation Psychiatry evaluated the patient and recommended inpatient geriatric psych facility.  Pending medical clearance. -Suicide precautions -Sitter at bedside  Chronic venous stasis  Patient has significant bilateral lower extremity edema pitting edema in the setting of home diuretic noncompliance (supposed to take Lasix and metolazone). No documented history of CHF.  Chest x-ray without evidence of volume overload. -Unable to diurese at this time given persistent hypokalemia.  After hypokalemia resolved, patient will need diuretics.  Hyponatremia Sodium 128.  Has signs of volume overload on exam. -Check serum osmolarity  Hypothyroidism Patient has not taken thyroid replacement therapy for the past 2 weeks.  TSH 14.7, free T4 normal. -Resume home Synthroid -Repeat thyroid function studies in 6 weeks  COPD -Stable.  Continue home inhalers  Paroxysmal atrial fibrillation -Currently in sinus rhythm.  Continue home Eliquis and metoprolol.  History of PE -Continue home  Eliquis  Hypertension  Hyperlipidemia -Continue home pravastatin  Rheumatoid arthritis -Continue home leflunomide and chronic prednisone  Osteoarthritis -Followed by pain management (note under care everywhere).  Continue MS Contin 15 mg every morning and 30 mg nightly.  DVT prophylaxis: Eliquis Code Status: Full code Family Communication: No family available at this time. Disposition Plan: Anticipate discharge to inpatient psych after medical clearance. Consults called: Psychiatry Admission status: It is my clinical opinion that referral for OBSERVATION is reasonable and necessary in this patient based on the above information provided. The aforementioned taken together are felt to place the patient at high risk for further clinical deterioration. However it is anticipated that the patient may be medically stable for discharge from the hospital within 24 to 48 hours.  The medical decision making on this patient was of high complexity and the patient is at high risk for clinical deterioration, therefore this is a level 3 visit.  John GiovanniVasundhra Kharee Lesesne MD [facility] Pager [phone]- [phone]  If 7PM-7AM, please contact night-coverage [email or web address] Password [email or web address]  05/13/2019, 4:54 AM

## 2019-05-12 NOTE — ED Notes (Signed)
[facility] and advised of delay w/transport d/t K+ remains low.

## 2019-05-12 NOTE — ED Notes (Addendum)
[place] PA in w/pt. No new orders received.

## 2019-05-12 NOTE — ED Notes (Signed)
J Hedges PA aware of pt's u/a results - pt is asymptomatic.

## 2019-05-12 NOTE — ED Notes (Signed)
Breakfast ordered 

## 2019-05-12 NOTE — ED Notes (Signed)
Pt aware of need for urine specimen. Walker placed in room d/t pt states she walks w/walker at home.

## 2019-05-12 NOTE — ED Notes (Signed)
Talking w/Sitter.

## 2019-05-12 NOTE — ED Notes (Addendum)
Pt's Potassium 2.9 - order received to administer 3rd run of Potassium 33meq IVPB at 1600 then re-draw Potassium around 1730. Readback for verification. EKG being performed.

## 2019-05-12 NOTE — ED Notes (Signed)
Judson Roch [phone] pts daughter, asking for an update on pt

## 2019-05-12 NOTE — [facility] Counselor (Signed)
  REASSESSMENT  Patient reevaluated for safety and stability. Pt observed sitting up in the bed, alert, cooperative, and oriented x3.  Pt continues to appear depressed and endorses suicidal ideations.  Pt states "My condition is so lousy. 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 I just figured the hell with that!  I been feeling like this for a while.  Last night I threatened to sign myself out and throw myself under a bus."  Pt denies HI/A/V-hallucinations.  Pt continues to meet inpatient criteria.  TTS will continue to look for gero-psych inpatient placement.  Aeris Hersman L. [place], [place], [facility], [facility] Therapeutic Triage Specialist  [phone]

## 2019-05-12 NOTE — ED Notes (Signed)
Pt called out on [place] and stated "I'm going to sign myself out and go jump infront of a bus and kill myself." . I spoke to Pt in room to let her know we will be moving her to a hospital bed in another section where she will be more comfortable. Pt thanked me and said she is really tired and unable to rest in her current room.

## 2019-05-12 NOTE — ED Notes (Signed)
Pt received lunch tray 

## 2019-05-12 NOTE — ED Notes (Signed)
Diet was ordered for Lunch. 

## 2019-05-12 NOTE — ED Notes (Addendum)
Pt voiced understanding and agreement w/tx plan - accepted to [facility]. Offered for pt to contact family member/friend to notify - advised she will call when she arrives to [facility]. Last run of IV Potassium completed - will recheck K+ at 1400 and perform EKG, per J Hedges PA.

## 2019-05-12 NOTE — ED Notes (Signed)
Patient was given a Cup of Ginger Ale. 

## 2019-05-12 NOTE — ED Notes (Signed)
Message sent to [facility] d/t pt c/o lower extremities swelling increasing along w/hands swelling and advised pt is noted to be short of breath when she ambulates to bathroom.

## 2019-05-12 NOTE — ED Provider Notes (Signed)
83 year old female here with suicidal ideation.  Please see previous provider for full H&P.  I was called over to psych as her potassium was significantly low.  Her potassium yesterday was 2.9, again today it was 2.5.  Her EKG does show some QT prolongation.  Patient will receive IV potassium 10 mEq x 2, also given potassium tablet 20 mEq.  Patient had repeat potassium here is noted to be 2.9.  She will again receive IV potassium.  Nursing staff is aware that patient will need ongoing potassium with rechecks before she is medically cleared for disposition.   Okey Regal, PA-C 05/12/19 1600    Sherwood Gambler, MD 05/13/19 [phone]

## 2019-05-12 NOTE — ED Notes (Signed)
L Layden, PA, aware Potassium 2.7.

## 2019-05-12 NOTE — ED Notes (Signed)
Bedside commode placed at bedside d/t pt becoming short of breath w/exertion.

## 2019-05-12 NOTE — Progress Notes (Addendum)
12:12- accepted to [facility] main campus by Dr Selinda Flavin, report [phone]. Can arrive any time per [facility] in admissions.   Followed up on inpatient referrals. Many were not received from last night per intake reps, re-referred to:  [facility] Details       [facility] Details      [facility] Details      [facility] Details      [facility] Details      [facility] Details      [facility] Details      [facility] Office Details      [place], MSW, LCSW Transitions of Care 05/12/2019 [phone]

## 2019-05-12 NOTE — ED Notes (Addendum)
J Hedges PA aware pt's Potassium 2.5 and pt reported she had not been taking her Potassium but had been continuing to take her Lasix. Pt's lower extremities noted to be swollen she reports is not new. Pt's bedroom shoes are at bedside as pt is unable to wear hospital socks d/t uncomfortable.

## 2019-05-12 NOTE — ED Notes (Signed)
Pt ambulated to bathroom and back to room w/o difficulty.  

## 2019-05-13 ENCOUNTER — Encounter (HOSPITAL_COMMUNITY): Payer: Self-pay

## 2019-05-13 DIAGNOSIS — E876 Hypokalemia: Secondary | ICD-10-CM | POA: Diagnosis present

## 2019-05-13 DIAGNOSIS — N390 Urinary tract infection, site not specified: Secondary | ICD-10-CM | POA: Diagnosis present

## 2019-05-13 DIAGNOSIS — Z9114 Patient's other noncompliance with medication regimen: Secondary | ICD-10-CM | POA: Diagnosis not present

## 2019-05-13 DIAGNOSIS — R9431 Abnormal electrocardiogram [ECG] [EKG]: Secondary | ICD-10-CM | POA: Diagnosis not present

## 2019-05-13 DIAGNOSIS — E039 Hypothyroidism, unspecified: Secondary | ICD-10-CM | POA: Diagnosis present

## 2019-05-13 DIAGNOSIS — M069 Rheumatoid arthritis, unspecified: Secondary | ICD-10-CM | POA: Diagnosis present

## 2019-05-13 DIAGNOSIS — F332 Major depressive disorder, recurrent severe without psychotic features: Secondary | ICD-10-CM

## 2019-05-13 DIAGNOSIS — F331 Major depressive disorder, recurrent, moderate: Secondary | ICD-10-CM | POA: Diagnosis not present

## 2019-05-13 DIAGNOSIS — R062 Wheezing: Secondary | ICD-10-CM | POA: Diagnosis present

## 2019-05-13 DIAGNOSIS — Z915 Personal history of self-harm: Secondary | ICD-10-CM | POA: Diagnosis not present

## 2019-05-13 DIAGNOSIS — Z87891 Personal history of nicotine dependence: Secondary | ICD-10-CM | POA: Diagnosis not present

## 2019-05-13 DIAGNOSIS — Z86711 Personal history of pulmonary embolism: Secondary | ICD-10-CM | POA: Diagnosis not present

## 2019-05-13 DIAGNOSIS — I878 Other specified disorders of veins: Secondary | ICD-10-CM | POA: Diagnosis present

## 2019-05-13 DIAGNOSIS — E871 Hypo-osmolality and hyponatremia: Secondary | ICD-10-CM | POA: Diagnosis present

## 2019-05-13 DIAGNOSIS — Z6841 Body Mass Index (BMI) 40.0 and over, adult: Secondary | ICD-10-CM | POA: Diagnosis not present

## 2019-05-13 DIAGNOSIS — R45851 Suicidal ideations: Secondary | ICD-10-CM

## 2019-05-13 DIAGNOSIS — I480 Paroxysmal atrial fibrillation: Secondary | ICD-10-CM | POA: Diagnosis present

## 2019-05-13 DIAGNOSIS — I10 Essential (primary) hypertension: Secondary | ICD-10-CM | POA: Diagnosis present

## 2019-05-13 DIAGNOSIS — Z751 Person awaiting admission to adequate facility elsewhere: Secondary | ICD-10-CM | POA: Diagnosis not present

## 2019-05-13 DIAGNOSIS — J449 Chronic obstructive pulmonary disease, unspecified: Secondary | ICD-10-CM | POA: Diagnosis present

## 2019-05-13 DIAGNOSIS — J9811 Atelectasis: Secondary | ICD-10-CM | POA: Diagnosis not present

## 2019-05-13 DIAGNOSIS — M170 Bilateral primary osteoarthritis of knee: Secondary | ICD-10-CM | POA: Diagnosis present

## 2019-05-13 DIAGNOSIS — E785 Hyperlipidemia, unspecified: Secondary | ICD-10-CM | POA: Diagnosis present

## 2019-05-13 DIAGNOSIS — E8770 Fluid overload, unspecified: Secondary | ICD-10-CM | POA: Diagnosis present

## 2019-05-13 DIAGNOSIS — Z20828 Contact with and (suspected) exposure to other viral communicable diseases: Secondary | ICD-10-CM | POA: Diagnosis present

## 2019-05-13 LAB — BASIC METABOLIC PANEL
Anion gap: 14 (ref 5–15)
Anion gap: 13 (ref 5–15)
BUN: 9 mg/dL (ref 8–23)
BUN: 9 mg/dL (ref 8–23)
CO2: 23 mmol/L (ref 22–32)
CO2: 24 mmol/L (ref 22–32)
Calcium: 8.1 mg/dL — ABNORMAL LOW (ref 8.9–10.3)
Calcium: 8.2 mg/dL — ABNORMAL LOW (ref 8.9–10.3)
Chloride: 90 mmol/L — ABNORMAL LOW (ref 98–111)
Chloride: 90 mmol/L — ABNORMAL LOW (ref 98–111)
Creatinine, Ser: 1.07 mg/dL — ABNORMAL HIGH (ref 0.44–1.00)
Creatinine, Ser: 1.15 mg/dL — ABNORMAL HIGH (ref 0.44–1.00)
GFR calc Af Amer: 56 mL/min — ABNORMAL LOW (ref >60)
GFR calc Af Amer: 52 mL/min — ABNORMAL LOW (ref >60)
GFR calc non Af Amer: 49 mL/min — ABNORMAL LOW (ref >60)
GFR calc non Af Amer: 45 mL/min — ABNORMAL LOW (ref >60)
Glucose, Bld: 143 mg/dL — ABNORMAL HIGH (ref 70–99)
Glucose, Bld: 182 mg/dL — ABNORMAL HIGH (ref 70–99)
Potassium: 3 mmol/L — ABNORMAL LOW (ref 3.5–5.1)
Potassium: 4 mmol/L (ref 3.5–5.1)
Sodium: 127 mmol/L — ABNORMAL LOW (ref 135–145)
Sodium: 127 mmol/L — ABNORMAL LOW (ref 135–145)

## 2019-05-13 LAB — CBC
HCT: 38.4 % (ref 36.0–46.0)
Hemoglobin: 12.8 g/dL (ref 12.0–15.0)
MCH: 29.6 pg (ref 26.0–34.0)
MCHC: 33.3 g/dL (ref 30.0–36.0)
MCV: 88.9 fL (ref 80.0–100.0)
Platelets: 249 10*3/uL (ref 150–400)
RBC: 4.32 MIL/uL (ref 3.87–5.11)
RDW: 14.1 % (ref 11.5–15.5)
WBC: 10.8 10*3/uL — ABNORMAL HIGH (ref 4.0–10.5)
nRBC: 0 % (ref 0.0–0.2)

## 2019-05-13 LAB — MAGNESIUM: Magnesium: 2 mg/dL (ref 1.7–2.4)

## 2019-05-13 LAB — OSMOLALITY: Osmolality: 258 mOsm/kg — ABNORMAL LOW (ref 275–295)

## 2019-05-13 MED ORDER — MAGNESIUM SULFATE 2 GM/50ML IV SOLN
2.0000 g | Freq: Once | INTRAVENOUS | Status: AC
  Administered 2019-05-13: 2 g via INTRAVENOUS
  Filled 2019-05-13: qty 50

## 2019-05-13 MED ORDER — ACETAMINOPHEN 325 MG PO TABS
650.0000 mg | ORAL_TABLET | Freq: Four times a day (QID) | ORAL | Status: DC | PRN
  Filled 2019-05-13: qty 2

## 2019-05-13 MED ORDER — POTASSIUM CHLORIDE CRYS ER 20 MEQ PO TBCR
40.0000 meq | EXTENDED_RELEASE_TABLET | Freq: Every day | ORAL | Status: DC
  Administered 2019-05-14: 40 meq via ORAL
  Filled 2019-05-13 (×2): qty 2

## 2019-05-13 MED ORDER — SODIUM CHLORIDE 0.9 % IV SOLN
1.0000 g | INTRAVENOUS | Status: DC
  Administered 2019-05-13 – 2019-05-15 (×3): 1 g via INTRAVENOUS
  Filled 2019-05-13 (×3): qty 10

## 2019-05-13 MED ORDER — POTASSIUM CHLORIDE CRYS ER 20 MEQ PO TBCR
40.0000 meq | EXTENDED_RELEASE_TABLET | Freq: Once | ORAL | Status: AC
  Administered 2019-05-13: 12:00:00 40 meq via ORAL
  Filled 2019-05-13: qty 2

## 2019-05-13 MED ORDER — PREDNISONE 5 MG PO TABS
5.0000 mg | ORAL_TABLET | Freq: Two times a day (BID) | ORAL | Status: DC
  Administered 2019-05-13 – 2019-05-26 (×27): 5 mg via ORAL
  Filled 2019-05-13 (×28): qty 1

## 2019-05-13 MED ORDER — MORPHINE SULFATE ER 15 MG PO TBCR
15.0000 mg | EXTENDED_RELEASE_TABLET | Freq: Every morning | ORAL | Status: DC
  Administered 2019-05-13 – 2019-05-15 (×3): 15 mg via ORAL
  Filled 2019-05-13 (×4): qty 1

## 2019-05-13 MED ORDER — SODIUM CHLORIDE 0.9 % IV SOLN
INTRAVENOUS | Status: DC
  Administered 2019-05-13: 01:00:00 via INTRAVENOUS

## 2019-05-13 MED ORDER — POTASSIUM CHLORIDE CRYS ER 20 MEQ PO TBCR
40.0000 meq | EXTENDED_RELEASE_TABLET | ORAL | Status: AC
  Administered 2019-05-13 (×3): 40 meq via ORAL
  Filled 2019-05-13 (×3): qty 2

## 2019-05-13 MED ORDER — VITAMIN D 25 MCG (1000 UNIT) PO TABS
2000.0000 [IU] | ORAL_TABLET | Freq: Every day | ORAL | Status: DC
  Administered 2019-05-13 – 2019-05-26 (×14): 2000 [IU] via ORAL
  Filled 2019-05-13 (×14): qty 2

## 2019-05-13 MED ORDER — UMECLIDINIUM BROMIDE 62.5 MCG/INH IN AEPB
1.0000 | INHALATION_SPRAY | Freq: Every day | RESPIRATORY_TRACT | Status: DC
  Administered 2019-05-13 – 2019-05-26 (×14): 1 via RESPIRATORY_TRACT
  Filled 2019-05-13 (×3): qty 7

## 2019-05-13 MED ORDER — FLUTICASONE FUROATE-VILANTEROL 200-25 MCG/INH IN AEPB
1.0000 | INHALATION_SPRAY | Freq: Every day | RESPIRATORY_TRACT | Status: DC
  Administered 2019-05-14 – 2019-05-26 (×13): 1 via RESPIRATORY_TRACT
  Filled 2019-05-13 (×2): qty 28

## 2019-05-13 MED ORDER — FOLIC ACID 1 MG PO TABS
1.0000 mg | ORAL_TABLET | Freq: Every day | ORAL | Status: DC
  Administered 2019-05-13 – 2019-05-26 (×14): 1 mg via ORAL
  Filled 2019-05-13 (×14): qty 1

## 2019-05-13 MED ORDER — FUROSEMIDE 10 MG/ML IJ SOLN
40.0000 mg | Freq: Every day | INTRAMUSCULAR | Status: DC
  Administered 2019-05-13 – 2019-05-15 (×3): 40 mg via INTRAVENOUS
  Filled 2019-05-13 (×3): qty 4

## 2019-05-13 MED ORDER — HYDROCODONE-ACETAMINOPHEN 5-325 MG PO TABS
1.0000 | ORAL_TABLET | Freq: Four times a day (QID) | ORAL | Status: DC | PRN
  Administered 2019-05-13 – 2019-05-19 (×2): 2 via ORAL
  Filled 2019-05-13 (×2): qty 2

## 2019-05-13 MED ORDER — MORPHINE SULFATE ER 15 MG PO TBCR
30.0000 mg | EXTENDED_RELEASE_TABLET | Freq: Every day | ORAL | Status: DC
  Filled 2019-05-13: qty 2

## 2019-05-13 NOTE — ED Notes (Signed)
Pure Wick was placed on patient. 

## 2019-05-13 NOTE — Plan of Care (Signed)
  Problem: Education: Goal: Knowledge of warning signs, risks, and behaviors that relate to suicide ideation and self-harm behaviors will improve Outcome: Progressing   Problem: Health Behavior/Discharge (Transition) Planning: Goal: Ability to manage health-related needs will improve Outcome: Progressing   Problem: Clinical Measurements: Goal: Remain free from any harm during hospitalization Outcome: Progressing   Problem: Nutrition: Goal: Adequate fluids and nutrition will be maintained Outcome: Progressing   Problem: Coping: Goal: Ability to disclose and discuss thoughts of suicide and self-harm will improve Outcome: Progressing   Problem: Medication Management: Goal: Adhere to prescribed medication regimen Outcome: Progressing   Problem: Sleep Hygiene: Goal: Ability to obtain adequate restful sleep will improve Outcome: Progressing   Problem: Self Esteem: Goal: Ability to verbalize positive feeling about self will improve Outcome: Progressing   Problem: Education: Goal: Knowledge of General Education information will improve Description: Including pain rating scale, medication(s)/side effects and non-pharmacologic comfort measures Outcome: Progressing   Problem: Health Behavior/Discharge Planning: Goal: Ability to manage health-related needs will improve Outcome: Progressing   Problem: Clinical Measurements: Goal: Ability to maintain clinical measurements within normal limits will improve Outcome: Progressing Goal: Will remain free from infection Outcome: Progressing Goal: Diagnostic test results will improve Outcome: Progressing Goal: Respiratory complications will improve Outcome: Progressing Goal: Cardiovascular complication will be avoided Outcome: Progressing   Problem: Activity: Goal: Risk for activity intolerance will decrease Outcome: Progressing   Problem: Nutrition: Goal: Adequate nutrition will be maintained Outcome: Progressing   Problem:  Coping: Goal: Level of anxiety will decrease Outcome: Progressing   Problem: Elimination: Goal: Will not experience complications related to bowel motility Outcome: Progressing Goal: Will not experience complications related to urinary retention Outcome: Progressing   Problem: Pain Managment: Goal: General experience of comfort will improve Outcome: Progressing   Problem: Safety: Goal: Ability to remain free from injury will improve Outcome: Progressing   Problem: Skin Integrity: Goal: Risk for impaired skin integrity will decrease Outcome: Progressing   Problem: Education: Goal: Knowledge of disease or condition will improve Outcome: Progressing Goal: Knowledge of the prescribed therapeutic regimen will improve Outcome: Progressing Goal: Individualized Educational Video(s) Outcome: Progressing   Problem: Activity: Goal: Ability to tolerate increased activity will improve Outcome: Progressing Goal: Will verbalize the importance of balancing activity with adequate rest periods Outcome: Progressing   Problem: Respiratory: Goal: Ability to maintain a clear airway will improve Outcome: Progressing Goal: Levels of oxygenation will improve Outcome: Progressing Goal: Ability to maintain adequate ventilation will improve Outcome: Progressing   

## 2019-05-13 NOTE — ED Notes (Signed)
Patient refused a Snack. A Diet was ordered for Lunch.

## 2019-05-13 NOTE — Consult Note (Addendum)
Telepsych Consultation   Reason for Consult: "patient was recommended inpatient psych in ER by tele psych but then admitted to hospital" Referring Physician:  Dr. Eulogio Bear Location of Patient: [facility] Location of Provider: [facility]  Patient Identification: Kristina May MRN:  [id number] Principal Diagnosis: MDD (major depressive disorder), recurrent severe, without psychosis ([place]) Diagnosis:  Principal Problem:   Hypokalemia Active Problems:   QT prolongation   UTI (urinary tract infection)   Suicidal ideation   Venous stasis   Total Time spent with patient: 1 hour  Subjective:   Kristina May is a 83 y.o. female patient admitted with persistent hypokalemia.  HPI:   Per chart review, patient was admitted with persistent hypokalemia and QT prolongation on EKG due to poor medication compliance for 2 weeks. She has been refusing her medications. She stopped her medications because she does not see a point in living anymore and wanted to die. She reports that she is tired of living with chronic pain. She was initially seen by TTS on 8/10 for SI and accepted to [facility] for treatment but it was cancelled due to hypokalemia. She stated to TTS, "My condition is so lousy. 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 I just figured the hell with that!  I been feeling like this for a while.  Last night I threatened to sign myself out and throw myself under a bus."     On interview, Kristina May reports "I am disgusted. I stopped taking medications but it made it worst. The last couple years I have suffered with rheumatoid arthritis. I have suffered through treatments that didn't work." She reports that she is not depressed but she is mad. She reports that she is physically limited due to her chronic pain. She uses a wheelchair to ambulate. She does not leave the house often. She denies current SI, HI or AVH. She denies problems with appetite. She  reports problems with initiating sleep and maintaining sleep. She was informed of medications to assist with sleep but declines because "I already take too many medications."   Past Psychiatric History: History of prior suicide attempt by overdose but did not seek treatment years ago.   Risk to Self: Suicidal Ideation: Yes-Currently Present Suicidal Intent: (passive) Is patient at risk for suicide?: Yes Suicidal Plan?: Yes-Currently Present Specify Current Suicidal Plan: stop taking medications Access to Means: Yes Specify Access to Suicidal Means: (stopped taking meds) What has been your use of drugs/alcohol within the last 12 months?: (denies) How many times?: 1 Other Self Harm Risks: none known Triggers for Past Attempts: (depression) Intentional Self Injurious Behavior: None Risk to Others: Homicidal Ideation: No Thoughts of Harm to Others: No Current Homicidal Intent: No Current Homicidal Plan: No Access to Homicidal Means: No History of harm to others?: No Assessment of Violence: None Noted Does patient have access to weapons?: No Criminal Charges Pending?: No Does patient have a court date: No Prior Inpatient Therapy: Prior Inpatient Therapy: No Prior Outpatient Therapy: Prior Outpatient Therapy: No Does patient have an ACCT team?: No Does patient have Intensive In-House Services?  : No Does patient have [facility] services? : No Does patient have P4CC services?: No  Past Medical History:  Past Medical History:  Diagnosis Date  . COPD (chronic obstructive pulmonary disease) ([place])   . Dyslipidemia   . Hypertension   . Rheumatoid arthritis(714.0)   . Thyroid disease     Past Surgical History:  Procedure Laterality Date  . FOOT SURGERY     Family History:  Family History  Problem Relation Age of Onset  . Multiple sclerosis Other    Family Psychiatric  History: Denies  Social History:  Social History   Substance and Sexual Activity  Alcohol Use Yes   Comment:  Occ     Social History   Substance and Sexual Activity  Drug Use No    Social History   Socioeconomic History  . Marital status: Widowed    Spouse name: Not on file  . Number of children: Not on file  . Years of education: Not on file  . Highest education level: Not on file  Occupational History  . Not on file  Social Needs  . Financial resource strain: Not on file  . Food insecurity    Worry: Not on file    Inability: Not on file  . Transportation needs    Medical: Not on file    Non-medical: Not on file  Tobacco Use  . Smoking status: Former Smoker  Substance and Sexual Activity  . Alcohol use: Yes    Comment: Occ  . Drug use: No  . Sexual activity: Not on file  Lifestyle  . Physical activity    Days per week: Not on file    Minutes per session: Not on file  . Stress: Not on file  Relationships  . Social Musician on phone: Not on file    Gets together: Not on file    Attends religious service: Not on file    Active member of club or organization: Not on file    Attends meetings of clubs or organizations: Not on file    Relationship status: Not on file  Other Topics Concern  . Not on file  Social History Narrative  . Not on file   Additional Social History: She is from [place] and was raised in [place]. She lives with her daughter and son-in-law. She denies alcohol or illicit substance use.     Allergies:   Allergies  Allergen Reactions  . Latex Other (See Comments)    Causes blisters  . Betamethasone Hives  . Dexamethasone Hives  . Tape Other (See Comments)    Caused blisters   . Tylenol [Acetaminophen] Other (See Comments)    Doesn't work for patient   . Remicade [Infliximab] Other (See Comments)    She went to rehab because of it  . Claritin [Loratadine] Other (See Comments)    Doesn't work for patient    Labs:  Results for orders placed or performed during the hospital encounter of 05/11/19 (from the past 48 hour(s))  CBC      Status: Abnormal   Collection Time: 05/11/19  1:58 PM  Result Value Ref Range   WBC 12.5 (H) 4.0 - 10.5 K/uL   RBC 4.91 3.87 - 5.11 MIL/uL   Hemoglobin 14.4 12.0 - 15.0 g/dL   HCT 16.1 09.6 - 04.5 %   MCV 87.8 80.0 - 100.0 fL   MCH 29.3 26.0 - 34.0 pg   MCHC 33.4 30.0 - 36.0 g/dL   RDW 40.9 81.1 - 91.4 %   Platelets 254 150 - 400 K/uL   nRBC 0.0 0.0 - 0.2 %    Comment: Performed at [facility], [address] [place], [place] [address]  Basic metabolic panel     Status: Abnormal   Collection Time: 05/11/19  1:58 PM  Result  Value Ref Range   Sodium 132 (L) 135 - 145 mmol/L   Potassium 2.9 (L) 3.5 - 5.1 mmol/L   Chloride 92 (L) 98 - 111 mmol/L   CO2 25 22 - 32 mmol/L   Glucose, Bld 140 (H) 70 - 99 mg/dL   BUN 9 8 - 23 mg/dL   Creatinine, Ser 7.89 (H) 0.44 - 1.00 mg/dL   Calcium 9.0 8.9 - 38.1 mg/dL   GFR calc non Af Amer 45 (L) >60 mL/min   GFR calc Af Amer 52 (L) >60 mL/min   Anion gap 15 5 - 15    Comment: Performed at [facility], [address] [place], [place] [address]  T4, free     Status: None   Collection Time: 05/11/19  1:58 PM  Result Value Ref Range   Free T4 0.78 0.61 - 1.12 ng/dL    Comment: (NOTE) Biotin ingestion may interfere with free T4 tests. If the results are inconsistent with the TSH level, previous test results, or the clinical presentation, then consider biotin interference. If needed, order repeat testing after stopping biotin. Performed at [facility], [address], [place], [place] [address]   TSH     Status: Abnormal   Collection Time: 05/11/19  3:16 PM  Result Value Ref Range   TSH 14.740 (H) 0.350 - 4.500 uIU/mL    Comment: Performed by a 3rd Generation assay with a functional sensitivity of <=0.01 uIU/mL. Performed at [facility], [address], [place], [place] [address]   SARS Coronavirus 2 [facility] order, Performed in [facility] hospital lab) Nasopharyngeal Nasopharyngeal Swab     Status: None    Collection Time: 05/12/19  5:40 AM   Specimen: Nasopharyngeal Swab  Result Value Ref Range   SARS Coronavirus 2 NEGATIVE NEGATIVE    Comment: (NOTE) If result is NEGATIVE SARS-CoV-2 target nucleic acids are NOT DETECTED. The SARS-CoV-2 RNA is generally detectable in upper and lower  respiratory specimens during the acute phase of infection. The lowest  concentration of SARS-CoV-2 viral copies this assay can detect is 250  copies / mL. A negative result does not preclude SARS-CoV-2 infection  and should not be used as the sole basis for treatment or other  patient management decisions.  A negative result may occur with  improper specimen collection / handling, submission of specimen other  than nasopharyngeal swab, presence of viral mutation(s) within the  areas targeted by this assay, and inadequate number of viral copies  (<250 copies / mL). A negative result must be combined with clinical  observations, patient history, and epidemiological information. If result is POSITIVE SARS-CoV-2 target nucleic acids are DETECTED. The SARS-CoV-2 RNA is generally detectable in upper and lower  respiratory specimens dur ing the acute phase of infection.  Positive  results are indicative of active infection with SARS-CoV-2.  Clinical  correlation with patient history and other diagnostic information is  necessary to determine patient infection status.  Positive results do  not rule out bacterial infection or co-infection with other viruses. If result is PRESUMPTIVE POSTIVE SARS-CoV-2 nucleic acids MAY BE PRESENT.   A presumptive positive result was obtained on the submitted specimen  and confirmed on repeat testing.  While 2019 novel coronavirus  (SARS-CoV-2) nucleic acids may be present in the submitted sample  additional confirmatory testing may be necessary for epidemiological  and / or clinical management purposes  to differentiate between  SARS-CoV-2 and other Sarbecovirus currently known  to infect humans.  If clinically indicated additional testing with an alternate test  methodology [phone]) is advised. The SARS-CoV-2 RNA is generally  detectable in upper and lower respiratory sp ecimens during the acute  phase of infection. The expected result is Negative. Fact Sheet for Patients:  BoilerBrush.com.cy Fact Sheet for Healthcare Providers: https://pope.com/ This test is not yet approved or cleared by the [place] FDA and has been authorized for detection and/or diagnosis of SARS-CoV-2 by FDA under an Emergency Use Authorization (EUA).  This EUA will remain in effect (meaning this test can be used) for the duration of the COVID-19 declaration under Section 564(b)(1) of the Act, 21 U.S.C. section 360bbb-3(b)(1), unless the authorization is terminated or revoked sooner. Performed at [facility], [address] [place], [place] [address]   Urinalysis, Routine w reflex microscopic     Status: Abnormal   Collection Time: 05/12/19  8:52 AM  Result Value Ref Range   Color, Urine YELLOW YELLOW   APPearance HAZY (A) CLEAR   Specific Gravity, Urine 1.015 1.005 - 1.030   pH 5.0 5.0 - 8.0   Glucose, UA NEGATIVE NEGATIVE mg/dL   Hgb urine dipstick NEGATIVE NEGATIVE   Bilirubin Urine NEGATIVE NEGATIVE   Ketones, ur NEGATIVE NEGATIVE mg/dL   Protein, ur NEGATIVE NEGATIVE mg/dL   Nitrite POSITIVE (A) NEGATIVE   Leukocytes,Ua NEGATIVE NEGATIVE   RBC / HPF 0-5 0 - 5 RBC/hpf   WBC, UA 0-5 0 - 5 WBC/hpf   Bacteria, UA RARE (A) NONE SEEN   Squamous Epithelial / LPF 0-5 0 - 5   Mucus PRESENT    Hyaline Casts, UA PRESENT     Comment: Performed at [facility], [address] [place], [place] [address]  Rapid urine drug screen (hospital performed)     Status: None   Collection Time: 05/12/19  8:52 AM  Result Value Ref Range   Opiates NONE DETECTED NONE DETECTED   Cocaine NONE DETECTED NONE DETECTED    Benzodiazepines NONE DETECTED NONE DETECTED   Amphetamines NONE DETECTED NONE DETECTED   Tetrahydrocannabinol NONE DETECTED NONE DETECTED   Barbiturates NONE DETECTED NONE DETECTED    Comment: (NOTE) DRUG SCREEN FOR MEDICAL PURPOSES ONLY.  IF CONFIRMATION IS NEEDED FOR ANY PURPOSE, NOTIFY LAB WITHIN 5 DAYS. LOWEST DETECTABLE LIMITS FOR URINE DRUG SCREEN Drug Class                     Cutoff (ng/mL) Amphetamine and metabolites    1000 Barbiturate and metabolites    200 Benzodiazepine                 200 Tricyclics and metabolites     300 Opiates and metabolites        300 Cocaine and metabolites        300 THC                            50 Performed at [facility], [address], [place], [place] [address]   Potassium     Status: Abnormal   Collection Time: 05/12/19  9:23 AM  Result Value Ref Range   Potassium 2.5 (LL) 3.5 - 5.1 mmol/L    Comment: CRITICAL RESULT CALLED TO, READ BACK BY AND VERIFIED WITH: BERMAN,B RN  ON [id number] BY FLEMINGS Performed at [facility], [address] [place], [place] [address]   Magnesium  Status: None   Collection Time: 05/12/19  9:23 AM  Result Value Ref Range   Magnesium 1.7 1.7 - 2.4 mg/dL    Comment: Performed at [facility], [address] [place], [place] [address]  Potassium     Status: Abnormal   Collection Time: 05/12/19  2:06 PM  Result Value Ref Range   Potassium 2.9 (L) 3.5 - 5.1 mmol/L    Comment: Performed at [facility], [address] [place], [place] [address]  Potassium     Status: Abnormal   Collection Time: 05/12/19  6:08 PM  Result Value Ref Range   Potassium 2.7 (LL) 3.5 - 5.1 mmol/L    Comment: CRITICAL RESULT CALLED TO, READ BACK BY AND VERIFIED WITHChristell Constant 1911 05/12/2019 WBOND Performed at [facility], [address] [place], [place] [address]   Basic metabolic panel     Status: Abnormal   Collection Time: 05/12/19  9:38 PM  Result Value Ref Range   Sodium  128 (L) 135 - 145 mmol/L   Potassium 2.7 (LL) 3.5 - 5.1 mmol/L    Comment: CRITICAL RESULT CALLED TO, READ BACK BY AND VERIFIED WITH: DAVIS M,RN 05/12/19 2319 WAYK    Chloride 89 (L) 98 - 111 mmol/L   CO2 26 22 - 32 mmol/L   Glucose, Bld 130 (H) 70 - 99 mg/dL   BUN 9 8 - 23 mg/dL   Creatinine, Ser 1.30 (H) 0.44 - 1.00 mg/dL   Calcium 8.3 (L) 8.9 - 10.3 mg/dL   GFR calc non Af Amer 47 (L) >60 mL/min   GFR calc Af Amer 55 (L) >60 mL/min   Anion gap 13 5 - 15    Comment: Performed at [facility], [address] [place], [place] [address]  Magnesium     Status: None   Collection Time: 05/13/19  4:33 AM  Result Value Ref Range   Magnesium 2.0 1.7 - 2.4 mg/dL    Comment: Performed at [facility], [address] [place], [place] [address]  Basic metabolic panel     Status: Abnormal   Collection Time: 05/13/19  4:33 AM  Result Value Ref Range   Sodium 127 (L) 135 - 145 mmol/L   Potassium 3.0 (L) 3.5 - 5.1 mmol/L   Chloride 90 (L) 98 - 111 mmol/L   CO2 23 22 - 32 mmol/L   Glucose, Bld 143 (H) 70 - 99 mg/dL   BUN 9 8 - 23 mg/dL   Creatinine, Ser 9.52 (H) 0.44 - 1.00 mg/dL   Calcium 8.1 (L) 8.9 - 10.3 mg/dL   GFR calc non Af Amer 49 (L) >60 mL/min   GFR calc Af Amer 56 (L) >60 mL/min   Anion gap 14 5 - 15    Comment: Performed at [facility], [address] [place], [place] [address]  CBC     Status: Abnormal   Collection Time: 05/13/19  4:33 AM  Result Value Ref Range   WBC 10.8 (H) 4.0 - 10.5 K/uL   RBC 4.32 3.87 - 5.11 MIL/uL   Hemoglobin 12.8 12.0 - 15.0 g/dL   HCT 44.0 10.2 - 72.5 %   MCV 88.9 80.0 - 100.0 fL   MCH 29.6 26.0 - 34.0 pg   MCHC 33.3 30.0 - 36.0 g/dL   RDW 36.6 44.0 - 34.7 %   Platelets 249 150 - 400 K/uL   nRBC 0.0 0.0 - 0.2 %  Comment: Performed at [facility], [address] [place], [place] [address]  Osmolality     Status: Abnormal   Collection Time: 05/13/19  4:33 AM  Result Value Ref Range   Osmolality 258 (L) 275 -  295 mOsm/kg    Comment: Performed at [facility], [address] [place], [place] [address]    Medications:  Current Facility-Administered Medications  Medication Dose Route Frequency Provider Last Rate Last Dose  . acetaminophen (TYLENOL) tablet 650 mg  650 mg Oral Q6H PRN Marlin Canary U, DO      . apixaban (ELIQUIS) tablet 5 mg  5 mg Oral BID John Giovanni, MD   5 mg at 05/13/19 1055  . aspirin EC tablet 81 mg  81 mg Oral Daily John Giovanni, MD   81 mg at 05/13/19 1056  . cefTRIAXone (ROCEPHIN) 1 g in sodium chloride 0.9 % 100 mL IVPB  1 g Intravenous Q24H John Giovanni, MD   Stopped at 05/13/19 [phone]  . cholecalciferol (VITAMIN D3) tablet 2,000 Units  2,000 Units Oral Daily John Giovanni, MD   2,000 Units at 05/13/19 1056  . docusate sodium (COLACE) capsule 100 mg  100 mg Oral BID John Giovanni, MD   100 mg at 05/13/19 1058  . [START ON 05/14/2019] fluticasone furoate-vilanterol (BREO ELLIPTA) 200-25 MCG/INH 1 puff  1 puff Inhalation Daily John Giovanni, MD      . folic acid (FOLVITE) tablet 1 mg  1 mg Oral Daily John Giovanni, MD   1 mg at 05/13/19 1055  . furosemide (LASIX) injection 40 mg  40 mg Intravenous Daily Vann, Jessica U, DO   40 mg at 05/13/19 1057  . guaiFENesin (MUCINEX) 12 hr tablet 600 mg  600 mg Oral BID John Giovanni, MD   600 mg at 05/13/19 1055  . ipratropium-albuterol (DUONEB) 0.5-2.5 (3) MG/3ML nebulizer solution 3 mL  3 mL Nebulization Q6H PRN John Giovanni, MD   3 mL at 05/13/19 1131  . leflunomide (ARAVA) tablet 10 mg  10 mg Oral Daily John Giovanni, MD   10 mg at 05/13/19 1054  . levothyroxine (SYNTHROID) tablet 150 mcg  150 mcg Oral QAC breakfast John Giovanni, MD   150 mcg at 05/13/19 1914  . metoprolol succinate (TOPROL-XL) 24 hr tablet 50 mg  50 mg Oral BID John Giovanni, MD   50 mg at 05/13/19 1055  . morphine (MS CONTIN) 12 hr tablet 15 mg  15 mg Oral q morning - 10a John Giovanni, MD   15  mg at 05/13/19 1054  . morphine (MS CONTIN) 12 hr tablet 30 mg  30 mg Oral QHS John Giovanni, MD      . Melene Muller ON 05/14/2019] potassium chloride SA (K-DUR) CR tablet 40 mEq  40 mEq Oral Daily Vann, Jessica U, DO      . pravastatin (PRAVACHOL) tablet 40 mg  40 mg Oral QHS John Giovanni, MD   40 mg at 05/12/19 2326  . predniSONE (DELTASONE) tablet 5 mg  5 mg Oral BID WC John Giovanni, MD   5 mg at 05/13/19 0855  . umeclidinium bromide (INCRUSE ELLIPTA) 62.5 MCG/INH 1 puff  1 puff Inhalation Daily Marlin Canary U, DO   1 puff at 05/13/19 1213   Current Outpatient Medications  Medication Sig Dispense Refill  . apixaban (ELIQUIS) 5 MG TABS tablet Take 5 mg by mouth 2 (two) times daily.    Marland Kitchen aspirin EC 81 MG tablet Take 81 mg by mouth  daily.    . Cholecalciferol (VITAMIN D) 2000 UNITS tablet Take 2,000 Units by mouth daily.    Marland Kitchen. docusate sodium (COLACE) 100 MG capsule Take 100 mg by mouth 2 (two) times daily.    . fexofenadine (ALLEGRA) 180 MG tablet Take 180 mg by mouth daily.    . Fluticasone-Salmeterol (WIXELA INHUB) 250-50 MCG/DOSE AEPB Inhale 1 puff into the lungs 2 (two) times daily.    . folic acid (FOLVITE) 1 MG tablet Take 1 mg by mouth daily.    . furosemide (LASIX) 20 MG tablet Take 20 mg by mouth daily.    Marland Kitchen. guaiFENesin (MUCINEX) 600 MG 12 hr tablet Take by mouth 2 (two) times daily.    Marland Kitchen. ipratropium-albuterol (DUONEB) 0.5-2.5 (3) MG/3ML SOLN Take 3 mLs by nebulization every 6 (six) hours as needed (wheezing).     Marland Kitchen. leflunomide (ARAVA) 10 MG tablet Take 10 mg by mouth daily.     Marland Kitchen. levothyroxine (SYNTHROID) 150 MCG tablet Take 150 mcg by mouth daily before breakfast.     . metolazone (ZAROXOLYN) 2.5 MG tablet Take 2.5 mg by mouth 2 (two) times a week.     . metoprolol succinate (TOPROL-XL) 50 MG 24 hr tablet Take 50 mg by mouth 2 (two) times daily. Take with or immediately following a meal.    . naproxen sodium (ALEVE) 220 MG tablet Take 220 mg by mouth daily as needed  (pain).    . potassium chloride SA (K-DUR,KLOR-CON) 20 MEQ tablet Take 20 mEq by mouth daily.     . pravastatin (PRAVACHOL) 40 MG tablet Take 40 mg by mouth at bedtime.    . predniSONE (DELTASONE) 5 MG tablet Take 5 mg by mouth 2 (two) times daily with a meal.    . Sennosides (SENNA-MAX PO) Take 1 tablet by mouth daily as needed (constipation).     Marland Kitchen. tiotropium (SPIRIVA) 18 MCG inhalation capsule Place 18 mcg into inhaler and inhale daily.      Musculoskeletal: Strength & Muscle Tone: No atrophy noted. Gait & Station: UTA since patient is sitting in bed. Patient leans: N/A  Psychiatric Specialty Exam: Physical Exam  Nursing note and vitals reviewed. Constitutional: She is oriented to person, place, and time. She appears well-developed and well-nourished.  HENT:  Head: Normocephalic and atraumatic.  Neck: Normal range of motion.  Respiratory: Effort normal.  Musculoskeletal: Normal range of motion.  Neurological: She is alert and oriented to person, place, and time.  Psychiatric: Her speech is normal and behavior is normal. Judgment and thought content normal. Cognition and memory are normal. She exhibits a depressed mood.    Review of Systems  Gastrointestinal: Positive for constipation. Negative for diarrhea, nausea and vomiting.  Psychiatric/Behavioral: Negative for depression, hallucinations, substance abuse and suicidal ideas. The patient has insomnia.   All other systems reviewed and are negative.   Blood pressure 125/88, pulse 82, temperature 98.1 F (36.7 C), temperature source Oral, resp. rate (!) 25, height 5\' 5"  (1.651 m), weight 113.4 kg, SpO2 98 %.Body mass index is 41.6 kg/m.  General Appearance: Fairly Groomed, elderly, Caucasian female, wearing a hospital gown who is sitting in bed. NAD.   Eye Contact:  Good  Speech:  Clear and Coherent and Normal Rate  Volume:  Normal  Mood:  Angry  Affect:  Depressed  Thought Process:  Goal Directed, Linear and Descriptions of  Associations: Intact  Orientation:  Full (Time, Place, and Person)  Thought Content:  Logical  Suicidal Thoughts:  No  Homicidal Thoughts:  No  Memory:  Immediate;   Good Recent;   Good Remote;   Good  Judgement:  Fair  Insight:  Fair  Psychomotor Activity:  Normal  Concentration:  Concentration: Good and Attention Span: Good  Recall:  Good  Fund of Knowledge:  Good  Language:  Good  Akathisia:  No  Handed:  Right  AIMS (if indicated):   N/A  Assets:  Communication Skills Housing Resilience Social Support  ADL's:  Impaired  Cognition:  WNL  Sleep:   Poor   Assessment:  Kristina May is a 83 y.o. female who was admitted with persistent hypokalemia and QT prolongation on EKG due to poor medication compliance for 2 weeks. Patient endorses SI and has been neglecting to take her medications to end her life. She appears depressed and identifies her chronic pain issues as a significant stressor. She has a prior history of suicide attempt by overdose. She warrants inpatient psychiatric hospitalization for stabilization and treatment.    Treatment Plan Summary: -Patient warrants inpatient psychiatric hospitalization given high risk of harm to self. -Continue Software engineerbedside sitter.  -Patient declines medication management at this time. Will defer medication management to inpatient psychiatric team and likely her QTc will improve in the interim.  -EKG reviewed and QTc 521 on 8/11. Please closely monitor when starting or increasing QTc prolonging agents.  -Please pursue involuntary commitment if patient refuses voluntary psychiatric hospitalization or attempts to leave the hospital.  -Will sign off on patient at this time. Please consult psychiatry again as needed.    Disposition: Recommend psychiatric Inpatient admission when medically cleared.  This service was provided via telemedicine using a 2-way, interactive audio and video technology.  Names of all persons participating in this  telemedicine service and their role in this encounter. Name: Juanetta BeetsJacqueline Pratyush Ammon, DO Role: Psychiatrist  Name: Kristina May  Role: Patient    Cherly BeachJacqueline J Lupe Bonner, DO 05/13/2019 5:27 PM

## 2019-05-13 NOTE — Progress Notes (Signed)
Progress Note    Kristina May  LNZ:[id number] DOB: Feb 02, 1936  DOA: 05/11/2019 PCP: Jettie Pagan, NP    Brief Narrative:     Medical records reviewed and are as summarized below:  Kristina May is an 83 y.o. female with medical history significant of COPD, hypertension, hyperlipidemia, rheumatoid arthritis, hypothyroidism, paroxysmal atrial fibrillation and PE on Eliquis presented to the ED via EMS per family request on May 11, 2019 with a 2-week history of refusal to take medications. Patient told ED provider that she stopped taking medications because she did not see a point in living anymore and wanted to die.  Assessment/Plan:   Principal Problem:   Hypokalemia Active Problems:   QT prolongation   UTI (urinary tract infection)   Suicidal ideation   Venous stasis  Persistent hypokalemia, QT prolongation on EKG -Patient has not taken her home diuretics for the past 2 weeks.   -Potassium continues to be low in the ED despite supplementation (only IV not PO)-- continue to replace - Magnesium level 1.7: Replete magnesium and potassium.  Keep potassium above 4 and magnesium above 2.  UTI UA with negative leukocyte esterase and 0-5 WBCs, however, nitrite positive.  Does have mild leukocytosis. -Ceftriaxone -Urine culture  Suicidal ideation Psychiatry evaluated the patient and recommended inpatient geriatric psych facility.  Pending medical clearance. -Suicide precautions -Sitter at bedside -re-consult psych  Hyponatremia Sodium 128.  Has signs of volume overload on exam. -IV lasix Recheck labs later today  Hypothyroidism Patient has not taken thyroid replacement therapy for the past 2 weeks.  TSH 14.7, free T4 normal. -Resume home Synthroid -Repeat thyroid function studies in 6 weeks  COPD -Continue home inhalers plus PRN duoneb -currently wheezing but seems to be more from volume overload  Paroxysmal atrial fibrillation -Currently in sinus  rhythm.  Continue home Eliquis and metoprolol.  History of PE -Continue home Eliquis  Hyperlipidemia -Continue home pravastatin  Rheumatoid arthritis -Continue home leflunomide and chronic low dose prednisone  Osteoarthritis -Followed by pain management (note under care everywhere).  Continue MS Contin 15 mg every morning and 30 mg nightly.  Morbid obesity Body mass index is 41.6 kg/m.  Echo done in 2019 at [facility]-- will request copy as not in Epic   Family Communication/Anticipated D/C date and plan/Code Status   DVT prophylaxis: eliquis Code Status: Full Code.  Family Communication:  Disposition Plan: pending psych placement and medical readiness   Medical Consultants:    psych     Subjective:   C/o SOB  Objective:    Vitals:   05/13/19 0700 05/13/19 0800 05/13/19 0900 05/13/19 1055  BP: 130/64 130/76 (!) 145/70 126/61  Pulse: 70 85 85 81  Resp: (!) 27 (!) 31 (!) 27   Temp:      TempSrc:      SpO2: 99% 97% 98%   Weight:      Height:        Intake/Output Summary (Last 24 hours) at 05/13/2019 1101 Last data filed at 05/13/2019 5615 Gross per 24 hour  Intake 1773.5 ml  Output -  Net 1773.5 ml   Filed Weights   05/11/19 1354  Weight: 113.4 kg    Exam: In bed, NAD Wheezing b/l, mild increase in work of breathing +LE edema A+Ox3  Data Reviewed:   I have personally reviewed following labs and imaging studies:  Labs: Labs show the following:   Basic Metabolic Panel: Recent Labs  Lab 05/11/19 1358 05/12/19 0923  05/12/19  2138 05/13/19 0433  NA 132*  --   --  128* 127*  K 2.9* 2.5*   < > 2.7* 3.0*  CL 92*  --   --  89* 90*  CO2 25  --   --  26 23  GLUCOSE 140*  --   --  130* 143*  BUN 9  --   --  9 9  CREATININE 1.14*  --   --  1.10* 1.07*  CALCIUM 9.0  --   --  8.3* 8.1*  MG  --  1.7  --   --  2.0   < > = values in this interval not displayed.   GFR Estimated Creatinine Clearance: 51.8 mL/min (A) (by C-G formula  based on SCr of 1.07 mg/dL (H)). Liver Function Tests: No results for input(s): AST, ALT, ALKPHOS, BILITOT, PROT, ALBUMIN in the last 168 hours. No results for input(s): LIPASE, AMYLASE in the last 168 hours. No results for input(s): AMMONIA in the last 168 hours. Coagulation profile No results for input(s): INR, PROTIME in the last 168 hours.  CBC: Recent Labs  Lab 05/11/19 1358 05/13/19 0433  WBC 12.5* 10.8*  HGB 14.4 12.8  HCT 43.1 38.4  MCV 87.8 88.9  PLT 254 249   Cardiac Enzymes: No results for input(s): CKTOTAL, CKMB, CKMBINDEX, TROPONINI in the last 168 hours. BNP (last 3 results) No results for input(s): PROBNP in the last 8760 hours. CBG: No results for input(s): GLUCAP in the last 168 hours. D-Dimer: No results for input(s): DDIMER in the last 72 hours. Hgb A1c: No results for input(s): HGBA1C in the last 72 hours. Lipid Profile: No results for input(s): CHOL, HDL, LDLCALC, TRIG, CHOLHDL, LDLDIRECT in the last 72 hours. Thyroid function studies: Recent Labs    05/11/19 1516  TSH 14.740*   Anemia work up: No results for input(s): VITAMINB12, FOLATE, FERRITIN, TIBC, IRON, RETICCTPCT in the last 72 hours. Sepsis Labs: Recent Labs  Lab 05/11/19 1358 05/13/19 0433  WBC 12.5* 10.8*    Microbiology Recent Results (from the past 240 hour(s))  SARS Coronavirus 2 [facility] order, Performed in [facility] hospital lab) Nasopharyngeal Nasopharyngeal Swab     Status: None   Collection Time: 05/12/19  5:40 AM   Specimen: Nasopharyngeal Swab  Result Value Ref Range Status   SARS Coronavirus 2 NEGATIVE NEGATIVE Final    Comment: (NOTE) If result is NEGATIVE SARS-CoV-2 target nucleic acids are NOT DETECTED. The SARS-CoV-2 RNA is generally detectable in upper and lower  respiratory specimens during the acute phase of infection. The lowest  concentration of SARS-CoV-2 viral copies this assay can detect is 250  copies / mL. A negative result does not preclude  SARS-CoV-2 infection  and should not be used as the sole basis for treatment or other  patient management decisions.  A negative result may occur with  improper specimen collection / handling, submission of specimen other  than nasopharyngeal swab, presence of viral mutation(s) within the  areas targeted by this assay, and inadequate number of viral copies  (<250 copies / mL). A negative result must be combined with clinical  observations, patient history, and epidemiological information. If result is POSITIVE SARS-CoV-2 target nucleic acids are DETECTED. The SARS-CoV-2 RNA is generally detectable in upper and lower  respiratory specimens dur ing the acute phase of infection.  Positive  results are indicative of active infection with SARS-CoV-2.  Clinical  correlation with patient history and other diagnostic information is  necessary to determine  patient infection status.  Positive results do  not rule out bacterial infection or co-infection with other viruses. If result is PRESUMPTIVE POSTIVE SARS-CoV-2 nucleic acids MAY BE PRESENT.   A presumptive positive result was obtained on the submitted specimen  and confirmed on repeat testing.  While 2019 novel coronavirus  (SARS-CoV-2) nucleic acids may be present in the submitted sample  additional confirmatory testing may be necessary for epidemiological  and / or clinical management purposes  to differentiate between  SARS-CoV-2 and other Sarbecovirus currently known to infect humans.  If clinically indicated additional testing with an alternate test  methodology [phone]) is advised. The SARS-CoV-2 RNA is generally  detectable in upper and lower respiratory sp ecimens during the acute  phase of infection. The expected result is Negative. Fact Sheet for Patients:  BoilerBrush.com.cyhttps://www.fda.gov/media/136312/download Fact Sheet for Healthcare Providers: https://pope.com/https://www.fda.gov/media/136313/download This test is not yet approved or cleared by the  [place] FDA and has been authorized for detection and/or diagnosis of SARS-CoV-2 by FDA under an Emergency Use Authorization (EUA).  This EUA will remain in effect (meaning this test can be used) for the duration of the COVID-19 declaration under Section 564(b)(1) of the Act, 21 U.S.C. section 360bbb-3(b)(1), unless the authorization is terminated or revoked sooner. Performed at [facility], [address] [place], [place] [address]     Procedures and diagnostic studies:  Dg Chest 2 View  Result Date: 05/11/2019 CLINICAL DATA:  Pt states she has not taken her BP, cholesterol meds in 2 weeks. pt here from home. Pt is having a COPD exacerbation. Pt has not been compliant with medications. This is not the first time per family. Pt states she doesn't take her meds because this is no way of living. Pt is unable to walk up stairs. Family is requesting psych consult and social work. No chest pain, cough , nor fever per pt, Hx of HTN. EXAM: CHEST - 2 VIEW COMPARISON:  None. FINDINGS: Cardiac silhouette is normal in size. No mediastinal or hilar masses. No evidence of adenopathy. Lungs are hyperexpanded. There are prominent bronchovascular markings with mild interstitial thickening most evident in bases. No evidence of pneumonia or pulmonary edema. No pleural effusion or pneumothorax. Skeletal structures are demineralized but grossly intact. IMPRESSION: 1. No acute cardiopulmonary disease. Electronically Signed   By: Amie Portlandavid  Ormond M.D.   On: 05/11/2019 17:11    Medications:   . apixaban  5 mg Oral BID  . aspirin EC  81 mg Oral Daily  . cholecalciferol  2,000 Units Oral Daily  . docusate sodium  100 mg Oral BID  . [START ON 05/14/2019] fluticasone furoate-vilanterol  1 puff Inhalation Daily  . folic acid  1 mg Oral Daily  . furosemide  40 mg Intravenous Daily  . guaiFENesin  600 mg Oral BID  . leflunomide  10 mg Oral Daily  . levothyroxine  150 mcg Oral QAC breakfast  . metoprolol  succinate  50 mg Oral BID  . morphine  15 mg Oral q morning - 10a  . morphine  30 mg Oral QHS  . potassium chloride  40 mEq Oral Once  . [START ON 05/14/2019] potassium chloride  40 mEq Oral Daily  . pravastatin  40 mg Oral QHS  . predniSONE  5 mg Oral BID WC  . umeclidinium bromide  1 puff Inhalation Daily   Continuous Infusions: . cefTRIAXone (ROCEPHIN)  IV Stopped (05/13/19 0713)     LOS: 0 days   Joseph ArtJessica U   [facility]   How to contact the [facility] Attending or Consulting provider [place] or covering provider during after hours [place], for this patient?  1. Check the care team in [facility] and look for a) attending/consulting TR[facility] provider listed and b) the [facility] team listed 2. Log into [email or web address] and use [place]'s universal password to access. If you do not have the password, please contact the hospital operator. 3. Locate the [facility] provider you are looking for under [facility] and page to a number that you can be directly reached. 4. If you still have difficulty reaching the provider, please page the [facility] (Director on Call) for the Hospitalists listed on amion for assistance.  05/13/2019, 11:01 AM

## 2019-05-13 NOTE — Progress Notes (Addendum)
Attempted to see patient earlier today for televisit and informed that the telepsych machine would be set up. Attempted to call 15 minutes later to complete interview but patient is in transition to another floor so will attempt to see patient again tomorrow.   Buford Dresser, DO 05/13/19 3:12 PM

## 2019-05-13 NOTE — Progress Notes (Signed)
MEDICATION RELATED CONSULT NOTE - INITIAL   Pharmacy Consult for medication review for allergies and drug interactions   Allergies  Allergen Reactions  . Latex Other (See Comments)    Causes blisters  . Betamethasone Hives  . Dexamethasone Hives  . Tape Other (See Comments)    Caused blisters   . Tylenol [Acetaminophen] Other (See Comments)    Doesn't work for patient   . Remicade [Infliximab] Other (See Comments)    She went to rehab because of it  . Claritin [Loratadine] Other (See Comments)    Doesn't work for patient    Patient Measurements: Height: 5\' 5"  (165.1 cm) Weight: 250 lb (113.4 kg) IBW/kg (Calculated) : 57   Vital Signs: Temp: (P) 98.6 F (37 C) (08/11 2330) Temp Source: Oral (08/11 1927) BP: (P) 125/69 (08/11 2330) Pulse Rate: (P) 77 (08/11 2330) Intake/Output from previous day: 08/11 0701 - 08/12 0700 In: 1210.2 [P.O.:600; I.V.:200; IV Piggyback:410.2] Out: -  Intake/Output from this shift: Total I/O In: 110.2 [IV Piggyback:110.2] Out: -    Medical History: Past Medical History:  Diagnosis Date  . COPD (chronic obstructive pulmonary disease) ([place])   . Dyslipidemia   . Hypertension   . Rheumatoid arthritis(714.0)   . Thyroid disease     Medications:  Scheduled:  . apixaban  5 mg Oral BID  . aspirin EC  81 mg Oral Daily  . cholecalciferol  2,000 Units Oral Daily  . docusate sodium  100 mg Oral BID  . fluticasone furoate-vilanterol  1 puff Inhalation Daily  . folic acid  1 mg Oral Daily  . guaiFENesin  600 mg Oral BID  . leflunomide  10 mg Oral Daily  . levothyroxine  150 mcg Oral QAC breakfast  . metoprolol succinate  50 mg Oral BID  . potassium chloride  40 mEq Oral Q4H  . pravastatin  40 mg Oral QHS  . predniSONE  5 mg Oral BID WC  . umeclidinium bromide  1 puff Inhalation Daily    Assessment: 83 yo lady who has been refusing to take medications as she no longer wants to live.  Her previous home medications were ordered, she  should not have any allergies to these.  Her eliquis and ASA together can increase risk of bleeding.  No other drug interactions noted  Continue home meds as ordered  Thanks for allowing pharmacy to be a part of this patient's care.  Excell Seltzer, PharmD Clinical Pharmacist 05/13/2019,1:02 AM

## 2019-05-14 DIAGNOSIS — F332 Major depressive disorder, recurrent severe without psychotic features: Secondary | ICD-10-CM

## 2019-05-14 DIAGNOSIS — N390 Urinary tract infection, site not specified: Secondary | ICD-10-CM

## 2019-05-14 DIAGNOSIS — R9431 Abnormal electrocardiogram [ECG] [EKG]: Secondary | ICD-10-CM

## 2019-05-14 LAB — CBC
HCT: 40.9 % (ref 36.0–46.0)
Hemoglobin: 13.3 g/dL (ref 12.0–15.0)
MCH: 28.8 pg (ref 26.0–34.0)
MCHC: 32.5 g/dL (ref 30.0–36.0)
MCV: 88.5 fL (ref 80.0–100.0)
Platelets: 226 10*3/uL (ref 150–400)
RBC: 4.62 MIL/uL (ref 3.87–5.11)
RDW: 14.1 % (ref 11.5–15.5)
WBC: 9.4 10*3/uL (ref 4.0–10.5)
nRBC: 0 % (ref 0.0–0.2)

## 2019-05-14 LAB — BASIC METABOLIC PANEL
Anion gap: 14 (ref 5–15)
BUN: 8 mg/dL (ref 8–23)
CO2: 24 mmol/L (ref 22–32)
Calcium: 8.5 mg/dL — ABNORMAL LOW (ref 8.9–10.3)
Chloride: 91 mmol/L — ABNORMAL LOW (ref 98–111)
Creatinine, Ser: 1.11 mg/dL — ABNORMAL HIGH (ref 0.44–1.00)
GFR calc Af Amer: 54 mL/min — ABNORMAL LOW (ref >60)
GFR calc non Af Amer: 47 mL/min — ABNORMAL LOW (ref >60)
Glucose, Bld: 131 mg/dL — ABNORMAL HIGH (ref 70–99)
Potassium: 3.6 mmol/L (ref 3.5–5.1)
Sodium: 129 mmol/L — ABNORMAL LOW (ref 135–145)

## 2019-05-14 LAB — MAGNESIUM: Magnesium: 2 mg/dL (ref 1.7–2.4)

## 2019-05-14 MED ORDER — SODIUM CHLORIDE 0.9 % IV SOLN
INTRAVENOUS | Status: DC | PRN
  Administered 2019-05-14 – 2019-05-15 (×2): 250 mL via INTRAVENOUS

## 2019-05-14 MED ORDER — DICLOFENAC SODIUM 1 % TD GEL
2.0000 g | Freq: Four times a day (QID) | TRANSDERMAL | Status: DC
  Administered 2019-05-14 – 2019-05-25 (×15): 2 g via TOPICAL
  Filled 2019-05-14: qty 100

## 2019-05-14 NOTE — Clinical Social Work Note (Signed)
CSW advised by Dr. Eliseo Squires that patient not medically stable for discharge today. Call made to [facility] [phone]) psych facility and talked with admissions staff person Ronalee Belts. Informed him that patient not medically stable for discharge today. Per Ronalee Belts, they cannot hold a bed but will place her information in their disposition file and they can be called once patient is medically stable for discharge. Social work will continue to monitor patient's progress medically and continue search for a psych facility.  Kristina May, MSW, LCSW Licensed Clinical Social Worker [place] [phone]

## 2019-05-14 NOTE — Plan of Care (Signed)
  Problem: Education: Goal: Knowledge of warning signs, risks, and behaviors that relate to suicide ideation and self-harm behaviors will improve Outcome: Progressing   Problem: Health Behavior/Discharge (Transition) Planning: Goal: Ability to manage health-related needs will improve Outcome: Progressing   Problem: Clinical Measurements: Goal: Remain free from any harm during hospitalization Outcome: Progressing   Problem: Nutrition: Goal: Adequate fluids and nutrition will be maintained Outcome: Progressing   Problem: Coping: Goal: Ability to disclose and discuss thoughts of suicide and self-harm will improve Outcome: Progressing   Problem: Medication Management: Goal: Adhere to prescribed medication regimen Outcome: Progressing   Problem: Sleep Hygiene: Goal: Ability to obtain adequate restful sleep will improve Outcome: Progressing   Problem: Self Esteem: Goal: Ability to verbalize positive feeling about self will improve Outcome: Progressing   Problem: Education: Goal: Knowledge of General Education information will improve Description: Including pain rating scale, medication(s)/side effects and non-pharmacologic comfort measures Outcome: Progressing   Problem: Health Behavior/Discharge Planning: Goal: Ability to manage health-related needs will improve Outcome: Progressing   Problem: Clinical Measurements: Goal: Ability to maintain clinical measurements within normal limits will improve Outcome: Progressing Goal: Will remain free from infection Outcome: Progressing Goal: Diagnostic test results will improve Outcome: Progressing Goal: Respiratory complications will improve Outcome: Progressing Goal: Cardiovascular complication will be avoided Outcome: Progressing   Problem: Activity: Goal: Risk for activity intolerance will decrease Outcome: Progressing   Problem: Nutrition: Goal: Adequate nutrition will be maintained Outcome: Progressing   Problem:  Coping: Goal: Level of anxiety will decrease Outcome: Progressing   Problem: Elimination: Goal: Will not experience complications related to bowel motility Outcome: Progressing Goal: Will not experience complications related to urinary retention Outcome: Progressing   Problem: Pain Managment: Goal: General experience of comfort will improve Outcome: Progressing   Problem: Safety: Goal: Ability to remain free from injury will improve Outcome: Progressing   Problem: Skin Integrity: Goal: Risk for impaired skin integrity will decrease Outcome: Progressing   Problem: Education: Goal: Knowledge of disease or condition will improve Outcome: Progressing Goal: Knowledge of the prescribed therapeutic regimen will improve Outcome: Progressing Goal: Individualized Educational Video(s) Outcome: Progressing   Problem: Activity: Goal: Ability to tolerate increased activity will improve Outcome: Progressing Goal: Will verbalize the importance of balancing activity with adequate rest periods Outcome: Progressing   Problem: Respiratory: Goal: Ability to maintain a clear airway will improve Outcome: Progressing Goal: Levels of oxygenation will improve Outcome: Progressing Goal: Ability to maintain adequate ventilation will improve Outcome: Progressing   

## 2019-05-14 NOTE — Progress Notes (Signed)
Progress Note    Kristina May  WCH:[id number] DOB: 1936-01-23  DOA: 05/11/2019 PCP: Tomasa Hose, NP    Brief Narrative:     Medical records reviewed and are as summarized below:  Kristina May is an 83 y.o. female with medical history significant of COPD, hypertension, hyperlipidemia, rheumatoid arthritis, hypothyroidism, paroxysmal atrial fibrillation and PE on Eliquis presented to the ED via EMS per family request on May 11, 2019 with a 2-week history of refusal to take medications. Patient told ED provider that she stopped taking medications because she did not see a point in living anymore and wanted to die.  Assessment/Plan:   Principal Problem:   MDD (major depressive disorder), recurrent severe, without psychosis ([place]) Active Problems:   Hypokalemia   QT prolongation   UTI (urinary tract infection)   Suicidal ideation   Venous stasis  Persistent hypokalemia, QT prolongation on EKG -Patient has not taken her home diuretics for the past 2 weeks.   -repleated -  Keep potassium above 4 and magnesium above 2. -await repeat EKG  UTI UA with negative leukocyte esterase and 0-5 WBCs, however, nitrite positive.  Does have mild leukocytosis. -Ceftriaxone -Urine culture: ecoli  Suicidal ideation Psychiatry evaluated the patient and recommended inpatient geriatric psych facility.  Pending medical clearance. -Suicide precautions -Sitter at bedside  Hyponatremia Has signs of volume overload on exam. -IV lasix -trend -wean off O2 as well (does not wear at home)  Hypothyroidism Patient has not taken thyroid replacement therapy for the past 2 weeks.  TSH 14.7, free T4 normal. -Resume home Synthroid -Repeat thyroid function studies in 6 weeks  COPD -Continue home inhalers plus PRN duoneb -currently wheezing but seems to be more from volume overload -repeat x ray in AM  Paroxysmal atrial fibrillation - Continue home Eliquis and metoprolol.   History of PE -Continue home Eliquis  Hyperlipidemia -Continue home pravastatin  Rheumatoid arthritis -Continue home leflunomide and chronic low dose prednisone -PRN voltaren gel  Osteoarthritis -Followed by pain management (note under care everywhere).  Continue MS Contin 15 mg every morning and 30 mg nightly.  Morbid obesity Body mass index is 43.17 kg/m.  Echo done in 2019 at [facility]-- will request copy as not in Epic   Family Communication/Anticipated D/C date and plan/Code Status   DVT prophylaxis: eliquis Code Status: Full Code.  Family Communication:  Disposition Plan: pending psych placement once medically ready   Medical Consultants:    psych     Subjective:   C/o dry lips  Objective:    Vitals:   05/14/19 0014 05/14/19 0557 05/14/19 0841 05/14/19 1248  BP: 110/63 135/68 (!) 143/59 132/63  Pulse: 81 81 83 82  Resp:  (!) 22 20   Temp:  98.8 F (37.1 C) 98.9 F (37.2 C) 99.3 F (37.4 C)  TempSrc:  Oral Oral Oral  SpO2: 96% 96% 95% 96%  Weight:  117.7 kg    Height:        Intake/Output Summary (Last 24 hours) at 05/14/2019 1425 Last data filed at 05/14/2019 1003 Gross per 24 hour  Intake 701.41 ml  Output 1551 ml  Net -849.59 ml   Filed Weights   05/11/19 1354 05/13/19 1544 05/14/19 0557  Weight: 113.4 kg 112.9 kg 117.7 kg    Exam: In bed, sitter at bedside Wheezing but appears much more consistent with upper airways rrr +LE edema, legs tender to the touch   Data Reviewed:   I have personally reviewed following  labs and imaging studies:  Labs: Labs show the following:   Basic Metabolic Panel: Recent Labs  Lab 05/11/19 1358 05/12/19 0923  05/12/19 2138 05/13/19 0433 05/13/19 1400 05/14/19 0616  NA 132*  --   --  128* 127* 127* 129*  K 2.9* 2.5*   < > 2.7* 3.0* 4.0 3.6  CL 92*  --   --  89* 90* 90* 91*  CO2 25  --   --  26 23 24 24   GLUCOSE 140*  --   --  130* 143* 182* 131*  BUN 9  --   --  9 9 9 8    CREATININE 1.14*  --   --  1.10* 1.07* 1.15* 1.11*  CALCIUM 9.0  --   --  8.3* 8.1* 8.2* 8.5*  MG  --  1.7  --   --  2.0  --  2.0   < > = values in this interval not displayed.   GFR Estimated Creatinine Clearance: 51 mL/min (A) (by C-G formula based on SCr of 1.11 mg/dL (H)). Liver Function Tests: No results for input(s): AST, ALT, ALKPHOS, BILITOT, PROT, ALBUMIN in the last 168 hours. No results for input(s): LIPASE, AMYLASE in the last 168 hours. No results for input(s): AMMONIA in the last 168 hours. Coagulation profile No results for input(s): INR, PROTIME in the last 168 hours.  CBC: Recent Labs  Lab 05/11/19 1358 05/13/19 0433 05/14/19 0616  WBC 12.5* 10.8* 9.4  HGB 14.4 12.8 13.3  HCT 43.1 38.4 40.9  MCV 87.8 88.9 88.5  PLT 254 249 226   Cardiac Enzymes: No results for input(s): CKTOTAL, CKMB, CKMBINDEX, TROPONINI in the last 168 hours. BNP (last 3 results) No results for input(s): PROBNP in the last 8760 hours. CBG: No results for input(s): GLUCAP in the last 168 hours. D-Dimer: No results for input(s): DDIMER in the last 72 hours. Hgb A1c: No results for input(s): HGBA1C in the last 72 hours. Lipid Profile: No results for input(s): CHOL, HDL, LDLCALC, TRIG, CHOLHDL, LDLDIRECT in the last 72 hours. Thyroid function studies: Recent Labs    05/11/19 1516  TSH 14.740*   Anemia work up: No results for input(s): VITAMINB12, FOLATE, FERRITIN, TIBC, IRON, RETICCTPCT in the last 72 hours. Sepsis Labs: Recent Labs  Lab 05/11/19 1358 05/13/19 0433 05/14/19 0616  WBC 12.5* 10.8* 9.4    Microbiology Recent Results (from the past 240 hour(s))  SARS Coronavirus 2 [facility] order, Performed in [facility] hospital lab) Nasopharyngeal Nasopharyngeal Swab     Status: None   Collection Time: 05/12/19  5:40 AM   Specimen: Nasopharyngeal Swab  Result Value Ref Range Status   SARS Coronavirus 2 NEGATIVE NEGATIVE Final    Comment: (NOTE) If result is NEGATIVE  SARS-CoV-2 target nucleic acids are NOT DETECTED. The SARS-CoV-2 RNA is generally detectable in upper and lower  respiratory specimens during the acute phase of infection. The lowest  concentration of SARS-CoV-2 viral copies this assay can detect is 250  copies / mL. A negative result does not preclude SARS-CoV-2 infection  and should not be used as the sole basis for treatment or other  patient management decisions.  A negative result may occur with  improper specimen collection / handling, submission of specimen other  than nasopharyngeal swab, presence of viral mutation(s) within the  areas targeted by this assay, and inadequate number of viral copies  (<250 copies / mL). A negative result must be combined with clinical  observations, patient history, and  epidemiological information. If result is POSITIVE SARS-CoV-2 target nucleic acids are DETECTED. The SARS-CoV-2 RNA is generally detectable in upper and lower  respiratory specimens dur ing the acute phase of infection.  Positive  results are indicative of active infection with SARS-CoV-2.  Clinical  correlation with patient history and other diagnostic information is  necessary to determine patient infection status.  Positive results do  not rule out bacterial infection or co-infection with other viruses. If result is PRESUMPTIVE POSTIVE SARS-CoV-2 nucleic acids MAY BE PRESENT.   A presumptive positive result was obtained on the submitted specimen  and confirmed on repeat testing.  While 2019 novel coronavirus  (SARS-CoV-2) nucleic acids may be present in the submitted sample  additional confirmatory testing may be necessary for epidemiological  and / or clinical management purposes  to differentiate between  SARS-CoV-2 and other Sarbecovirus currently known to infect humans.  If clinically indicated additional testing with an alternate test  methodology [phone]([id number]) is advised. The SARS-CoV-2 RNA is generally  detectable in upper  and lower respiratory sp ecimens during the acute  phase of infection. The expected result is Negative. Fact Sheet for Patients:  BoilerBrush.com.cyhttps://www.fda.gov/media/136312/download Fact Sheet for Healthcare Providers: https://pope.com/https://www.fda.gov/media/136313/download This test is not yet approved or cleared by the [place] FDA and has been authorized for detection and/or diagnosis of SARS-CoV-2 by FDA under an Emergency Use Authorization (EUA).  This EUA will remain in effect (meaning this test can be used) for the duration of the COVID-19 declaration under Section 564(b)(1) of the Act, 21 U.S.C. section 360bbb-3(b)(1), unless the authorization is terminated or revoked sooner. Performed at [facility], [address] [place], [place] [address]   Urine Culture     Status: Abnormal (Preliminary result)   Collection Time: 05/12/19 11:00 AM   Specimen: Urine, Random  Result Value Ref Range Status   Specimen Description URINE, RANDOM  Final   Special Requests NONE  Final   Culture (A)  Final    >=100,000 COLONIES/mL ESCHERICHIA COLI SUSCEPTIBILITIES TO FOLLOW Performed at [facility], [address] [place], [place] [address]    Report Status PENDING  Incomplete    Procedures and diagnostic studies:  No results found.  Medications:   . apixaban  5 mg Oral BID  . aspirin EC  81 mg Oral Daily  . cholecalciferol  2,000 Units Oral Daily  . diclofenac sodium  2 g Topical QID  . docusate sodium  100 mg Oral BID  . fluticasone furoate-vilanterol  1 puff Inhalation Daily  . folic acid  1 mg Oral Daily  . furosemide  40 mg Intravenous Daily  . guaiFENesin  600 mg Oral BID  . leflunomide  10 mg Oral Daily  . levothyroxine  150 mcg Oral QAC breakfast  . metoprolol succinate  50 mg Oral BID  . morphine  15 mg Oral q morning - 10a  . morphine  30 mg Oral QHS  . potassium chloride  40 mEq Oral Daily  . pravastatin  40 mg Oral QHS  . predniSONE  5 mg Oral BID WC  . umeclidinium  bromide  1 puff Inhalation Daily   Continuous Infusions: . sodium chloride 250 mL (05/14/19 0553)  . cefTRIAXone (ROCEPHIN)  IV 1 g (05/14/19 0554)     LOS: 1 day   Joseph ArtJessica U Jaylea Plourde  [facility]   How to contact the [facility] Attending or Consulting provider 7A - 7P or covering provider during after hours 7P -7A,  for this patient?  1. Check the care team in [facility] and look for a) attending/consulting TRH provider listed and b) the [facility] team listed 2. Log into [email or web address] and use [place]'s universal password to access. If you do not have the password, please contact the hospital operator. 3. Locate the [facility] provider you are looking for under [facility] and page to a number that you can be directly reached. 4. If you still have difficulty reaching the provider, please page the [facility] (Director on Call) for the Hospitalists listed on amion for assistance.  05/14/2019, 2:25 PM

## 2019-05-15 ENCOUNTER — Encounter (HOSPITAL_COMMUNITY): Payer: Self-pay

## 2019-05-15 ENCOUNTER — Inpatient Hospital Stay (HOSPITAL_COMMUNITY): Payer: Medicare Other

## 2019-05-15 LAB — BASIC METABOLIC PANEL
Anion gap: 12 (ref 5–15)
BUN: 9 mg/dL (ref 8–23)
CO2: 25 mmol/L (ref 22–32)
Calcium: 8.7 mg/dL — ABNORMAL LOW (ref 8.9–10.3)
Chloride: 94 mmol/L — ABNORMAL LOW (ref 98–111)
Creatinine, Ser: 1.06 mg/dL — ABNORMAL HIGH (ref 0.44–1.00)
GFR calc Af Amer: 57 mL/min — ABNORMAL LOW (ref >60)
GFR calc non Af Amer: 49 mL/min — ABNORMAL LOW (ref >60)
Glucose, Bld: 120 mg/dL — ABNORMAL HIGH (ref 70–99)
Potassium: 3.5 mmol/L (ref 3.5–5.1)
Sodium: 131 mmol/L — ABNORMAL LOW (ref 135–145)

## 2019-05-15 LAB — CBC
HCT: 37.6 % (ref 36.0–46.0)
Hemoglobin: 12.5 g/dL (ref 12.0–15.0)
MCH: 28.9 pg (ref 26.0–34.0)
MCHC: 33.2 g/dL (ref 30.0–36.0)
MCV: 86.8 fL (ref 80.0–100.0)
Platelets: 238 10*3/uL (ref 150–400)
RBC: 4.33 MIL/uL (ref 3.87–5.11)
RDW: 14.1 % (ref 11.5–15.5)
WBC: 8.6 10*3/uL (ref 4.0–10.5)
nRBC: 0 % (ref 0.0–0.2)

## 2019-05-15 LAB — URINE CULTURE: Culture: >=100000 — AB

## 2019-05-15 MED ORDER — CEPHALEXIN 250 MG PO CAPS
500.0000 mg | ORAL_CAPSULE | Freq: Two times a day (BID) | ORAL | Status: AC
  Administered 2019-05-16 – 2019-05-17 (×4): 500 mg via ORAL
  Filled 2019-05-15 (×4): qty 2

## 2019-05-15 MED ORDER — CEPHALEXIN 250 MG PO CAPS: 500.0000 mg | ORAL_CAPSULE | Freq: Two times a day (BID) | ORAL | Status: DC

## 2019-05-15 MED ORDER — FUROSEMIDE 10 MG/ML IJ SOLN
40.0000 mg | Freq: Two times a day (BID) | INTRAMUSCULAR | Status: DC
  Administered 2019-05-15 – 2019-05-16 (×2): 40 mg via INTRAVENOUS
  Filled 2019-05-15 (×2): qty 4

## 2019-05-15 MED ORDER — POTASSIUM CHLORIDE CRYS ER 20 MEQ PO TBCR
40.0000 meq | EXTENDED_RELEASE_TABLET | Freq: Two times a day (BID) | ORAL | Status: DC
  Administered 2019-05-15 – 2019-05-26 (×23): 40 meq via ORAL
  Filled 2019-05-15 (×24): qty 2

## 2019-05-15 NOTE — Progress Notes (Signed)
Progress Note    Kristina May  ZOX:[id number] DOB: [birth year]  DOA: 05/11/2019 PCP: Jettie Pagan, NP    Brief Narrative:     Medical records reviewed and are as summarized below:  Kristina May is an 83 y.o. female with medical history significant of COPD, hypertension, hyperlipidemia, rheumatoid arthritis, hypothyroidism, paroxysmal atrial fibrillation and PE on Eliquis presented to the ED via EMS per family request on May 11, 2019 with a 2-week history of refusal to take medications. Patient told ED provider that she stopped taking medications because she did not see a point in living anymore and wanted to die.  Assessment/Plan:   Principal Problem:   MDD (major depressive disorder), recurrent severe, without psychosis (HCC) Active Problems:   Hypokalemia   QT prolongation   UTI (urinary tract infection)   Suicidal ideation   Venous stasis  Persistent hypokalemia, QT prolongation on EKG -Patient has not taken her home diuretics for the past 2 weeks.   -repleated - Keep potassium above 4 and magnesium above 2. -await repeat EKG  UTI UA with negative leukocyte esterase and 0-5 WBCs, however, nitrite positive.  Does have mild leukocytosis. -Ceftriaxone- change to PO abx for total of 5 days -Urine culture: ecoli  Suicidal ideation Psychiatry evaluated the patient and recommended inpatient geriatric psych facility.  Pending medical clearance. -Suicide precautions -Sitter at bedside  Hyponatremia Has signs of volume overload on exam. -IV lasix -trending up with diuresis -wean off O2 as well (does not wear at home)  Hypothyroidism Patient has not taken thyroid replacement therapy for the past 2 weeks.  TSH 14.7, free T4 normal. -Resume home Synthroid -Repeat thyroid function studies in 6 weeks  COPD -Continue home inhalers plus PRN duoneb -x ray shows atelectasis-- added incentive spirometry  Paroxysmal atrial fibrillation - Continue home  Eliquis and metoprolol.  History of PE -Continue home Eliquis  Hyperlipidemia -Continue home pravastatin  Rheumatoid arthritis -Continue home leflunomide and chronic low dose prednisone -PRN voltaren gel  Osteoarthritis -Followed by pain management (note under care everywhere).  Continue MS Contin 15 mg every morning and 30 mg nightly.  Morbid obesity Body mass index is 42.8 kg/m.  Echo done in 2019 at [facility]-- will request copy as not in Epic   Family Communication/Anticipated D/C date and plan/Code Status   DVT prophylaxis: eliquis Code Status: Full Code.  Family Communication:  Disposition Plan: pending psych placement once medically ready-- suspect will be Monday   Medical Consultants:    psych     Subjective:   Not sleeping well  Objective:    Vitals:   05/15/19 0401 05/15/19 0504 05/15/19 0739 05/15/19 0859  BP: 126/68   (!) 142/64  Pulse: 81   82  Resp: 20     Temp: 98.5 F (36.9 C)     TempSrc: Oral     SpO2: 95%  93%   Weight:  116.7 kg    Height:        Intake/Output Summary (Last 24 hours) at 05/15/2019 1314 Last data filed at 05/15/2019 0948 Gross per 24 hour  Intake 598 ml  Output 1300 ml  Net -702 ml   Filed Weights   05/13/19 1544 05/14/19 0557 05/15/19 0504  Weight: 112.9 kg 117.7 kg 116.7 kg    Exam: In bed, sitter at bedside No wheezing this AM, breathing appears more comfortable rrr +LE edema but less than prior   Data Reviewed:   I have personally reviewed following labs and  imaging studies:  Labs: Labs show the following:   Basic Metabolic Panel: Recent Labs  Lab 05/12/19 0923  05/12/19 2138 05/13/19 0433 05/13/19 1400 05/14/19 0616 05/15/19 0551  NA  --   --  128* 127* 127* 129* 131*  K 2.5*   < > 2.7* 3.0* 4.0 3.6 3.5  CL  --   --  89* 90* 90* 91* 94*  CO2  --   --  26 23 24 24 25   GLUCOSE  --   --  130* 143* 182* 131* 120*  BUN  --   --  9 9 9 8 9   CREATININE  --   --  1.10* 1.07* 1.15*  1.11* 1.06*  CALCIUM  --   --  8.3* 8.1* 8.2* 8.5* 8.7*  MG 1.7  --   --  2.0  --  2.0  --    < > = values in this interval not displayed.   GFR Estimated Creatinine Clearance: 53.2 mL/min (A) (by C-G formula based on SCr of 1.06 mg/dL (H)). Liver Function Tests: No results for input(s): AST, ALT, ALKPHOS, BILITOT, PROT, ALBUMIN in the last 168 hours. No results for input(s): LIPASE, AMYLASE in the last 168 hours. No results for input(s): AMMONIA in the last 168 hours. Coagulation profile No results for input(s): INR, PROTIME in the last 168 hours.  CBC: Recent Labs  Lab 05/11/19 1358 05/13/19 0433 05/14/19 0616 05/15/19 0551  WBC 12.5* 10.8* 9.4 8.6  HGB 14.4 12.8 13.3 12.5  HCT 43.1 38.4 40.9 37.6  MCV 87.8 88.9 88.5 86.8  PLT 254 249 226 238   Cardiac Enzymes: No results for input(s): CKTOTAL, CKMB, CKMBINDEX, TROPONINI in the last 168 hours. BNP (last 3 results) No results for input(s): PROBNP in the last 8760 hours. CBG: No results for input(s): GLUCAP in the last 168 hours. D-Dimer: No results for input(s): DDIMER in the last 72 hours. Hgb A1c: No results for input(s): HGBA1C in the last 72 hours. Lipid Profile: No results for input(s): CHOL, HDL, LDLCALC, TRIG, CHOLHDL, LDLDIRECT in the last 72 hours. Thyroid function studies: No results for input(s): TSH, T4TOTAL, T3FREE, THYROIDAB in the last 72 hours.  Invalid input(s): FREET3 Anemia work up: No results for input(s): VITAMINB12, FOLATE, FERRITIN, TIBC, IRON, RETICCTPCT in the last 72 hours. Sepsis Labs: Recent Labs  Lab 05/11/19 1358 05/13/19 0433 05/14/19 0616 05/15/19 0551  WBC 12.5* 10.8* 9.4 8.6    Microbiology Recent Results (from the past 240 hour(s))  SARS Coronavirus 2 [facility] order, Performed in [facility] hospital lab) Nasopharyngeal Nasopharyngeal Swab     Status: None   Collection Time: 05/12/19  5:40 AM   Specimen: Nasopharyngeal Swab  Result Value Ref Range Status   SARS  Coronavirus 2 NEGATIVE NEGATIVE Final    Comment: (NOTE) If result is NEGATIVE SARS-CoV-2 target nucleic acids are NOT DETECTED. The SARS-CoV-2 RNA is generally detectable in upper and lower  respiratory specimens during the acute phase of infection. The lowest  concentration of SARS-CoV-2 viral copies this assay can detect is 250  copies / mL. A negative result does not preclude SARS-CoV-2 infection  and should not be used as the sole basis for treatment or other  patient management decisions.  A negative result may occur with  improper specimen collection / handling, submission of specimen other  than nasopharyngeal swab, presence of viral mutation(s) within the  areas targeted by this assay, and inadequate number of viral copies  (<250 copies / mL).  A negative result must be combined with clinical  observations, patient history, and epidemiological information. If result is POSITIVE SARS-CoV-2 target nucleic acids are DETECTED. The SARS-CoV-2 RNA is generally detectable in upper and lower  respiratory specimens dur ing the acute phase of infection.  Positive  results are indicative of active infection with SARS-CoV-2.  Clinical  correlation with patient history and other diagnostic information is  necessary to determine patient infection status.  Positive results do  not rule out bacterial infection or co-infection with other viruses. If result is PRESUMPTIVE POSTIVE SARS-CoV-2 nucleic acids MAY BE PRESENT.   A presumptive positive result was obtained on the submitted specimen  and confirmed on repeat testing.  While 2019 novel coronavirus  (SARS-CoV-2) nucleic acids may be present in the submitted sample  additional confirmatory testing may be necessary for epidemiological  and / or clinical management purposes  to differentiate between  SARS-CoV-2 and other Sarbecovirus currently known to infect humans.  If clinically indicated additional testing with an alternate test    methodology [phone]([id number]) is advised. The SARS-CoV-2 RNA is generally  detectable in upper and lower respiratory sp ecimens during the acute  phase of infection. The expected result is Negative. Fact Sheet for Patients:  BoilerBrush.com.cyhttps://www.fda.gov/media/136312/download Fact Sheet for Healthcare Providers: https://pope.com/https://www.fda.gov/media/136313/download This test is not yet approved or cleared by the [place] FDA and has been authorized for detection and/or diagnosis of SARS-CoV-2 by FDA under an Emergency Use Authorization (EUA).  This EUA will remain in effect (meaning this test can be used) for the duration of the COVID-19 declaration under Section 564(b)(1) of the Act, 21 U.S.C. section 360bbb-3(b)(1), unless the authorization is terminated or revoked sooner. Performed at [facility], [address] [place], [place] [address]   Urine Culture     Status: Abnormal   Collection Time: 05/12/19 11:00 AM   Specimen: Urine, Random  Result Value Ref Range Status   Specimen Description URINE, RANDOM  Final   Special Requests   Final    NONE Performed at [facility], [address] [place], [place] [address]    Culture >=100,000 COLONIES/mL ESCHERICHIA COLI (A)  Final   Report Status 05/15/2019 FINAL  Final   Organism ID, Bacteria ESCHERICHIA COLI (A)  Final      Susceptibility   Escherichia coli - MIC*    AMPICILLIN <=2 SENSITIVE Sensitive     CEFAZOLIN <=4 SENSITIVE Sensitive     CEFTRIAXONE <=1 SENSITIVE Sensitive     CIPROFLOXACIN <=0.25 SENSITIVE Sensitive     GENTAMICIN <=1 SENSITIVE Sensitive     IMIPENEM <=0.25 SENSITIVE Sensitive     NITROFURANTOIN <=16 SENSITIVE Sensitive     TRIMETH/SULFA <=20 SENSITIVE Sensitive     AMPICILLIN/SULBACTAM <=2 SENSITIVE Sensitive     PIP/TAZO <=4 SENSITIVE Sensitive     Extended ESBL NEGATIVE Sensitive     * >=100,000 COLONIES/mL ESCHERICHIA COLI    Procedures and diagnostic studies:  Dg Chest Port 1 View  Result Date:  05/15/2019 CLINICAL DATA:  Wheezing EXAM: PORTABLE CHEST 1 VIEW COMPARISON:  05/11/2019 FINDINGS: Cardiac shadow is stable. Aortic calcifications are again seen. Mild basilar atelectasis is noted. No bony abnormality is noted. IMPRESSION: Bibasilar atelectatic changes. Electronically Signed   By: Alcide CleverMark  Lukens M.D.   On: 05/15/2019 08:00    Medications:    apixaban  5 mg Oral BID   aspirin EC  81 mg Oral Daily   cephALEXin  500 mg Oral Q12H   cholecalciferol  2,000 Units Oral Daily   diclofenac sodium  2 g Topical QID   docusate sodium  100 mg Oral BID   fluticasone furoate-vilanterol  1 puff Inhalation Daily   folic acid  1 mg Oral Daily   furosemide  40 mg Intravenous Daily   guaiFENesin  600 mg Oral BID   leflunomide  10 mg Oral Daily   levothyroxine  150 mcg Oral QAC breakfast   metoprolol succinate  50 mg Oral BID   morphine  15 mg Oral q morning - 10a   morphine  30 mg Oral QHS   potassium chloride  40 mEq Oral BID   pravastatin  40 mg Oral QHS   predniSONE  5 mg Oral BID WC   umeclidinium bromide  1 puff Inhalation Daily   Continuous Infusions:  sodium chloride 250 mL (05/15/19 0514)     LOS: 2 days   Geradine Girt  [facility]   How to contact the [facility] Attending or Consulting provider [place] or covering provider during after hours [place], for this patient?  1. Check the care team in [facility] and look for a) attending/consulting TRH provider listed and b) the [facility] team listed 2. Log into [email or web address] and use [place]'s universal password to access. If you do not have the password, please contact the hospital operator. 3. Locate the [facility] provider you are looking for under [facility] and page to a number that you can be directly reached. 4. If you still have difficulty reaching the provider, please page the [facility] (Director on Call) for the Hospitalists listed on amion for assistance.  05/15/2019, 1:14 PM

## 2019-05-15 NOTE — Care Management Important Message (Signed)
Important Message  Patient Details  Name: Kristina May MRN: [id number] Date of Birth: 11-Jan-1936   Medicare Important Message Given:  Yes     [name] 05/15/2019, 11:11 AM

## 2019-05-15 NOTE — Plan of Care (Signed)
Patient is coping well, she is educated about pain management.

## 2019-05-15 NOTE — Progress Notes (Signed)
Patient's daughter is called with patient's permission for updates.

## 2019-05-16 ENCOUNTER — Encounter (HOSPITAL_COMMUNITY): Payer: Self-pay | Admitting: Surgery

## 2019-05-16 LAB — CBC
HCT: 39.3 % (ref 36.0–46.0)
Hemoglobin: 12.8 g/dL (ref 12.0–15.0)
MCH: 28.9 pg (ref 26.0–34.0)
MCHC: 32.6 g/dL (ref 30.0–36.0)
MCV: 88.7 fL (ref 80.0–100.0)
Platelets: 265 10*3/uL (ref 150–400)
RBC: 4.43 MIL/uL (ref 3.87–5.11)
RDW: 14.2 % (ref 11.5–15.5)
WBC: 7.1 10*3/uL (ref 4.0–10.5)
nRBC: 0 % (ref 0.0–0.2)

## 2019-05-16 LAB — BASIC METABOLIC PANEL
Anion gap: 10 (ref 5–15)
BUN: 11 mg/dL (ref 8–23)
CO2: 28 mmol/L (ref 22–32)
Calcium: 8.5 mg/dL — ABNORMAL LOW (ref 8.9–10.3)
Chloride: 93 mmol/L — ABNORMAL LOW (ref 98–111)
Creatinine, Ser: 1.14 mg/dL — ABNORMAL HIGH (ref 0.44–1.00)
GFR calc Af Amer: 52 mL/min — ABNORMAL LOW (ref >60)
GFR calc non Af Amer: 45 mL/min — ABNORMAL LOW (ref >60)
Glucose, Bld: 129 mg/dL — ABNORMAL HIGH (ref 70–99)
Potassium: 3.6 mmol/L (ref 3.5–5.1)
Sodium: 131 mmol/L — ABNORMAL LOW (ref 135–145)

## 2019-05-16 MED ORDER — FUROSEMIDE 20 MG PO TABS
20.0000 mg | ORAL_TABLET | Freq: Two times a day (BID) | ORAL | Status: DC
  Administered 2019-05-16: 20 mg via ORAL
  Filled 2019-05-16 (×2): qty 1

## 2019-05-16 MED ORDER — RISAQUAD PO CAPS
1.0000 | ORAL_CAPSULE | Freq: Every day | ORAL | Status: DC
  Administered 2019-05-16 – 2019-05-26 (×11): 1 via ORAL
  Filled 2019-05-16 (×11): qty 1

## 2019-05-16 NOTE — Progress Notes (Signed)
Patient did not want her morphine after I popped it out of the package, morphine is wasted in stericycle with Shirlee Limerick, RN witness.

## 2019-05-16 NOTE — Progress Notes (Signed)
Progress Note    Kristina May  XTG:[id number] DOB: 1935/12/26  DOA: 05/11/2019 PCP: Tomasa Hose, NP    Brief Narrative:     Medical records reviewed and are as summarized below:  Kristina May is an 83 y.o. female with medical history significant of COPD, hypertension, hyperlipidemia, rheumatoid arthritis, hypothyroidism, paroxysmal atrial fibrillation and PE on Eliquis presented to the ED via EMS per family request on May 11, 2019 with a 2-week history of refusal to take medications. Patient told ED provider that she stopped taking medications because she did not see a point in living anymore and wanted to die.  Assessment/Plan:   Principal Problem:   MDD (major depressive disorder), recurrent severe, without psychosis ([place]) Active Problems:   Hypokalemia   QT prolongation   UTI (urinary tract infection)   Suicidal ideation   Venous stasis  hypokalemia, QT prolongation on EKG -repleated - Keep potassium above 4 and magnesium above 2.  Volume overload -responded well to IV lasix -change to PO lasix as per home dose and monitor  UTI UA with negative leukocyte esterase and 0-5 WBCs, however, nitrite positive.  Does have mild leukocytosis. -Ceftriaxone- change to PO abx for total of 5 days -Urine culture: ecoli  Suicidal ideation Psychiatry evaluated the patient and recommended inpatient geriatric psych facility.  Pending medical clearance. -Suicide precautions -Sitter at bedside  Hyponatremia -responded well to IV lasix  Hypothyroidism Patient has not taken thyroid replacement therapy for the past 2 weeks.  TSH 14.7, free T4 normal. -Resume home Synthroid -Repeat thyroid function studies in 6 weeks  COPD -Continue home inhalers plus PRN duoneb -x ray shows atelectasis-- added incentive spirometry  Paroxysmal atrial fibrillation - Continue home Eliquis and metoprolol.  History of PE -Continue home Eliquis  Hyperlipidemia -Continue  home pravastatin  Rheumatoid arthritis -Continue home leflunomide and chronic low dose prednisone -PRN voltaren gel  Osteoarthritis -Followed by pain management (note under care everywhere).  patient had quit taking her morphine at home already and no longer wants to take going forward  Morbid obesity Body mass index is 42.67 kg/m.  Atelectasis -incentive spirometry  E[facility] done in 2019 at [facility]-- will request copy as not in Epic   Family Communication/Anticipated D/C date and plan/Code Status   DVT prophylaxis: eliquis Code Status: Full Code.  Family Communication:  Disposition Plan: patient is now medically ready to go to inpatient psych  Medical Consultants:    psych     Subjective:   Multiple complaints  Objective:    Vitals:   05/16/19 0210 05/16/19 0507 05/16/19 0617 05/16/19 0848  BP:  130/70  (!) 109/95  Pulse:  78  84  Resp:      Temp:  97.9 F (36.6 C)  98.1 F (36.7 C)  TempSrc:  Oral  Oral  SpO2: 92% 97%  96%  Weight:   116.3 kg   Height:        Intake/Output Summary (Last 24 hours) at 05/16/2019 1409 Last data filed at 05/16/2019 0850 Gross per 24 hour  Intake 340 ml  Output 1100 ml  Net -760 ml   Filed Weights   05/14/19 0557 05/15/19 0504 05/16/19 0617  Weight: 117.7 kg 116.7 kg 116.3 kg    Exam: In bed, NAD Less LE edema rrr No increased work of breathing-- 100% on 2L   Data Reviewed:   I have personally reviewed following labs and imaging studies:  Labs: Labs show the following:   Basic Metabolic  Panel: Recent Labs  Lab 05/12/19 0923  05/13/19 0433 05/13/19 1400 05/14/19 0616 05/15/19 0551 05/16/19 0543  NA  --    < > 127* 127* 129* 131* 131*  K 2.5*   < > 3.0* 4.0 3.6 3.5 3.6  CL  --    < > 90* 90* 91* 94* 93*  CO2  --    < > 23 24 24 25 28   GLUCOSE  --    < > 143* 182* 131* 120* 129*  BUN  --    < > 9 9 8 9 11   CREATININE  --    < > 1.07* 1.15* 1.11* 1.06* 1.14*  CALCIUM  --    < > 8.1* 8.2* 8.5*  8.7* 8.5*  MG 1.7  --  2.0  --  2.0  --   --    < > = values in this interval not displayed.   GFR Estimated Creatinine Clearance: 49.3 mL/min (A) (by C-G formula based on SCr of 1.14 mg/dL (H)). Liver Function Tests: No results for input(s): AST, ALT, ALKPHOS, BILITOT, PROT, ALBUMIN in the last 168 hours. No results for input(s): LIPASE, AMYLASE in the last 168 hours. No results for input(s): AMMONIA in the last 168 hours. Coagulation profile No results for input(s): INR, PROTIME in the last 168 hours.  CBC: Recent Labs  Lab 05/11/19 1358 05/13/19 0433 05/14/19 0616 05/15/19 0551 05/16/19 0543  WBC 12.5* 10.8* 9.4 8.6 7.1  HGB 14.4 12.8 13.3 12.5 12.8  HCT 43.1 38.4 40.9 37.6 39.3  MCV 87.8 88.9 88.5 86.8 88.7  PLT 254 249 226 238 265   Cardiac Enzymes: No results for input(s): CKTOTAL, CKMB, CKMBINDEX, TROPONINI in the last 168 hours. BNP (last 3 results) No results for input(s): PROBNP in the last 8760 hours. CBG: No results for input(s): GLUCAP in the last 168 hours. D-Dimer: No results for input(s): DDIMER in the last 72 hours. Hgb A1c: No results for input(s): HGBA1C in the last 72 hours. Lipid Profile: No results for input(s): CHOL, HDL, LDLCALC, TRIG, CHOLHDL, LDLDIRECT in the last 72 hours. Thyroid function studies: No results for input(s): TSH, T4TOTAL, T3FREE, THYROIDAB in the last 72 hours.  Invalid input(s): FREET3 Anemia work up: No results for input(s): VITAMINB12, FOLATE, FERRITIN, TIBC, IRON, RETICCTPCT in the last 72 hours. Sepsis Labs: Recent Labs  Lab 05/13/19 0433 05/14/19 0616 05/15/19 0551 05/16/19 0543  WBC 10.8* 9.4 8.6 7.1    Microbiology Recent Results (from the past 240 hour(s))  SARS Coronavirus 2 [facility](Hospital order, [facility]) Nasopharyngeal Nasopharyngeal Swab     Status: None   Collection Time: 05/12/19  5:40 AM   Specimen: Nasopharyngeal Swab  Result Value Ref Range Status   SARS Coronavirus 2  NEGATIVE NEGATIVE Final    Comment: (NOTE) If result is NEGATIVE SARS-CoV-2 target nucleic acids are NOT DETECTED. The SARS-CoV-2 RNA is generally detectable in upper and lower  respiratory specimens during the acute phase of infection. The lowest  concentration of SARS-CoV-2 viral copies this assay can detect is 250  copies / mL. A negative result does not preclude SARS-CoV-2 infection  and should not be used as the sole basis for treatment or other  patient management decisions.  A negative result may occur with  improper specimen collection / handling, submission of specimen other  than nasopharyngeal swab, presence of viral mutation(s) within the  areas targeted by this assay, and inadequate number of viral copies  (<250 copies /  mL). A negative result must be combined with clinical  observations, patient history, and epidemiological information. If result is POSITIVE SARS-CoV-2 target nucleic acids are DETECTED. The SARS-CoV-2 RNA is generally detectable in upper and lower  respiratory specimens dur ing the acute phase of infection.  Positive  results are indicative of active infection with SARS-CoV-2.  Clinical  correlation with patient history and other diagnostic information is  necessary to determine patient infection status.  Positive results do  not rule out bacterial infection or co-infection with other viruses. If result is PRESUMPTIVE POSTIVE SARS-CoV-2 nucleic acids MAY BE PRESENT.   A presumptive positive result was obtained on the submitted specimen  and confirmed on repeat testing.  While 2019 novel coronavirus  (SARS-CoV-2) nucleic acids may be present in the submitted sample  additional confirmatory testing may be necessary for epidemiological  and / or clinical management purposes  to differentiate between  SARS-CoV-2 and other Sarbecovirus currently known to infect humans.  If clinically indicated additional testing with an alternate test  methodology [phone])  is advised. The SARS-CoV-2 RNA is generally  detectable in upper and lower respiratory sp ecimens during the acute  phase of infection. The expected result is Negative. Fact Sheet for Patients:  BoilerBrush.com.cy Fact Sheet for Healthcare Providers: https://pope.com/ This test is not yet approved or cleared by the [place] FDA and has been authorized for detection and/or diagnosis of SARS-CoV-2 by FDA under an Emergency Use Authorization (EUA).  This EUA will remain in effect (meaning this test can be used) for the duration of the COVID-19 declaration under Section 564(b)(1) of the Act, 21 U.S.C. section 360bbb-3(b)(1), unless the authorization is terminated or revoked sooner. Performed at [facility], [address] [place], [place] [address]   Urine Culture     Status: Abnormal   Collection Time: 05/12/19 11:00 AM   Specimen: Urine, Random  Result Value Ref Range Status   Specimen Description URINE, RANDOM  Final   Special Requests   Final    NONE Performed at [facility], [address] [place], [place] [address]    Culture >=100,000 COLONIES/mL ESCHERICHIA COLI (A)  Final   Report Status 05/15/2019 FINAL  Final   Organism ID, Bacteria ESCHERICHIA COLI (A)  Final      Susceptibility   Escherichia coli - MIC*    AMPICILLIN <=2 SENSITIVE Sensitive     CEFAZOLIN <=4 SENSITIVE Sensitive     CEFTRIAXONE <=1 SENSITIVE Sensitive     CIPROFLOXACIN <=0.25 SENSITIVE Sensitive     GENTAMICIN <=1 SENSITIVE Sensitive     IMIPENEM <=0.25 SENSITIVE Sensitive     NITROFURANTOIN <=16 SENSITIVE Sensitive     TRIMETH/SULFA <=20 SENSITIVE Sensitive     AMPICILLIN/SULBACTAM <=2 SENSITIVE Sensitive     PIP/TAZO <=4 SENSITIVE Sensitive     Extended ESBL NEGATIVE Sensitive     * >=100,000 COLONIES/mL ESCHERICHIA COLI    Procedures and diagnostic studies:  Dg Chest Port 1 View  Result Date: 05/15/2019 CLINICAL DATA:   Wheezing EXAM: PORTABLE CHEST 1 VIEW COMPARISON:  05/11/2019 FINDINGS: Cardiac shadow is stable. Aortic calcifications are again seen. Mild basilar atelectasis is noted. No bony abnormality is noted. IMPRESSION: Bibasilar atelectatic changes. Electronically Signed   By: Alcide Clever M.D.   On: 05/15/2019 08:00    Medications:   . acidophilus  1 capsule Oral Daily  . apixaban  5 mg Oral BID  . aspirin EC  81 mg Oral Daily  . cephALEXin  500 mg Oral Q12H  . cholecalciferol  2,000 Units Oral Daily  . diclofenac sodium  2 g Topical QID  . docusate sodium  100 mg Oral BID  . fluticasone furoate-vilanterol  1 puff Inhalation Daily  . folic acid  1 mg Oral Daily  . furosemide  20 mg Oral BID  . guaiFENesin  600 mg Oral BID  . leflunomide  10 mg Oral Daily  . levothyroxine  150 mcg Oral QAC breakfast  . metoprolol succinate  50 mg Oral BID  . potassium chloride  40 mEq Oral BID  . pravastatin  40 mg Oral QHS  . predniSONE  5 mg Oral BID WC  . umeclidinium bromide  1 puff Inhalation Daily   Continuous Infusions: . sodium chloride 250 mL (05/15/19 0514)     LOS: 3 days   Joseph Art  [facility]   How to contact the [facility] Attending or Consulting provider 7A - 7P or covering provider during after hours 7P -7A, for this patient?  1. Check the care team in [facility] and look for a) attending/consulting TRH provider listed and b) the [facility] team listed 2. Log into [email or web address] and use [place]'s universal password to access. If you do not have the password, please contact the hospital operator. 3. Locate the [facility] provider you are looking for under [facility] and page to a number that you can be directly reached. 4. If you still have difficulty reaching the provider, please page the [facility] (Director on Call) for the Hospitalists listed on amion for assistance.  05/16/2019, 2:09 PM

## 2019-05-16 NOTE — Discharge Instructions (Signed)

## 2019-05-16 NOTE — Plan of Care (Signed)
  Problem: Health Behavior/Discharge (Transition) Planning: Goal: Ability to manage health-related needs will improve Outcome: Progressing   Problem: Medication Management: Goal: Adhere to prescribed medication regimen Outcome: Progressing   Problem: Self Esteem: Goal: Ability to verbalize positive feeling about self will improve Outcome: Progressing

## 2019-05-16 NOTE — Progress Notes (Signed)
Patient's daughter is called and was given updates with patient's permission.

## 2019-05-16 NOTE — Plan of Care (Signed)
Patient is very cooperative today, she did not want to take her morphine, MD is aware.

## 2019-05-17 DIAGNOSIS — R45851 Suicidal ideations: Secondary | ICD-10-CM

## 2019-05-17 LAB — BASIC METABOLIC PANEL
Anion gap: 10 (ref 5–15)
BUN: 10 mg/dL (ref 8–23)
CO2: 27 mmol/L (ref 22–32)
Calcium: 8.8 mg/dL — ABNORMAL LOW (ref 8.9–10.3)
Chloride: 96 mmol/L — ABNORMAL LOW (ref 98–111)
Creatinine, Ser: 1.07 mg/dL — ABNORMAL HIGH (ref 0.44–1.00)
GFR calc Af Amer: 56 mL/min — ABNORMAL LOW (ref >60)
GFR calc non Af Amer: 49 mL/min — ABNORMAL LOW (ref >60)
Glucose, Bld: 148 mg/dL — ABNORMAL HIGH (ref 70–99)
Potassium: 3.7 mmol/L (ref 3.5–5.1)
Sodium: 133 mmol/L — ABNORMAL LOW (ref 135–145)

## 2019-05-17 LAB — MAGNESIUM: Magnesium: 1.8 mg/dL (ref 1.7–2.4)

## 2019-05-17 MED ORDER — FUROSEMIDE 40 MG PO TABS
40.0000 mg | ORAL_TABLET | Freq: Two times a day (BID) | ORAL | Status: DC
  Administered 2019-05-17 – 2019-05-26 (×18): 40 mg via ORAL
  Filled 2019-05-17 (×18): qty 1

## 2019-05-17 MED ORDER — LOPERAMIDE HCL 2 MG PO CAPS
2.0000 mg | ORAL_CAPSULE | Freq: Once | ORAL | Status: AC
  Administered 2019-05-17: 2 mg via ORAL
  Filled 2019-05-17: qty 1

## 2019-05-17 MED ORDER — LIP MEDEX EX OINT
TOPICAL_OINTMENT | CUTANEOUS | Status: DC | PRN
  Administered 2019-05-17: 02:00:00 via TOPICAL
  Filled 2019-05-17: qty 7

## 2019-05-17 MED ORDER — MAGNESIUM CHLORIDE 64 MG PO TBEC
1.0000 | DELAYED_RELEASE_TABLET | Freq: Every day | ORAL | Status: DC
  Administered 2019-05-17 – 2019-05-26 (×10): 64 mg via ORAL
  Filled 2019-05-17 (×11): qty 1

## 2019-05-17 MED ORDER — TRAZODONE HCL 50 MG PO TABS
50.0000 mg | ORAL_TABLET | Freq: Every day | ORAL | Status: DC
  Administered 2019-05-17 – 2019-05-25 (×8): 50 mg via ORAL
  Filled 2019-05-17 (×9): qty 1

## 2019-05-17 MED ORDER — IBUPROFEN 200 MG PO TABS
400.0000 mg | ORAL_TABLET | Freq: Once | ORAL | Status: AC
  Administered 2019-05-17: 13:00:00 400 mg via ORAL
  Filled 2019-05-17: qty 2

## 2019-05-17 NOTE — Progress Notes (Signed)
Progress Note    Kristina BertholdGisela May  ZOX:[id number] DOB: [birth year]  DOA: 05/11/2019 PCP: Jettie PaganFalstreau, Brooke A, NP    Brief Narrative:     Medical records reviewed and are as summarized below:  Kristina BertholdGisela May is an 83 y.o. female with medical history significant of COPD, hypertension, hyperlipidemia, rheumatoid arthritis, hypothyroidism, paroxysmal atrial fibrillation and PE on Eliquis presented to the ED via EMS per family request on May 11, 2019 with a 2-week history of refusal to take medications. Patient told ED provider that she stopped taking medications because she did not see a point in living anymore and wanted to die.  Assessment/Plan:   Principal Problem:   MDD (major depressive disorder), recurrent severe, without psychosis ([facility]) Active Problems:   Hypokalemia   QT prolongation   UTI (urinary tract infection)   Suicidal ideation   Venous stasis  hypokalemia, QT prolongation on EKG -repleated - Keep potassium above 4 and magnesium above 2.  Volume overload -responded well to IV lasix -change to PO lasix but higher than home dose as she does not follow a low salt diet  UTI UA with negative leukocyte esterase and 0-5 WBCs, however, nitrite positive.  Does have mild leukocytosis. -Ceftriaxone- change to PO abx for total of 5 days -Urine culture: ecoli  Suicidal ideation Psychiatry evaluated the patient and recommended inpatient geriatric psych facility.  Pending medical clearance. -Suicide precautions -Sitter at bedside  Hyponatremia -responded well to IV lasix  Hypothyroidism Patient has not taken thyroid replacement therapy for the past 2 weeks.  TSH 14.7, free T4 normal. -Resume home Synthroid -Repeat thyroid function studies in 6 weeks  COPD -Continue home inhalers plus PRN duoneb -x ray shows atelectasis-- added incentive spirometry  Paroxysmal atrial fibrillation - Continue home Eliquis and metoprolol.  History of PE -Continue home  Eliquis  Hyperlipidemia -Continue home pravastatin  Rheumatoid arthritis -Continue home leflunomide and chronic low dose prednisone -PRN voltaren gel  Osteoarthritis -Followed by pain management (note under care everywhere).  patient had quit taking her morphine at home already and no longer wants to take going forward  Morbid obesity Body mass index is 42.53 kg/m.  Atelectasis -incentive spirometry  [facility]-- will request copy as not in Epic   Family Communication/Anticipated D/C date and plan/Code Status   DVT prophylaxis: eliquis Code Status: Full Code.  Family Communication:  Disposition Plan: patient is now medically ready to go to inpatient psych  Medical Consultants:    psych     Subjective:   Not sleeping well, does not like the low salt diet-- does not follow low salt at home   Objective:    Vitals:   05/16/19 2050 05/17/19 0511 05/17/19 0720 05/17/19 0915  BP: (!) 144/82 139/74  133/65  Pulse: 74 71  80  Resp: 18 18  17   Temp: 97.8 F (36.6 C) 97.6 F (36.4 C)  98.3 F (36.8 C)  TempSrc: Oral Oral  Oral  SpO2: 97% 97% 94% 96%  Weight:  115.9 kg    Height:        Intake/Output Summary (Last 24 hours) at 05/17/2019 1148 Last data filed at 05/17/2019 1027 Gross per 24 hour  Intake 1020 ml  Output 1450 ml  Net -430 ml   Filed Weights   05/15/19 0504 05/16/19 0617 05/17/19 0511  Weight: 116.7 kg 116.3 kg 115.9 kg    Exam: In bed, NAD rrr Expiratory wheezes +LE edema but improved from prior  Data Reviewed:   I have personally reviewed following labs and imaging studies:  Labs: Labs show the following:   Basic Metabolic Panel: Recent Labs  Lab 05/12/19 0923  05/13/19 0433 05/13/19 1400 05/14/19 0616 05/15/19 0551 05/16/19 0543 05/17/19 0509  NA  --    < > 127* 127* 129* 131* 131* 133*  K 2.5*   < > 3.0* 4.0 3.6 3.5 3.6 3.7  CL  --    < > 90* 90* 91* 94* 93* 96*  CO2  --    < > 23 24 24 25  28 27   GLUCOSE  --    < > 143* 182* 131* 120* 129* 148*  BUN  --    < > 9 9 8 9 11 10   CREATININE  --    < > 1.07* 1.15* 1.11* 1.06* 1.14* 1.07*  CALCIUM  --    < > 8.1* 8.2* 8.5* 8.7* 8.5* 8.8*  MG 1.7  --  2.0  --  2.0  --   --  1.8   < > = values in this interval not displayed.   GFR Estimated Creatinine Clearance: 52.5 mL/min (A) (by C-G formula based on SCr of 1.07 mg/dL (H)). Liver Function Tests: No results for input(s): AST, ALT, ALKPHOS, BILITOT, PROT, ALBUMIN in the last 168 hours. No results for input(s): LIPASE, AMYLASE in the last 168 hours. No results for input(s): AMMONIA in the last 168 hours. Coagulation profile No results for input(s): INR, PROTIME in the last 168 hours.  CBC: Recent Labs  Lab 05/11/19 1358 05/13/19 0433 05/14/19 0616 05/15/19 0551 05/16/19 0543  WBC 12.5* 10.8* 9.4 8.6 7.1  HGB 14.4 12.8 13.3 12.5 12.8  HCT 43.1 38.4 40.9 37.6 39.3  MCV 87.8 88.9 88.5 86.8 88.7  PLT 254 249 226 238 265   Cardiac Enzymes: No results for input(s): CKTOTAL, CKMB, CKMBINDEX, TROPONINI in the last 168 hours. BNP (last 3 results) No results for input(s): PROBNP in the last 8760 hours. CBG: No results for input(s): GLUCAP in the last 168 hours. D-Dimer: No results for input(s): DDIMER in the last 72 hours. Hgb A1c: No results for input(s): HGBA1C in the last 72 hours. Lipid Profile: No results for input(s): CHOL, HDL, LDLCALC, TRIG, CHOLHDL, LDLDIRECT in the last 72 hours. Thyroid function studies: No results for input(s): TSH, T4TOTAL, T3FREE, THYROIDAB in the last 72 hours.  Invalid input(s): FREET3 Anemia work up: No results for input(s): VITAMINB12, FOLATE, FERRITIN, TIBC, IRON, RETICCTPCT in the last 72 hours. Sepsis Labs: Recent Labs  Lab 05/13/19 0433 05/14/19 0616 05/15/19 0551 05/16/19 0543  WBC 10.8* 9.4 8.6 7.1    Microbiology Recent Results (from the past 240 hour(s))  SARS Coronavirus 2 [facility] order, Performed in [facility]  hospital lab) Nasopharyngeal Nasopharyngeal Swab     Status: None   Collection Time: 05/12/19  5:40 AM   Specimen: Nasopharyngeal Swab  Result Value Ref Range Status   SARS Coronavirus 2 NEGATIVE NEGATIVE Final    Comment: (NOTE) If result is NEGATIVE SARS-CoV-2 target nucleic acids are NOT DETECTED. The SARS-CoV-2 RNA is generally detectable in upper and lower  respiratory specimens during the acute phase of infection. The lowest  concentration of SARS-CoV-2 viral copies this assay can detect is 250  copies / mL. A negative result does not preclude SARS-CoV-2 infection  and should not be used as the sole basis for treatment or other  patient management decisions.  A negative result may occur with  improper specimen collection / handling, submission of specimen other  than nasopharyngeal swab, presence of viral mutation(s) within the  areas targeted by this assay, and inadequate number of viral copies  (<250 copies / mL). A negative result must be combined with clinical  observations, patient history, and epidemiological information. If result is POSITIVE SARS-CoV-2 target nucleic acids are DETECTED. The SARS-CoV-2 RNA is generally detectable in upper and lower  respiratory specimens dur ing the acute phase of infection.  Positive  results are indicative of active infection with SARS-CoV-2.  Clinical  correlation with patient history and other diagnostic information is  necessary to determine patient infection status.  Positive results do  not rule out bacterial infection or co-infection with other viruses. If result is PRESUMPTIVE POSTIVE SARS-CoV-2 nucleic acids MAY BE PRESENT.   A presumptive positive result was obtained on the submitted specimen  and confirmed on repeat testing.  While 2019 novel coronavirus  (SARS-CoV-2) nucleic acids may be present in the submitted sample  additional confirmatory testing may be necessary for epidemiological  and / or clinical management  purposes  to differentiate between  SARS-CoV-2 and other Sarbecovirus currently known to infect humans.  If clinically indicated additional testing with an alternate test  methodology [phone]) is advised. The SARS-CoV-2 RNA is generally  detectable in upper and lower respiratory sp ecimens during the acute  phase of infection. The expected result is Negative. Fact Sheet for Patients:  StrictlyIdeas.no Fact Sheet for Healthcare Providers: BankingDealers.co.za This test is not yet approved or cleared by the [place] FDA and has been authorized for detection and/or diagnosis of SARS-CoV-2 by FDA under an Emergency Use Authorization (EUA).  This EUA will remain in effect (meaning this test can be used) for the duration of the COVID-19 declaration under Section 564(b)(1) of the Act, 21 U.S.C. section 360bbb-3(b)(1), unless the authorization is terminated or revoked sooner. Performed at [facility], [place] [address] [place], [place] [address]   Urine Culture     Status: Abnormal   Collection Time: 05/12/19 11:00 AM   Specimen: Urine, Random  Result Value Ref Range Status   Specimen Description URINE, RANDOM  Final   Special Requests   Final    NONE Performed at [facility], [place] [address], [place], [place] [address]    Culture >=100,000 COLONIES/mL ESCHERICHIA COLI (A)  Final   Report Status 05/15/2019 FINAL  Final   Organism ID, Bacteria ESCHERICHIA COLI (A)  Final      Susceptibility   Escherichia coli - MIC*    AMPICILLIN <=2 SENSITIVE Sensitive     CEFAZOLIN <=4 SENSITIVE Sensitive     CEFTRIAXONE <=1 SENSITIVE Sensitive     CIPROFLOXACIN <=0.25 SENSITIVE Sensitive     GENTAMICIN <=1 SENSITIVE Sensitive     IMIPENEM <=0.25 SENSITIVE Sensitive     NITROFURANTOIN <=16 SENSITIVE Sensitive     TRIMETH/SULFA <=20 SENSITIVE Sensitive     AMPICILLIN/SULBACTAM <=2 SENSITIVE Sensitive     PIP/TAZO <=4 SENSITIVE  Sensitive     Extended ESBL NEGATIVE Sensitive     * >=100,000 COLONIES/mL ESCHERICHIA COLI    Procedures and diagnostic studies:  No results found.  Medications:   . acidophilus  1 capsule Oral Daily  . apixaban  5 mg Oral BID  . aspirin EC  81 mg Oral Daily  . cephALEXin  500 mg Oral Q12H  . cholecalciferol  2,000 Units Oral Daily  . diclofenac sodium  2 g Topical QID  .  docusate sodium  100 mg Oral BID  . fluticasone furoate-vilanterol  1 puff Inhalation Daily  . folic acid  1 mg Oral Daily  . furosemide  40 mg Oral BID  . guaiFENesin  600 mg Oral BID  . leflunomide  10 mg Oral Daily  . levothyroxine  150 mcg Oral QAC breakfast  . magnesium chloride  1 tablet Oral Daily  . metoprolol succinate  50 mg Oral BID  . potassium chloride  40 mEq Oral BID  . pravastatin  40 mg Oral QHS  . predniSONE  5 mg Oral BID WC  . umeclidinium bromide  1 puff Inhalation Daily   Continuous Infusions: . sodium chloride 250 mL (05/15/19 0514)     LOS: 4 days   Joseph Art  [facility]   How to contact the [facility] Attending or Consulting provider 7A - 7P or covering provider during after hours 7P -7A, for this patient?  1. Check the care team in [facility] and look for a) attending/consulting TRH provider listed and b) the [facility] team listed 2. Log into [email or web address] and use [place]'s universal password to access. If you do not have the password, please contact the hospital operator. 3. Locate the [facility] provider you are looking for under [facility] and page to a number that you can be directly reached. 4. If you still have difficulty reaching the provider, please page the [facility] (Director on Call) for the Hospitalists listed on amion for assistance.  05/17/2019, 11:48 AM

## 2019-05-18 LAB — BASIC METABOLIC PANEL
Anion gap: 11 (ref 5–15)
BUN: 11 mg/dL (ref 8–23)
CO2: 26 mmol/L (ref 22–32)
Calcium: 9.1 mg/dL (ref 8.9–10.3)
Chloride: 97 mmol/L — ABNORMAL LOW (ref 98–111)
Creatinine, Ser: 1.13 mg/dL — ABNORMAL HIGH (ref 0.44–1.00)
GFR calc Af Amer: 53 mL/min — ABNORMAL LOW (ref >60)
GFR calc non Af Amer: 46 mL/min — ABNORMAL LOW (ref >60)
Glucose, Bld: 183 mg/dL — ABNORMAL HIGH (ref 70–99)
Potassium: 4.1 mmol/L (ref 3.5–5.1)
Sodium: 134 mmol/L — ABNORMAL LOW (ref 135–145)

## 2019-05-18 LAB — CBC
HCT: 40.3 % (ref 36.0–46.0)
Hemoglobin: 12.8 g/dL (ref 12.0–15.0)
MCH: 28.5 pg (ref 26.0–34.0)
MCHC: 31.8 g/dL (ref 30.0–36.0)
MCV: 89.8 fL (ref 80.0–100.0)
Platelets: 276 10*3/uL (ref 150–400)
RBC: 4.49 MIL/uL (ref 3.87–5.11)
RDW: 14.2 % (ref 11.5–15.5)
WBC: 7.7 10*3/uL (ref 4.0–10.5)
nRBC: 0 % (ref 0.0–0.2)

## 2019-05-18 NOTE — Progress Notes (Signed)
Progress Note    Kristina BertholdGisela May  WUJ:[id number] DOB: September 09, 1936  DOA: 05/11/2019 PCP: Jettie PaganFalstreau, Brooke A, NP    Brief Narrative:     Medical records reviewed and are as summarized below:  Kristina May is an 83 y.o. female with medical history significant of COPD, hypertension, hyperlipidemia, rheumatoid arthritis, hypothyroidism, paroxysmal atrial fibrillation and PE on Eliquis presented to the ED via EMS per family request on May 11, 2019 with a 2-week history of refusal to take medications. Patient told ED provider that she stopped taking medications because she did not see a point in living anymore and wanted to die.    Assessment/Plan:   Principal Problem:   MDD (major depressive disorder), recurrent severe, without psychosis (HCC) Active Problems:   Hypokalemia   QT prolongation   UTI (urinary tract infection)   Suicidal ideation   Venous stasis  hypokalemia, QT prolongation on EKG -repleated - Keep potassium above 4 and magnesium above 2.  Volume overload -responded well to IV lasix -change to PO lasix but higher than home dose as she does not follow a low salt diet  UTI UA with negative leukocyte esterase and 0-5 WBCs, however, nitrite positive.  Does have mild leukocytosis. -Ceftriaxone- change to PO abx for total of 5 days -Urine culture: ecoli  Suicidal ideation Psychiatry evaluated the patient and recommended inpatient geriatric psych facility.  Pending medical clearance. -Suicide precautions -Sitter at bedside  Hyponatremia -responded well to IV lasix  Hypothyroidism Patient has not taken thyroid replacement therapy for the past 2 weeks.  TSH 14.7, free T4 normal. -Resume home Synthroid -Repeat thyroid function studies in 6 weeks  COPD -Continue home inhalers plus PRN duoneb -x ray shows atelectasis-- added incentive spirometry  Paroxysmal atrial fibrillation - Continue home Eliquis and metoprolol.  History of PE -Continue home  Eliquis  Hyperlipidemia -Continue home pravastatin  Rheumatoid arthritis -Continue home leflunomide and chronic low dose prednisone -PRN voltaren gel  Osteoarthritis -Followed by pain management (note under care everywhere).  patient had quit taking her morphine at home already and no longer wants to take going forward  Morbid obesity Body mass index is 42.37 kg/m.  Atelectasis -incentive spirometry  [facility]-- will request copy as not in Epic   Family Communication/Anticipated D/C date and plan/Code Status   DVT prophylaxis: eliquis Code Status: Full Code.  Family Communication:  Disposition Plan: patient is now medically ready to go to inpatient psych  Medical Consultants:    psych     Subjective:   Slept "a whole hour last night"  Objective:    Vitals:   05/17/19 2252 05/18/19 0455 05/18/19 0509 05/18/19 0905  BP: 137/77 (!) 162/74  120/81  Pulse: 69 68  74  Resp:  16    Temp:  97.7 F (36.5 C)    TempSrc:  Oral    SpO2:  98%    Weight:   115.5 kg   Height:        Intake/Output Summary (Last 24 hours) at 05/18/2019 1155 Last data filed at 05/18/2019 0900 Gross per 24 hour  Intake 957 ml  Output 800 ml  Net 157 ml   Filed Weights   05/16/19 0617 05/17/19 0511 05/18/19 0509  Weight: 116.3 kg 115.9 kg 115.5 kg    Exam: In bed, pleasant Able to carry on conversation w/o dyspnea Forced expiratory wheezing +LE edema   Data Reviewed:   I have personally reviewed following labs and imaging  studies:  Labs: Labs show the following:   Basic Metabolic Panel: Recent Labs  Lab 05/12/19 0923  05/13/19 0433  05/14/19 0616 05/15/19 0551 05/16/19 0543 05/17/19 0509 05/18/19 0936  NA  --    < > 127*   < > 129* 131* 131* 133* 134*  K 2.5*   < > 3.0*   < > 3.6 3.5 3.6 3.7 4.1  CL  --    < > 90*   < > 91* 94* 93* 96* 97*  CO2  --    < > 23   < > 24 25 28 27 26   GLUCOSE  --    < > 143*   < > 131* 120* 129* 148*  183*  BUN  --    < > 9   < > 8 9 11 10 11   CREATININE  --    < > 1.07*   < > 1.11* 1.06* 1.14* 1.07* 1.13*  CALCIUM  --    < > 8.1*   < > 8.5* 8.7* 8.5* 8.8* 9.1  MG 1.7  --  2.0  --  2.0  --   --  1.8  --    < > = values in this interval not displayed.   GFR Estimated Creatinine Clearance: 49.6 mL/min (A) (by C-G formula based on SCr of 1.13 mg/dL (H)). Liver Function Tests: No results for input(s): AST, ALT, ALKPHOS, BILITOT, PROT, ALBUMIN in the last 168 hours. No results for input(s): LIPASE, AMYLASE in the last 168 hours. No results for input(s): AMMONIA in the last 168 hours. Coagulation profile No results for input(s): INR, PROTIME in the last 168 hours.  CBC: Recent Labs  Lab 05/13/19 0433 05/14/19 0616 05/15/19 0551 05/16/19 0543 05/18/19 0936  WBC 10.8* 9.4 8.6 7.1 7.7  HGB 12.8 13.3 12.5 12.8 12.8  HCT 38.4 40.9 37.6 39.3 40.3  MCV 88.9 88.5 86.8 88.7 89.8  PLT 249 226 238 265 276   Cardiac Enzymes: No results for input(s): CKTOTAL, CKMB, CKMBINDEX, TROPONINI in the last 168 hours. BNP (last 3 results) No results for input(s): PROBNP in the last 8760 hours. CBG: No results for input(s): GLUCAP in the last 168 hours. D-Dimer: No results for input(s): DDIMER in the last 72 hours. Hgb A1c: No results for input(s): HGBA1C in the last 72 hours. Lipid Profile: No results for input(s): CHOL, HDL, LDLCALC, TRIG, CHOLHDL, LDLDIRECT in the last 72 hours. Thyroid function studies: No results for input(s): TSH, T4TOTAL, T3FREE, THYROIDAB in the last 72 hours.  Invalid input(s): FREET3 Anemia work up: No results for input(s): VITAMINB12, FOLATE, FERRITIN, TIBC, IRON, RETICCTPCT in the last 72 hours. Sepsis Labs: Recent Labs  Lab 05/14/19 0616 05/15/19 0551 05/16/19 0543 05/18/19 0936  WBC 9.4 8.6 7.1 7.7    Microbiology Recent Results (from the past 240 hour(s))  SARS Coronavirus 2 [facility] order, Performed in [facility] hospital lab) Nasopharyngeal  Nasopharyngeal Swab     Status: None   Collection Time: 05/12/19  5:40 AM   Specimen: Nasopharyngeal Swab  Result Value Ref Range Status   SARS Coronavirus 2 NEGATIVE NEGATIVE Final    Comment: (NOTE) If result is NEGATIVE SARS-CoV-2 target nucleic acids are NOT DETECTED. The SARS-CoV-2 RNA is generally detectable in upper and lower  respiratory specimens during the acute phase of infection. The lowest  concentration of SARS-CoV-2 viral copies this assay can detect is 250  copies / mL. A negative result does not preclude SARS-CoV-2 infection  and  should not be used as the sole basis for treatment or other  patient management decisions.  A negative result may occur with  improper specimen collection / handling, submission of specimen other  than nasopharyngeal swab, presence of viral mutation(s) within the  areas targeted by this assay, and inadequate number of viral copies  (<250 copies / mL). A negative result must be combined with clinical  observations, patient history, and epidemiological information. If result is POSITIVE SARS-CoV-2 target nucleic acids are DETECTED. The SARS-CoV-2 RNA is generally detectable in upper and lower  respiratory specimens dur ing the acute phase of infection.  Positive  results are indicative of active infection with SARS-CoV-2.  Clinical  correlation with patient history and other diagnostic information is  necessary to determine patient infection status.  Positive results do  not rule out bacterial infection or co-infection with other viruses. If result is PRESUMPTIVE POSTIVE SARS-CoV-2 nucleic acids MAY BE PRESENT.   A presumptive positive result was obtained on the submitted specimen  and confirmed on repeat testing.  While 2019 novel coronavirus  (SARS-CoV-2) nucleic acids may be present in the submitted sample  additional confirmatory testing may be necessary for epidemiological  and / or clinical management purposes  to differentiate between   SARS-CoV-2 and other Sarbecovirus currently known to infect humans.  If clinically indicated additional testing with an alternate test  methodology [phone]) is advised. The SARS-CoV-2 RNA is generally  detectable in upper and lower respiratory sp ecimens during the acute  phase of infection. The expected result is Negative. Fact Sheet for Patients:  BoilerBrush.com.cy Fact Sheet for Healthcare Providers: https://pope.com/ This test is not yet approved or cleared by the [place] FDA and has been authorized for detection and/or diagnosis of SARS-CoV-2 by FDA under an Emergency Use Authorization (EUA).  This EUA will remain in effect (meaning this test can be used) for the duration of the COVID-19 declaration under Section 564(b)(1) of the Act, 21 U.S.C. section 360bbb-3(b)(1), unless the authorization is terminated or revoked sooner. Performed at [facility], [address], [place], [place] [address]   Urine Culture     Status: Abnormal   Collection Time: 05/12/19 11:00 AM   Specimen: Urine, Random  Result Value Ref Range Status   Specimen Description URINE, RANDOM  Final   Special Requests   Final    NONE Performed at [facility], [address], [place], [place] [address]    Culture >=100,000 COLONIES/mL ESCHERICHIA COLI (A)  Final   Report Status 05/15/2019 FINAL  Final   Organism ID, Bacteria ESCHERICHIA COLI (A)  Final      Susceptibility   Escherichia coli - MIC*    AMPICILLIN <=2 SENSITIVE Sensitive     CEFAZOLIN <=4 SENSITIVE Sensitive     CEFTRIAXONE <=1 SENSITIVE Sensitive     CIPROFLOXACIN <=0.25 SENSITIVE Sensitive     GENTAMICIN <=1 SENSITIVE Sensitive     IMIPENEM <=0.25 SENSITIVE Sensitive     NITROFURANTOIN <=16 SENSITIVE Sensitive     TRIMETH/SULFA <=20 SENSITIVE Sensitive     AMPICILLIN/SULBACTAM <=2 SENSITIVE Sensitive     PIP/TAZO <=4 SENSITIVE Sensitive     Extended ESBL NEGATIVE  Sensitive     * >=100,000 COLONIES/mL ESCHERICHIA COLI    Procedures and diagnostic studies:  No results found.  Medications:   . acidophilus  1 capsule Oral Daily  . apixaban  5 mg Oral BID  . aspirin EC  81 mg Oral Daily  .  cholecalciferol  2,000 Units Oral Daily  . diclofenac sodium  2 g Topical QID  . fluticasone furoate-vilanterol  1 puff Inhalation Daily  . folic acid  1 mg Oral Daily  . furosemide  40 mg Oral BID  . guaiFENesin  600 mg Oral BID  . leflunomide  10 mg Oral Daily  . levothyroxine  150 mcg Oral QAC breakfast  . magnesium chloride  1 tablet Oral Daily  . metoprolol succinate  50 mg Oral BID  . potassium chloride  40 mEq Oral BID  . pravastatin  40 mg Oral QHS  . predniSONE  5 mg Oral BID WC  . traZODone  50 mg Oral QHS  . umeclidinium bromide  1 puff Inhalation Daily   Continuous Infusions: . sodium chloride 250 mL (05/15/19 0514)     LOS: 5 days   Joseph ArtJessica U Schylar Wuebker  [facility]   How to contact the [facility] Attending or Consulting provider 7A - 7P or covering provider during after hours 7P -7A, for this patient?  1. Check the care team in [facility] and look for a) attending/consulting [facility] provider listed and b) the [facility] team listed 2. Log into [email or web address] and use [place]'s universal password to access. If you do not have the password, please contact the hospital operator. 3. Locate the [facility] provider you are looking for under [facility] and page to a number that you can be directly reached. 4. If you still have difficulty reaching the provider, please page the [facility] (Director on Call) for the Hospitalists listed on amion for assistance.  05/18/2019, 11:55 AM

## 2019-05-18 NOTE — Plan of Care (Signed)
  Problem: Education: Goal: Knowledge of warning signs, risks, and behaviors that relate to suicide ideation and self-harm behaviors will improve Outcome: Progressing   Problem: Health Behavior/Discharge (Transition) Planning: Goal: Ability to manage health-related needs will improve Outcome: Progressing   Problem: Clinical Measurements: Goal: Remain free from any harm during hospitalization Outcome: Progressing   Problem: Nutrition: Goal: Adequate fluids and nutrition will be maintained Outcome: Progressing   Problem: Coping: Goal: Ability to disclose and discuss thoughts of suicide and self-harm will improve Outcome: Progressing   Problem: Medication Management: Goal: Adhere to prescribed medication regimen Outcome: Progressing   Problem: Sleep Hygiene: Goal: Ability to obtain adequate restful sleep will improve Outcome: Progressing   Problem: Self Esteem: Goal: Ability to verbalize positive feeling about self will improve Outcome: Progressing   Problem: Education: Goal: Knowledge of General Education information will improve Description: Including pain rating scale, medication(s)/side effects and non-pharmacologic comfort measures Outcome: Progressing   Problem: Health Behavior/Discharge Planning: Goal: Ability to manage health-related needs will improve Outcome: Progressing   Problem: Clinical Measurements: Goal: Ability to maintain clinical measurements within normal limits will improve Outcome: Progressing Goal: Will remain free from infection Outcome: Progressing Goal: Diagnostic test results will improve Outcome: Progressing Goal: Respiratory complications will improve Outcome: Progressing Goal: Cardiovascular complication will be avoided Outcome: Progressing   Problem: Activity: Goal: Risk for activity intolerance will decrease Outcome: Progressing   Problem: Nutrition: Goal: Adequate nutrition will be maintained Outcome: Progressing   Problem:  Coping: Goal: Level of anxiety will decrease Outcome: Progressing   Problem: Elimination: Goal: Will not experience complications related to bowel motility Outcome: Progressing Goal: Will not experience complications related to urinary retention Outcome: Progressing   Problem: Pain Managment: Goal: General experience of comfort will improve Outcome: Progressing   Problem: Safety: Goal: Ability to remain free from injury will improve Outcome: Progressing   Problem: Skin Integrity: Goal: Risk for impaired skin integrity will decrease Outcome: Progressing   Problem: Education: Goal: Knowledge of disease or condition will improve Outcome: Progressing Goal: Knowledge of the prescribed therapeutic regimen will improve Outcome: Progressing Goal: Individualized Educational Video(s) Outcome: Progressing   Problem: Activity: Goal: Ability to tolerate increased activity will improve Outcome: Progressing Goal: Will verbalize the importance of balancing activity with adequate rest periods Outcome: Progressing   Problem: Respiratory: Goal: Ability to maintain a clear airway will improve Outcome: Progressing Goal: Levels of oxygenation will improve Outcome: Progressing Goal: Ability to maintain adequate ventilation will improve Outcome: Progressing

## 2019-05-18 NOTE — Care Management Important Message (Signed)
Important Message  Patient Details  Name: Kristina May MRN: [id number] Date of Birth: August 24, 1936   Medicare Important Message Given:  Yes     [name] 05/18/2019, 1:07 PM

## 2019-05-18 NOTE — Progress Notes (Signed)
Patient had a mews score of two because vitals were obtained right when the patient came back from the bathroom and was trying to catch her breath. Patient was okay and vitals were rechecked a few minutes after. Patient is in bed and sitter is in room.

## 2019-05-19 MED ORDER — FLUCONAZOLE 150 MG PO TABS: 150.0000 mg | ORAL_TABLET | Freq: Once | ORAL | Status: DC

## 2019-05-19 MED ORDER — ZINC OXIDE 11.3 % EX CREA
1.0000 "application " | TOPICAL_CREAM | Freq: Two times a day (BID) | CUTANEOUS | Status: DC
  Administered 2019-05-19 – 2019-05-25 (×8): 1 via TOPICAL
  Filled 2019-05-19: qty 56

## 2019-05-19 MED ORDER — NYSTATIN 100000 UNIT/GM EX POWD
Freq: Three times a day (TID) | CUTANEOUS | Status: DC
  Administered 2019-05-20 – 2019-05-25 (×8): via TOPICAL
  Filled 2019-05-19 (×2): qty 15

## 2019-05-19 NOTE — Progress Notes (Addendum)
CSW assisting with coverage for this case, CSW notes referral sent to [facility] on 8/13 by previous CSW. Patient not medically stable at that time.   Appears patient medically stable to dc to inpatient psych as of Saturday 8/15.   CSW called [facility] [phone]) today in which they requested additional faxed clinicals to [phone]. CSW has sent requested  documentation, they report they will review additional documents and notify CSW if able to accept.   [place], [place]

## 2019-05-19 NOTE — Progress Notes (Signed)
Progress Note    Kristina May  OVF:[id number] DOB: 1935/10/25  DOA: 05/11/2019 PCP: Tomasa Hose, NP    Brief Narrative:     Medical records reviewed and are as summarized below:  Kristina May is an 83 y.o. female with medical history significant of COPD, hypertension, hyperlipidemia, rheumatoid arthritis, hypothyroidism, paroxysmal atrial fibrillation and PE on Eliquis presented to the ED via EMS per family request on May 11, 2019 with a 2-week history of refusal to take medications. Patient told ED provider that she stopped taking medications because she did not see a point in living anymore and wanted to die.    Assessment/Plan:   Principal Problem:   MDD (major depressive disorder), recurrent severe, without psychosis ([place]) Active Problems:   Hypokalemia   QT prolongation   UTI (urinary tract infection)   Suicidal ideation   Venous stasis  hypokalemia, QT prolongation on EKG -repleated - Keep potassium above 4 and magnesium above 2.  Volume overload -responded well to IV lasix -change to PO lasix but higher than home dose as she does not follow a low salt diet  UTI UA with negative leukocyte esterase and 0-5 WBCs, however, nitrite positive.  Does have mild leukocytosis. -Ceftriaxone- changed to PO abx for total of 5 days -Urine culture: ecoli -treatment finished  Suicidal ideation Psychiatry evaluated the patient and recommended inpatient geriatric psych facility.  Pending medical clearance. -Suicide precautions -Sitter at bedside  Hyponatremia -responded well to diuresis  Hypothyroidism Patient has not taken thyroid replacement therapy for the past 2 weeks.  TSH 14.7, free T4 normal. -Resume home Synthroid -Repeat thyroid function studies in 6 weeks  COPD (occasional expiratory wheezing) -Continue home inhalers plus PRN duoneb (does not appear to be getting the PRN) -x ray shows atelectasis-- added incentive spirometry  Paroxysmal  atrial fibrillation - Continue home Eliquis and metoprolol.  History of PE -Continue home Eliquis  Hyperlipidemia -Continue home pravastatin  Rheumatoid arthritis -Continue home leflunomide and chronic low dose prednisone -PRN voltaren gel  Osteoarthritis -Followed by pain management (note under care everywhere).  patient had quit taking her morphine at home already and no longer wants to take going forward  Morbid obesity Body mass index is 44.45 kg/m.  Atelectasis -incentive spirometry  Echo done in 2019 at [facility]-- will request copy as not in Epic   Family Communication/Anticipated D/C date and plan/Code Status   DVT prophylaxis: eliquis Code Status: Full Code.  Family Communication:  Disposition Plan: patient is now medically ready to go to inpatient psych- await bed  Medical Consultants:    psych     Subjective:   Would like coffee but was told she could not have this  Objective:    Vitals:   05/18/19 2115 05/19/19 0705 05/19/19 0734 05/19/19 0845  BP: 130/74 130/68  (!) 142/74  Pulse:  (!) 56  60  Resp: (!) 21 18    Temp:  (!) 97.5 F (36.4 C)    TempSrc:  Oral    SpO2:  98% 97%   Weight:  121.2 kg    Height:        Intake/Output Summary (Last 24 hours) at 05/19/2019 1057 Last data filed at 05/19/2019 8416 Gross per 24 hour  Intake 1200 ml  Output 1000 ml  Net 200 ml   Filed Weights   05/17/19 0511 05/18/19 0509 05/19/19 0705  Weight: 115.9 kg 115.5 kg 121.2 kg    Exam: In bed, NAD +LE edema but continues  to decrease rrr Expiratory wheezing  Data Reviewed:   I have personally reviewed following labs and imaging studies:  Labs: Labs show the following:   Basic Metabolic Panel: Recent Labs  Lab 05/13/19 0433  05/14/19 0616 05/15/19 0551 05/16/19 0543 05/17/19 0509 05/18/19 0936  NA 127*   < > 129* 131* 131* 133* 134*  K 3.0*   < > 3.6 3.5 3.6 3.7 4.1  CL 90*   < > 91* 94* 93* 96* 97*  CO2 23   < > 24 25 28  27 26   GLUCOSE 143*   < > 131* 120* 129* 148* 183*  BUN 9   < > 8 9 11 10 11   CREATININE 1.07*   < > 1.11* 1.06* 1.14* 1.07* 1.13*  CALCIUM 8.1*   < > 8.5* 8.7* 8.5* 8.8* 9.1  MG 2.0  --  2.0  --   --  1.8  --    < > = values in this interval not displayed.   GFR Estimated Creatinine Clearance: 51 mL/min (A) (by C-G formula based on SCr of 1.13 mg/dL (H)). Liver Function Tests: No results for input(s): AST, ALT, ALKPHOS, BILITOT, PROT, ALBUMIN in the last 168 hours. No results for input(s): LIPASE, AMYLASE in the last 168 hours. No results for input(s): AMMONIA in the last 168 hours. Coagulation profile No results for input(s): INR, PROTIME in the last 168 hours.  CBC: Recent Labs  Lab 05/13/19 0433 05/14/19 0616 05/15/19 0551 05/16/19 0543 05/18/19 0936  WBC 10.8* 9.4 8.6 7.1 7.7  HGB 12.8 13.3 12.5 12.8 12.8  HCT 38.4 40.9 37.6 39.3 40.3  MCV 88.9 88.5 86.8 88.7 89.8  PLT 249 226 238 265 276   Cardiac Enzymes: No results for input(s): CKTOTAL, CKMB, CKMBINDEX, TROPONINI in the last 168 hours. BNP (last 3 results) No results for input(s): PROBNP in the last 8760 hours. CBG: No results for input(s): GLUCAP in the last 168 hours. D-Dimer: No results for input(s): DDIMER in the last 72 hours. Hgb A1c: No results for input(s): HGBA1C in the last 72 hours. Lipid Profile: No results for input(s): CHOL, HDL, LDLCALC, TRIG, CHOLHDL, LDLDIRECT in the last 72 hours. Thyroid function studies: No results for input(s): TSH, T4TOTAL, T3FREE, THYROIDAB in the last 72 hours.  Invalid input(s): FREET3 Anemia work up: No results for input(s): VITAMINB12, FOLATE, FERRITIN, TIBC, IRON, RETICCTPCT in the last 72 hours. Sepsis Labs: Recent Labs  Lab 05/14/19 0616 05/15/19 0551 05/16/19 0543 05/18/19 0936  WBC 9.4 8.6 7.1 7.7    Microbiology Recent Results (from the past 240 hour(s))  SARS Coronavirus 2 [facility] order, Performed in [facility] hospital lab) Nasopharyngeal  Nasopharyngeal Swab     Status: None   Collection Time: 05/12/19  5:40 AM   Specimen: Nasopharyngeal Swab  Result Value Ref Range Status   SARS Coronavirus 2 NEGATIVE NEGATIVE Final    Comment: (NOTE) If result is NEGATIVE SARS-CoV-2 target nucleic acids are NOT DETECTED. The SARS-CoV-2 RNA is generally detectable in upper and lower  respiratory specimens during the acute phase of infection. The lowest  concentration of SARS-CoV-2 viral copies this assay can detect is 250  copies / mL. A negative result does not preclude SARS-CoV-2 infection  and should not be used as the sole basis for treatment or other  patient management decisions.  A negative result may occur with  improper specimen collection / handling, submission of specimen other  than nasopharyngeal swab, presence of viral mutation(s) within the  areas targeted by this assay, and inadequate number of viral copies  (<250 copies / mL). A negative result must be combined with clinical  observations, patient history, and epidemiological information. If result is POSITIVE SARS-CoV-2 target nucleic acids are DETECTED. The SARS-CoV-2 RNA is generally detectable in upper and lower  respiratory specimens dur ing the acute phase of infection.  Positive  results are indicative of active infection with SARS-CoV-2.  Clinical  correlation with patient history and other diagnostic information is  necessary to determine patient infection status.  Positive results do  not rule out bacterial infection or co-infection with other viruses. If result is PRESUMPTIVE POSTIVE SARS-CoV-2 nucleic acids MAY BE PRESENT.   A presumptive positive result was obtained on the submitted specimen  and confirmed on repeat testing.  While 2019 novel coronavirus  (SARS-CoV-2) nucleic acids may be present in the submitted sample  additional confirmatory testing may be necessary for epidemiological  and / or clinical management purposes  to differentiate between   SARS-CoV-2 and other Sarbecovirus currently known to infect humans.  If clinically indicated additional testing with an alternate test  methodology [phone]) is advised. The SARS-CoV-2 RNA is generally  detectable in upper and lower respiratory sp ecimens during the acute  phase of infection. The expected result is Negative. Fact Sheet for Patients:  BoilerBrush.com.cy Fact Sheet for Healthcare Providers: https://pope.com/ This test is not yet approved or cleared by the [place] FDA and has been authorized for detection and/or diagnosis of SARS-CoV-2 by FDA under an Emergency Use Authorization (EUA).  This EUA will remain in effect (meaning this test can be used) for the duration of the COVID-19 declaration under Section 564(b)(1) of the Act, 21 U.S.C. section 360bbb-3(b)(1), unless the authorization is terminated or revoked sooner. Performed at [facility], [address] [place], [place] [address]   Urine Culture     Status: Abnormal   Collection Time: 05/12/19 11:00 AM   Specimen: Urine, Random  Result Value Ref Range Status   Specimen Description URINE, RANDOM  Final   Special Requests   Final    NONE Performed at [facility], [address], [place], [place] [address]    Culture >=100,000 COLONIES/mL ESCHERICHIA COLI (A)  Final   Report Status 05/15/2019 FINAL  Final   Organism ID, Bacteria ESCHERICHIA COLI (A)  Final      Susceptibility   Escherichia coli - MIC*    AMPICILLIN <=2 SENSITIVE Sensitive     CEFAZOLIN <=4 SENSITIVE Sensitive     CEFTRIAXONE <=1 SENSITIVE Sensitive     CIPROFLOXACIN <=0.25 SENSITIVE Sensitive     GENTAMICIN <=1 SENSITIVE Sensitive     IMIPENEM <=0.25 SENSITIVE Sensitive     NITROFURANTOIN <=16 SENSITIVE Sensitive     TRIMETH/SULFA <=20 SENSITIVE Sensitive     AMPICILLIN/SULBACTAM <=2 SENSITIVE Sensitive     PIP/TAZO <=4 SENSITIVE Sensitive     Extended ESBL NEGATIVE  Sensitive     * >=100,000 COLONIES/mL ESCHERICHIA COLI    Procedures and diagnostic studies:  No results found.  Medications:   . acidophilus  1 capsule Oral Daily  . apixaban  5 mg Oral BID  . aspirin EC  81 mg Oral Daily  . cholecalciferol  2,000 Units Oral Daily  . diclofenac sodium  2 g Topical QID  . fluticasone furoate-vilanterol  1 puff Inhalation Daily  . folic acid  1 mg Oral Daily  . furosemide  40 mg Oral BID  . guaiFENesin  600 mg Oral BID  . leflunomide  10 mg Oral Daily  . levothyroxine  150 mcg Oral QAC breakfast  . magnesium chloride  1 tablet Oral Daily  . metoprolol succinate  50 mg Oral BID  . potassium chloride  40 mEq Oral BID  . pravastatin  40 mg Oral QHS  . predniSONE  5 mg Oral BID WC  . traZODone  50 mg Oral QHS  . umeclidinium bromide  1 puff Inhalation Daily   Continuous Infusions: . sodium chloride 250 mL (05/15/19 0514)     LOS: 6 days   Joseph ArtJessica U Ailis Rigaud  [facility]   How to contact the [facility] Attending or Consulting provider 7A - 7P or covering provider during after hours 7P -7A, for this patient?  1. Check the care team in [facility] and look for a) attending/consulting [facility] provider listed and b) the [facility] team listed 2. Log into [email or web address] and use [place]'s universal password to access. If you do not have the password, please contact the hospital operator. 3. Locate the [facility] provider you are looking for under [facility] and page to a number that you can be directly reached. 4. If you still have difficulty reaching the provider, please page the [facility] (Director on Call) for the Hospitalists listed on amion for assistance.  05/19/2019, 10:57 AM

## 2019-05-20 NOTE — Progress Notes (Addendum)
CSW followed up with [facility] who reports they did receive patient's referral and currently have her on the wait list for when a bed becomes avail. They report patient will remain on wait list and they will contact CSW when availability occurs.   CSW has confirmed patient not appropriate for [facility] due to her age, this is also a barrier in overall placement as limited geriatric inpatient psych options.   [place], [place]

## 2019-05-20 NOTE — Progress Notes (Signed)
PROGRESS NOTE  Kristina May RWE:[id number] DOB: [birth year] DOA: 05/11/2019 PCP: Tomasa Hose, NP  Brief History   Kristina May is an 83 y.o. female with medical history significant ofCOPD, hypertension, hyperlipidemia, rheumatoid arthritis, hypothyroidism, paroxysmal atrial fibrillation and PE on Eliquispresented to the ED via EMS per family request on May 11, 2019 with a 2-week history of refusal to take medications. Patient told ED provider that she stopped taking medications because she did not see a point in living anymore and wanted to die.    She was admitted to a telemet6ry bed. She has been restarted on her medications. Psychiatry was consulted and the patient has been deemed appropriate for inpatient geri-psych bed. She is on a waiting list. She is not a candidate for [facility] due to her age.   Consultants  . Psychiatry  Procedures  . None  Antibiotics  . None  Interval History/Subjective  The patient is preparing to work with physical therapy as I enter the room. No new complaints.  Objective   Vitals:  Vitals:   05/20/19 1026 05/20/19 1217  BP: (!) 146/60 133/67  Pulse: 63 66  Resp:  (!) 21  Temp:  97.8 F (36.6 C)  SpO2: 94% 94%    Exam:  Constitutional:  . The patient is awake, alert, and oriented x 3. No acute distress. Marland Kitchen  Respiratory:  . No increased work of breathing. . No wheezes, rales, or rhonchi. . No tactile fremitus. Cardiovascular:  . Regular rate and rhythm. . No murmurs, ectopy, or gallups . No lateral PMI. No thrills. Marland Kitchen 1-2 + pitting edema of lower extremitiese. Abdomen:  . Abdomen is soft, non-tender, non-distended. . No hernias, masses, or organomegaly. . Normoactive bowel sounds.  Musculoskeletal:  . No cyanosis or clubbing . 1-2+ pitting edema of lower extremities bilaterally Skin:  . No rashes, lesions, ulcers . palpation of skin: no induration or nodules Neurologic:  . CN 2-12 intact . Sensation all 4 extremities  intact Psychiatric:  . Mental status o Mood, affect appropriate o Orientation to person, place, time  . judgment and insight appear intact     I have personally reviewed the following:   Today's Data  . Vitals  Scheduled Meds: . acidophilus  1 capsule Oral Daily  . apixaban  5 mg Oral BID  . aspirin EC  81 mg Oral Daily  . cholecalciferol  2,000 Units Oral Daily  . diclofenac sodium  2 g Topical QID  . fluticasone furoate-vilanterol  1 puff Inhalation Daily  . folic acid  1 mg Oral Daily  . furosemide  40 mg Oral BID  . guaiFENesin  600 mg Oral BID  . leflunomide  10 mg Oral Daily  . levothyroxine  150 mcg Oral QAC breakfast  . magnesium chloride  1 tablet Oral Daily  . metoprolol succinate  50 mg Oral BID  . nystatin   Topical TID  . potassium chloride  40 mEq Oral BID  . pravastatin  40 mg Oral QHS  . predniSONE  5 mg Oral BID WC  . traZODone  50 mg Oral QHS  . umeclidinium bromide  1 puff Inhalation Daily  . zinc oxide  1 application Topical BID   Continuous Infusions: . sodium chloride 250 mL (05/15/19 0514)    Principal Problem:   MDD (major depressive disorder), recurrent severe, without psychosis ([place]) Active Problems:   Hypokalemia   QT prolongation   UTI (urinary tract infection)   Suicidal ideation   Venous  stasis   LOS: 7 days   A & P  Hypokalemia, QT prolongation on EKG: Potassium replete.. Monitor potassium and magnesium. QTc corrected.  Volume overload: Continue cautious diureses. Monitor creatinine, electrolytes, and volume status.  UTI: UA with negative leukocyte esterase and 0-5 WBCs,however, nitrite positive. Does have mild leukocytosis. The patient was started on ceftriaxone, but she has been converted to oral antibiotics. Urine culture has grown out e.coli.  Suicidal ideation: Psychiatry evaluated the patient and recommended inpatient geriatric psych facility.The patient is medically cleared for discharge to geriatric psychiatric  facility. Suicide precautions/sitter in place.   Hyponatremia: Felt to be due to volume overload. Resolved. Monitor.  Hypothyroidism: Patient has not taken thyroid replacement therapy for the past 2 weeks. TSH 14.7, free T4 normal. She has been restarted on her synthroid. She should have her thyroid functions repeated in 6 weeks.   COPD: Continue home inhalers plus PRN duoneb.Chest x-ray shows atelectasis. She has been placed on incentive spirometry.  Paroxysmal atrial fibrillation: Continue home Eliquis and metoprolol.  History of PE: Continue home Eliquis.  Hyperlipidemia: Continue home pravastatin.  Rheumatoid arthritis: The patient has been continued on her chronic low dose prednisone and leflunomide. She uses voltaren gel as needed.  Osteoarthritis: Followed by pain management (note under care everywhere). Patient had quit taking her morphine at home already and no longer wants to take it going forward.  Morbid obesity: Body mass index is 42.37 kg/m.  Atelectasis: Continue incentive spirometry and mobilize.  I have seen and examined this patient myself. I have spent 32 minutes in her evaluation and care.   Family Communication/Anticipated D/C date and plan/Code Status   DVT prophylaxis: eliquis Code Status: Full Code.  Family Communication:  Disposition Plan: Patient is on waiting list for a geri-psych bed at [facility]. She is medically cleared.  Campbell Agramonte, DO [facility] Direct contact: see [email or web address]  7PM-7AM contact night coverage as above 05/20/2019, 6:22 PM  LOS: 7 days

## 2019-05-21 LAB — BASIC METABOLIC PANEL
Anion gap: 11 (ref 5–15)
BUN: 14 mg/dL (ref 8–23)
CO2: 22 mmol/L (ref 22–32)
Calcium: 8.8 mg/dL — ABNORMAL LOW (ref 8.9–10.3)
Chloride: 102 mmol/L (ref 98–111)
Creatinine, Ser: 1.08 mg/dL — ABNORMAL HIGH (ref 0.44–1.00)
GFR calc Af Amer: 56 mL/min — ABNORMAL LOW (ref >60)
GFR calc non Af Amer: 48 mL/min — ABNORMAL LOW (ref >60)
Glucose, Bld: 128 mg/dL — ABNORMAL HIGH (ref 70–99)
Potassium: 4.2 mmol/L (ref 3.5–5.1)
Sodium: 135 mmol/L (ref 135–145)

## 2019-05-21 NOTE — Progress Notes (Signed)
PROGRESS NOTE  Kristina May:[id number] DOB: 08-Jan-1936 DOA: 05/11/2019 PCP: Tomasa Hose, NP  Brief History   Kristina May is an 83 y.o. female with medical history significant ofCOPD, hypertension, hyperlipidemia, rheumatoid arthritis, hypothyroidism, paroxysmal atrial fibrillation and PE on Eliquispresented to the ED via EMS per family request on May 11, 2019 with a 2-week history of refusal to take medications. Patient told ED provider that she stopped taking medications because she did not see a point in living anymore and wanted to die.    She was admitted to a telemet6ry bed. She has been restarted on her medications. Psychiatry was consulted and the patient has been deemed appropriate for inpatient geri-psych bed. She is on a waiting list. She is not a candidate for [facility] due to her age.   Consultants  . Psychiatry  Procedures  . None  Antibiotics  . None  Interval History/Subjective  The patient is preparing to work with physical therapy as I enter the room. No new complaints.  Objective   Vitals:  Vitals:   05/21/19 1012 05/21/19 1127  BP: 138/70 134/70  Pulse: 65 60  Resp:  17  Temp:  97.7 F (36.5 C)  SpO2:  96%    Exam:  Constitutional:  . The patient is awake, alert, and oriented x 3. No acute distress. Marland Kitchen  Respiratory:  . No increased work of breathing. . No wheezes, rales, or rhonchi. . No tactile fremitus. Cardiovascular:  . Regular rate and rhythm. . No murmurs, ectopy, or gallups . No lateral PMI. No thrills. Marland Kitchen 1-2 + pitting edema of lower extremitiese. Abdomen:  . Abdomen is soft, non-tender, non-distended. . No hernias, masses, or organomegaly. . Normoactive bowel sounds.  Musculoskeletal:  . No cyanosis or clubbing . 1-2+ pitting edema of lower extremities bilaterally Skin:  . No rashes, lesions, ulcers . palpation of skin: no induration or nodules Neurologic:  . CN 2-12 intact . Sensation all 4 extremities intact  Psychiatric:  . Mental status o Mood, affect appropriate o Orientation to person, place, time  . judgment and insight appear intact     I have personally reviewed the following:   Today's Data  . Vitals  Scheduled Meds: . acidophilus  1 capsule Oral Daily  . apixaban  5 mg Oral BID  . aspirin EC  81 mg Oral Daily  . cholecalciferol  2,000 Units Oral Daily  . diclofenac sodium  2 g Topical QID  . fluticasone furoate-vilanterol  1 puff Inhalation Daily  . folic acid  1 mg Oral Daily  . furosemide  40 mg Oral BID  . guaiFENesin  600 mg Oral BID  . leflunomide  10 mg Oral Daily  . levothyroxine  150 mcg Oral QAC breakfast  . magnesium chloride  1 tablet Oral Daily  . metoprolol succinate  50 mg Oral BID  . nystatin   Topical TID  . potassium chloride  40 mEq Oral BID  . pravastatin  40 mg Oral QHS  . predniSONE  5 mg Oral BID WC  . traZODone  50 mg Oral QHS  . umeclidinium bromide  1 puff Inhalation Daily  . zinc oxide  1 application Topical BID   Continuous Infusions: . sodium chloride 250 mL (05/15/19 0514)    Principal Problem:   MDD (major depressive disorder), recurrent severe, without psychosis ([place]) Active Problems:   Hypokalemia   QT prolongation   UTI (urinary tract infection)   Suicidal ideation   Venous stasis  LOS: 8 days   A & P  Hypokalemia, QT prolongation on EKG: Potassium replete.. Monitor potassium and magnesium. QTc corrected.  Volume overload: Continue cautious diureses. Monitor creatinine, electrolytes, and volume status.  UTI: UA with negative leukocyte esterase and 0-5 WBCs,however, nitrite positive. Does have mild leukocytosis. The patient was started on ceftriaxone, but she has been converted to oral antibiotics. Urine culture has grown out e.coli.  Suicidal ideation: Psychiatry evaluated the patient and recommended inpatient geriatric psych facility.The patient is medically cleared for discharge to geriatric psychiatric facility.  Suicide precautions/sitter in place.   Hyponatremia: Felt to be due to volume overload. Resolved. Monitor.  Hypothyroidism: Patient has not taken thyroid replacement therapy for the past 2 weeks. TSH 14.7, free T4 normal. She has been restarted on her synthroid. She should have her thyroid functions repeated in 6 weeks.   COPD: Continue home inhalers plus PRN duoneb.Chest x-ray shows atelectasis. She has been placed on incentive spirometry.  Paroxysmal atrial fibrillation: Continue home Eliquis and metoprolol.  History of PE: Continue home Eliquis.  Hyperlipidemia: Continue home pravastatin.  Rheumatoid arthritis: The patient has been continued on her chronic low dose prednisone and leflunomide. She uses voltaren gel as needed.  Osteoarthritis: Followed by pain management (note under care everywhere). Patient had quit taking her morphine at home already and no longer wants to take it going forward.  Morbid obesity: Body mass index is 42.37 kg/m.  Atelectasis: Continue incentive spirometry and mobilize.  I have seen and examined this patient myself. I have spent 30 minutes in her evaluation and care.   Family Communication/Anticipated D/C date and plan/Code Status   DVT prophylaxis: eliquis Code Status: Full Code.  Family Communication:  Disposition Plan: Patient is on waiting list for a geri[facility]psych [facility] at [facility]. She is medically cleared.  Kristina Greenspan, DO [facility] Direct contact: see [email or web address]  7PM-7AM contact night coverage as above 05/21/2019, 5:53 PM  LOS: 7 days

## 2019-05-21 NOTE — Plan of Care (Signed)
  Problem: Education: Goal: Knowledge of warning signs, risks, and behaviors that relate to suicide ideation and self-harm behaviors will improve Outcome: Progressing   

## 2019-05-22 DIAGNOSIS — F331 Major depressive disorder, recurrent, moderate: Secondary | ICD-10-CM

## 2019-05-22 NOTE — Progress Notes (Addendum)
CSW reached out to [facility] they report having no beds avail. CSW can continue to check back at [phone].  CSW faxed referral to [facility], they report no beds and a "long wait list".   spoke with [facility] in [place] and they report no geri psych beds avail, with no anticipation of any opening soon.   CS[facility]ed [facility] they report they will review chart however do not have geriatric beds avail today.   Patient continues to be on wait list at [facility] for when geriatric psych bed becomes avail.   CSW has requested from MD a psych re-consult to assess if inpatient psych is still appropriate for patient's discharge plan.   Barriers for discharge continue to be lack of geriatric psych beds avail.   [place], [place]

## 2019-05-22 NOTE — Consult Note (Addendum)
Telepsych Consultation   Reason for Consult: "patient was recommended inpatient psych in ER by tele psych but then admitted to hospital" Referring Physician:  Dr. Fran Lowes Location of Patient: [facility] Location of Provider: [facility]  Patient Identification: Kristina May MRN:  [id number] Principal Diagnosis: MDD (major depressive disorder), recurrent episode, moderate (HCC) Diagnosis:  Principal Problem:   MDD (major depressive disorder), recurrent severe, without psychosis (HCC) Active Problems:   Hypokalemia   QT prolongation   UTI (urinary tract infection)   Suicidal ideation   Venous stasis   Total Time spent with patient: 15 minutes  Subjective:   Kristina May is a 83 y.o. female patient admitted with persistent hypokalemia.  HPI:   Per chart review, patient was admitted on 8/10 with persistent hypokalemia and QT prolongation on EKG due to poor medication compliance for 2 weeks. She has been refusing her medications. She stopped her medications because she does not see a point in living anymore and wanted to die. She reports that she is tired of living with chronic pain. She was initially seen by TTS on 8/10 for SI and accepted to [facility] for treatment but it was cancelled due to hypokalemia. She stated to TTS, "My condition is so lousy. 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 I just figured the hell with that!  I been feeling like this for a while.  Last night I threatened to sign myself out and throw myself under a bus."  She was last seen by the psychiatry consult service on 8/12 and recommended for inpatient psychiatric hospitalization.   On interview, Ms. Bonnie reports that she is a "jolly old soul." She denies SI, HI or AVH. She reports problems sleeping in the hospital because she sleeps in a more comfortable reclining chair at home. She denies problems with her appetite.   Past Psychiatric History: History of prior  suicide attempt by overdose but did not seek treatment years ago.   Risk to Self: None. Denies SI.   Risk to Others: Homicidal Ideation: No Thoughts of Harm to Others: No Current Homicidal Intent: No Current Homicidal Plan: No Access to Homicidal Means: No History of harm to others?: No Assessment of Violence: None Noted Does patient have access to weapons?: No Criminal Charges Pending?: No Does patient have a court date: No Prior Inpatient Therapy: Prior Inpatient Therapy: No Prior Outpatient Therapy: Prior Outpatient Therapy: No Does patient have an ACCT team?: No Does patient have Intensive In-House Services?  : No Does patient have [facility] services? : No Does patient have P4CC services?: No  Past Medical History:  Past Medical History:  Diagnosis Date  . COPD (chronic obstructive pulmonary disease) (HCC)   . Dyslipidemia   . Hypertension   . Rheumatoid arthritis(714.0)   . Thyroid disease     Past Surgical History:  Procedure Laterality Date  . FOOT SURGERY     Family History:  Family History  Problem Relation Age of Onset  . Multiple sclerosis Other    Family Psychiatric  History: Denies  Social History:  Social History   Substance and Sexual Activity  Alcohol Use Yes   Comment: Occ     Social History   Substance and Sexual Activity  Drug Use No    Social History   Socioeconomic History  . Marital status: Widowed    Spouse name: Not on file  . Number of children: Not on file  . Years of  education: Not on file  . Highest education level: Not on file  Occupational History  . Not on file  Social Needs  . Financial resource strain: Not on file  . Food insecurity    Worry: Not on file    Inability: Not on file  . Transportation needs    Medical: Not on file    Non-medical: Not on file  Tobacco Use  . Smoking status: Former Games developer  . Smokeless tobacco: Never Used  Substance and Sexual Activity  . Alcohol use: Yes    Comment: Occ  . Drug use:  No  . Sexual activity: Not on file  Lifestyle  . Physical activity    Days per week: Not on file    Minutes per session: Not on file  . Stress: Not on file  Relationships  . Social Musician on phone: Not on file    Gets together: Not on file    Attends religious service: Not on file    Active member of club or organization: Not on file    Attends meetings of clubs or organizations: Not on file    Relationship status: Not on file  Other Topics Concern  . Not on file  Social History Narrative  . Not on file   Additional Social History: She is from [place] and was raised in [place]. She lives with her daughter and son-in-law. She denies alcohol or illicit substance use.     Allergies:   Allergies  Allergen Reactions  . Latex Other (See Comments)    Causes blisters  . Betamethasone Hives  . Dexamethasone Hives  . Tape Other (See Comments)    Caused blisters   . Tylenol [Acetaminophen] Other (See Comments)    Doesn't work for patient   . Remicade [Infliximab] Other (See Comments)    She went to rehab because of it  . Claritin [Loratadine] Other (See Comments)    Doesn't work for patient    Labs:  Results for orders placed or performed during the hospital encounter of 05/11/19 (from the past 48 hour(s))  Basic metabolic panel     Status: Abnormal   Collection Time: 05/21/19  6:59 AM  Result Value Ref Range   Sodium 135 135 - 145 mmol/L   Potassium 4.2 3.5 - 5.1 mmol/L   Chloride 102 98 - 111 mmol/L   CO2 22 22 - 32 mmol/L   Glucose, Bld 128 (H) 70 - 99 mg/dL   BUN 14 8 - 23 mg/dL   Creatinine, Ser 9.56 (H) 0.44 - 1.00 mg/dL   Calcium 8.8 (L) 8.9 - 10.3 mg/dL   GFR calc non Af Amer 48 (L) >60 mL/min   GFR calc Af Amer 56 (L) >60 mL/min   Anion gap 11 5 - 15    Comment: Performed at [facility], [address], [place], [place] [address]    Medications:  Current Facility-Administered Medications  Medication Dose Route Frequency Provider Last  Rate Last Dose  . 0.9 %  sodium chloride infusion   Intravenous PRN Marlin Canary U, DO 10 mL/hr at 05/15/19 0514 250 mL at 05/15/19 0514  . acetaminophen (TYLENOL) tablet 650 mg  650 mg Oral Q6H PRN Marlin Canary U, DO      . acidophilus (RISAQUAD) capsule 1 capsule  1 capsule Oral Daily Vann, Jessica U, DO   1 capsule at 05/22/19 1052  . apixaban (ELIQUIS) tablet 5 mg  5 mg Oral BID  Shela Leff, MD   5 mg at 05/22/19 1052  . aspirin EC tablet 81 mg  81 mg Oral Daily Shela Leff, MD   81 mg at 05/22/19 1052  . cholecalciferol (VITAMIN D3) tablet 2,000 Units  2,000 Units Oral Daily Shela Leff, MD   2,000 Units at 05/22/19 1050  . diclofenac sodium (VOLTAREN) 1 % transdermal gel 2 g  2 g Topical QID Eulogio Bear U, DO   2 g at 05/21/19 2158  . fluticasone furoate-vilanterol (BREO ELLIPTA) 200-25 MCG/INH 1 puff  1 puff Inhalation Daily Shela Leff, MD   1 puff at 05/22/19 0826  . folic acid (FOLVITE) tablet 1 mg  1 mg Oral Daily Shela Leff, MD   1 mg at 05/22/19 1052  . furosemide (LASIX) tablet 40 mg  40 mg Oral BID Eulogio Bear U, DO   40 mg at 05/22/19 1059  . guaiFENesin (MUCINEX) 12 hr tablet 600 mg  600 mg Oral BID Shela Leff, MD   600 mg at 05/22/19 1052  . HYDROcodone-acetaminophen (NORCO/VICODIN) 5-325 MG per tablet 1-2 tablet  1-2 tablet Oral Q6H PRN Schorr, Rhetta Mura, NP   2 tablet at 05/19/19 0431  . ipratropium-albuterol (DUONEB) 0.5-2.5 (3) MG/3ML nebulizer solution 3 mL  3 mL Nebulization Q6H PRN Shela Leff, MD   3 mL at 05/22/19 1053  . leflunomide (ARAVA) tablet 10 mg  10 mg Oral Daily Shela Leff, MD   10 mg at 05/22/19 1053  . levothyroxine (SYNTHROID) tablet 150 mcg  150 mcg Oral QAC breakfast Shela Leff, MD   150 mcg at 05/22/19 [id number]  . lip balm (CARMEX) ointment   Topical PRN Eulogio Bear U, DO      . magnesium chloride (SLOW-MAG) 64 MG SR tablet 64 mg  1 tablet Oral Daily Vann, Jessica U, DO   64 mg at  05/22/19 1050  . metoprolol succinate (TOPROL-XL) 24 hr tablet 50 mg  50 mg Oral BID Shela Leff, MD   50 mg at 05/22/19 1051  . nystatin (MYCOSTATIN/NYSTOP) topical powder   Topical TID Gardiner Barefoot, NP      . potassium chloride SA (K-DUR) CR tablet 40 mEq  40 mEq Oral BID Eulogio Bear U, DO   40 mEq at 05/22/19 1052  . pravastatin (PRAVACHOL) tablet 40 mg  40 mg Oral QHS Shela Leff, MD   40 mg at 05/21/19 2158  . predniSONE (DELTASONE) tablet 5 mg  5 mg Oral BID WC Shela Leff, MD   5 mg at 05/22/19 1115  . traZODone (DESYREL) tablet 50 mg  50 mg Oral QHS Vann, Jessica U, DO   50 mg at 05/21/19 2158  . umeclidinium bromide (INCRUSE ELLIPTA) 62.5 MCG/INH 1 puff  1 puff Inhalation Daily Eulogio Bear U, DO   1 puff at 05/22/19 0826  . zinc oxide (BALMEX) 13.0 % cream 1 application  1 application Topical BID Eulogio Bear U, DO   1 application at 86/57/84 1056    Musculoskeletal: Strength & Muscle Tone: No atrophy noted. Gait & Station: UTA since patient is sitting in bed. Patient leans: N/A  Psychiatric Specialty Exam: Physical Exam  Nursing note and vitals reviewed. Constitutional: She is oriented to person, place, and time. She appears well-developed and well-nourished.  HENT:  Head: Normocephalic and atraumatic.  Neck: Normal range of motion.  Respiratory: Effort normal.  Musculoskeletal: Normal range of motion.  Neurological: She is alert and oriented to person, place, and time.  Psychiatric: She has  a normal mood and affect. Her speech is normal and behavior is normal. Judgment and thought content normal. Cognition and memory are normal.    Review of Systems  Respiratory: Positive for cough.   Gastrointestinal: Negative for abdominal pain, nausea and vomiting.  Psychiatric/Behavioral: Negative for depression, hallucinations, substance abuse and suicidal ideas. The patient has insomnia.   All other systems reviewed and are negative.   Blood  pressure 138/81, pulse 76, temperature 97.6 F (36.4 C), temperature source Oral, resp. rate 17, height 5\' 5"  (1.651 m), weight 113.6 kg, SpO2 97 %.Body mass index is 41.68 kg/m.  General Appearance: Fairly Groomed, elderly, Caucasian female, wearing a hospital gown who is sitting in bed. NAD.   Eye Contact:  Good  Speech:  Clear and Coherent and Normal Rate  Volume:  Normal  Mood:  Euthymic  Affect:  Appropriate and Congruent  Thought Process:  Goal Directed, Linear and Descriptions of Associations: Intact  Orientation:  Full (Time, Place, and Person)  Thought Content:  Logical  Suicidal Thoughts:  No  Homicidal Thoughts:  No  Memory:  Immediate;   Good Recent;   Good Remote;   Good  Judgement:  Fair  Insight:  Fair  Psychomotor Activity:  Normal  Concentration:  Concentration: Good and Attention Span: Good  Recall:  Good  Fund of Knowledge:  Good  Language:  Good  Akathisia:  No  Handed:  Right  AIMS (if indicated):   N/A  Assets:  Communication Skills Housing Resilience Social Support  ADL's:  Impaired  Cognition:  WNL  Sleep:   Fair   Assessment:  Kristina May is a 83 y.o. female who was admitted with persistent hypokalemia and QT prolongation on EKG due to poor medication compliance for 2 weeks. Patient is euthymic in affect today. She spontaneously smiles and laughs during interview. She denies SI, HI or AVH. She does not appear to be responding to internal stimuli. She no longer warrants inpatient psychiatric hospitalization. She should continue to follow up with her outpatient provider at discharge.    Treatment Plan Summary: -Patient no longer warrants inpatient psychiatric hospitalization. -Psychiatry will sign off on patient at this time. Please consult psychiatry again as needed.     Disposition: No evidence of imminent risk to self or others at present.   Patient does not meet criteria for psychiatric inpatient admission.  This service was provided via  telemedicine using a 2-way, interactive audio and video technology.  Names of all persons participating in this telemedicine service and their role in this encounter. Name: Juanetta BeetsJacqueline Norman, DO Role: Psychiatrist  Name: Kristina BertholdGisela Lightner  Role: Patient    Cherly BeachJacqueline J Norman, DO 05/22/2019 1:19 PM

## 2019-05-22 NOTE — Evaluation (Signed)
Physical Therapy Evaluation Patient Details Name: Teola Felipe MRN: [id number] DOB: 07/22/1936 Today's Date: 05/22/2019   History of Present Illness  Pt is an 83 year old female with major depressive disorder presenting with hypokalemia and QT prolongation on EKG following noncompliance with pharmacological interventions. Pt with suicidal ideation initially, but has since been cleared by psychiatry. Relevant PMH includes COPD, HLD, HTN, RA, hypothyroidism, paroxysmal a fib, and PE.  Clinical Impression  Pt presents with the above problem and the below deficits. Performed mobility tasks at the min G level. Pt becomes SOB when walking distances further than 10 ft. O2 sats monitored throughout session and remained above 90%. At discharge recommend SNF level placement to maximize activity tolerance and increase independence with mobility tasks prior to return home. Pt will continue to benefit from skilled acute physical therapy to promote functional independence and safety.    Follow Up Recommendations SNF;Supervision for mobility/OOB    Equipment Recommendations  Other (comment)(tub bench)    Recommendations for Other Services       Precautions / Restrictions Precautions Precautions: Fall Restrictions Weight Bearing Restrictions: No      Mobility  Bed Mobility               General bed mobility comments: pt seated on EOB upon entry to room  Transfers Overall transfer level: Needs assistance Equipment used: None Transfers: Sit to/from Stand Sit to Stand: Min guard         General transfer comment: Pt completes sit to stand transfer with min G assist. Pt completes transfer with increased time.  Ambulation/Gait Ambulation/Gait assistance: Min guard Gait Distance (Feet): 10 Feet Assistive device: None Gait Pattern/deviations: Step-through pattern;Decreased step length - right;Decreased step length - left;Trunk flexed Gait velocity: decreased   General Gait Details:  Pt ambulates with min G assist and uses step through pattern with B decreased step lengh and forward flexed posture. Pt ambulates with increased time required to complete distance. Pt becomes SOB when walking short distances. O2 sat monitored during session and remained between 94 and 95%.  Stairs            Wheelchair Mobility    Modified Rankin (Stroke Patients Only)       Balance Overall balance assessment: Needs assistance Sitting-balance support: Feet supported Sitting balance-Leahy Scale: Good     Standing balance support: No upper extremity supported Standing balance-Leahy Scale: Fair                               Pertinent Vitals/Pain Pain Assessment: No/denies pain    Home Living Family/patient expects to be discharged to:: Private residence Living Arrangements: Children Available Help at Discharge: Family;Available 24 hours/day(Daughter available, but also caregiver for spouse) Type of Home: House Home Access: Stairs to enter Entrance Stairs-Rails: Can reach both Entrance Stairs-Number of Steps: 5 Home Layout: One level Home Equipment: [place] - 4 wheels;Walker - 2 wheels;Cane - single point;Toilet riser;Shower seat      Prior Function Level of Independence: Needs assistance   Gait / Transfers Assistance Needed: Pt ambulates with rollator. Pt reports she is unable to cover long distances as she experiences wheezing and becomes SOB. Pt also reports difficulty with steps.  ADL's / Homemaking Assistance Needed: needs some assistance with dressing and bathing        Hand Dominance        Extremity/Trunk Assessment   Upper Extremity Assessment Upper Extremity Assessment: Overall WFL for tasks  assessed    Lower Extremity Assessment Lower Extremity Assessment: Generalized weakness    Cervical / Trunk Assessment Cervical / Trunk Assessment: Normal  Communication   Communication: HOH  Cognition Arousal/Alertness: Awake/alert Behavior  During Therapy: WFL for tasks assessed/performed Overall Cognitive Status: Within Functional Limits for tasks assessed                                 General Comments: A & O x 4      General Comments      Exercises     Assessment/Plan    PT Assessment Patient needs continued PT services  PT Problem List Decreased strength;Decreased activity tolerance;Decreased balance;Decreased mobility;Decreased safety awareness;Cardiopulmonary status limiting activity       PT Treatment Interventions DME instruction;Gait training;Stair training;Functional mobility training;Therapeutic activities;Therapeutic exercise;Balance training;Patient/family education    PT Goals (Current goals can be found in the Care Plan section)  Acute Rehab PT Goals Patient Stated Goal: to increase strength PT Goal Formulation: With patient Time For Goal Achievement: 06/05/19 Potential to Achieve Goals: Good    Frequency Min 2X/week   Barriers to discharge Decreased caregiver support Pt lives with family, but doesn't want to rely on them at discharge.    Co-evaluation               AM-PAC PT "6 Clicks" Mobility  Outcome Measure Help needed turning from your back to your side while in a flat bed without using bedrails?: A Little Help needed moving from lying on your back to sitting on the side of a flat bed without using bedrails?: A Little Help needed moving to and from a bed to a chair (including a wheelchair)?: A Little Help needed standing up from a chair using your arms (e.g., wheelchair or bedside chair)?: A Little Help needed to walk in hospital room?: A Little Help needed climbing 3-5 steps with a railing? : A Lot 6 Click Score: 17    End of Session Equipment Utilized During Treatment: Gait belt Activity Tolerance: Patient limited by fatigue Patient left: in chair;with call bell/phone within reach;with chair alarm set Nurse Communication: Mobility status PT Visit Diagnosis:  Unsteadiness on feet (R26.81);Other abnormalities of gait and mobility (R26.89);Muscle weakness (generalized) (M62.81)    Time: 6767-2094 PT Time Calculation (min) (ACUTE ONLY): 19 min   Charges:   PT Evaluation $PT Eval Low Complexity: 1 Low          Ellin Goodie, SPT  Ellin Goodie 05/22/2019, 6:38 PM

## 2019-05-22 NOTE — Progress Notes (Addendum)
PROGRESS NOTE  Isobel Eisenhuth OHY:[id number] DOB: 07/27/1936 DOA: 05/11/2019 PCP: Tomasa Hose, NP  Brief History   Kristina May is an 83 y.o. female with medical history significant ofCOPD, hypertension, hyperlipidemia, rheumatoid arthritis, hypothyroidism, paroxysmal atrial fibrillation and PE on Eliquispresented to the ED via EMS per family request on May 11, 2019 with a 2-week history of refusal to take medications. Patient told ED provider that she stopped taking medications because she did not see a point in living anymore and wanted to die.    She was admitted to a telemet6ry bed. She has been restarted on her medications. Psychiatry was consulted and the patient has been deemed appropriate for inpatient geri-psych bed. She is on a waiting list. She is not a candidate for [facility] due to her age.   Psychiatry has been asked to re-evaluate due to nursing reports of patient improvement and difficulty in obtaining a geri-psych bed. I have re-consulted them.  Consultants  . Psychiatry  Procedures  . None  Antibiotics  . None  Interval History/Subjective  The patient is sitting up in bed. No new complaints.  Objective   Vitals:  Vitals:   05/22/19 1040 05/22/19 1211  BP: 135/72 138/81  Pulse: 69 76  Resp:  17  Temp:  97.6 F (36.4 C)  SpO2: 96% 97%    Exam:  Constitutional:  . The patient is awake, alert, and oriented x 3. No acute distress. Marland Kitchen  Respiratory:  . No increased work of breathing. . No wheezes, rales, or rhonchi. . No tactile fremitus. Cardiovascular:  . Regular rate and rhythm. . No murmurs, ectopy, or gallups . No lateral PMI. No thrills. Marland Kitchen 1-2 + pitting edema of lower extremitiese. Abdomen:  . Abdomen is soft, non-tender, non-distended. . No hernias, masses, or organomegaly. . Normoactive bowel sounds.  Musculoskeletal:  . No cyanosis or clubbing . 1-2+ pitting edema of lower extremities bilaterally Skin:  . No rashes, lesions,  ulcers . palpation of skin: no induration or nodules Neurologic:  . CN 2-12 intact . Sensation all 4 extremities intact Psychiatric:  . Mental status o Mood, affect appropriate o Orientation to person, place, time  . judgment and insight appear intact     I have personally reviewed the following:   Today's Data  . Vitals  Scheduled Meds: . acidophilus  1 capsule Oral Daily  . apixaban  5 mg Oral BID  . aspirin EC  81 mg Oral Daily  . cholecalciferol  2,000 Units Oral Daily  . diclofenac sodium  2 g Topical QID  . fluticasone furoate-vilanterol  1 puff Inhalation Daily  . folic acid  1 mg Oral Daily  . furosemide  40 mg Oral BID  . guaiFENesin  600 mg Oral BID  . leflunomide  10 mg Oral Daily  . levothyroxine  150 mcg Oral QAC breakfast  . magnesium chloride  1 tablet Oral Daily  . metoprolol succinate  50 mg Oral BID  . nystatin   Topical TID  . potassium chloride  40 mEq Oral BID  . pravastatin  40 mg Oral QHS  . predniSONE  5 mg Oral BID WC  . traZODone  50 mg Oral QHS  . umeclidinium bromide  1 puff Inhalation Daily  . zinc oxide  1 application Topical BID   Continuous Infusions: . sodium chloride 250 mL (05/15/19 0514)    Principal Problem:   MDD (major depressive disorder), recurrent severe, without psychosis ([place]) Active Problems:   Hypokalemia  QT prolongation   UTI (urinary tract infection)   Suicidal ideation   Venous stasis   LOS: 9 days   A & P  Hypokalemia, QT prolongation on EKG: Potassium replete.. Monitor potassium and magnesium. QTc corrected.  Volume overload: Continue cautious diureses. Monitor creatinine, electrolytes, and volume status.  UTI: UA with negative leukocyte esterase and 0-5 WBCs,however, nitrite positive. Does have mild leukocytosis. The patient was started on ceftriaxone, but she has been converted to oral antibiotics. Urine culture has grown out e.coli.  Suicidal ideation: Psychiatry evaluated the patient and  recommended inpatient geriatric psych facility.The patient is medically cleared for discharge to geriatric psychiatric facility. Suicide precautions/sitter in place. However, nursing has reported improvement in the patient's demeanor. At case management's request I have re-consulted psychiatry so that the patient may be re-evaluated to see if geri-psych discharge is still warranted. Dr. Sharma Covert has advised me that the patient is now cleared from a psych perspective.   Hyponatremia: Felt to be due to volume overload. Resolved. Monitor.  Hypothyroidism: Patient has not taken thyroid replacement therapy for the past 2 weeks. TSH 14.7, free T4 normal. She has been restarted on her synthroid. She should have her thyroid functions repeated in 6 weeks.   COPD: Continue home inhalers plus PRN duoneb.Chest x-ray shows atelectasis. She has been placed on incentive spirometry.  Paroxysmal atrial fibrillation: Continue home Eliquis and metoprolol.  History of PE: Continue home Eliquis.  Hyperlipidemia: Continue home pravastatin.  Rheumatoid arthritis: The patient has been continued on her chronic low dose prednisone and leflunomide. She uses voltaren gel as needed.  Osteoarthritis: Followed by pain management (note under care everywhere). Patient had quit taking her morphine at home already and no longer wants to take it going forward.  Morbid obesity: Body mass index is 42.37 kg/m.  Atelectasis: Continue incentive spirometry and mobilize.  I have seen and examined this patient myself. I have spent 35 minutes in her evaluation and care.   Family Communication/Anticipated D/C date and plan/Code Status   DVT prophylaxis: eliquis Code Status: Full Code.  Family Communication:  Disposition Plan: Patient is on waiting list for a geri-psych bed at [facility]. She is medically cleared and now cleared by psychiatry. Will consult PT/OT to eval for disposition.  Reisa Coppola, DO [facility] Direct contact: see [email or web address]  7PM-7AM contact night coverage as above 05/22/2019, 3:52 PM  LOS: 7 days

## 2019-05-23 DIAGNOSIS — F331 Major depressive disorder, recurrent, moderate: Secondary | ICD-10-CM

## 2019-05-23 NOTE — Progress Notes (Signed)
Spoke with daughter and patient. Daughter is aware that PT/OT recommends SNF however she would like for her mother to go [facility] and she already has bed for her. She also mentioned that she is ok if her mother comes come until facility have bed ready and she will take care of her in meantime. Daughter requested to speak with SW and MD  As well. Notified MD Swayze by secure chat and SW Lattie Haw by phone.

## 2019-05-23 NOTE — Progress Notes (Signed)
PROGRESS NOTE  Levina Boyack HDQ:[id number] DOB: 03-Sep-1936 DOA: 05/11/2019 PCP: Jettie Pagan, NP  Brief History   Oral Hallgren is an 83 y.o. female with medical history significant ofCOPD, hypertension, hyperlipidemia, rheumatoid arthritis, hypothyroidism, paroxysmal atrial fibrillation and PE on Eliquispresented to the ED via EMS per family request on May 11, 2019 with a 2-week history of refusal to take medications. Patient told ED provider that she stopped taking medications because she did not see a point in living anymore and wanted to die.    She was admitted to a telemet6ry bed. She has been restarted on her medications. Psychiatry was consulted and the patient has been deemed appropriate for inpatient geri-psych bed. She is on a waiting list. She is not a candidate for [facility] due to her age.   Psychiatry has been asked to re-evaluate due to nursing reports of patient improvement and difficulty in obtaining a geri-psych bed. They now have cleared the patient from a psychiatric standpoint.  Consultants  . Psychiatry  Procedures  . None  Antibiotics  . None  Interval History/Subjective  The patient is sitting up in bed. No new complaints.  Objective   Vitals:  Vitals:   05/23/19 1005 05/23/19 1132  BP: 136/75 102/78  Pulse: 70 85  Resp: 18 18  Temp:  98.2 F (36.8 C)  SpO2: 94% 95%    Exam:  Constitutional:  . The patient is awake, alert, and oriented x 3. No acute distress. Marland Kitchen  Respiratory:  . No increased work of breathing. . No wheezes, rales, or rhonchi. . No tactile fremitus. Cardiovascular:  . Regular rate and rhythm. . No murmurs, ectopy, or gallups . No lateral PMI. No thrills. Abdomen:  . Abdomen is soft, non-tender, non-distended. . No hernias, masses, or organomegaly. . Normoactive bowel sounds.  Musculoskeletal:  . No cyanosis or clubbing . 1-2+ pitting edema of lower extremities bilaterally Skin:  . No rashes, lesions, ulcers .  palpation of skin: no induration or nodules Neurologic:  . CN 2-12 intact . Sensation all 4 extremities intact Psychiatric:  . Mental status o Mood, affect appropriate o Orientation to person, place, time  . judgment and insight appear intact     I have personally reviewed the following:   Today's Data  . Vitals  Scheduled Meds: . acidophilus  1 capsule Oral Daily  . apixaban  5 mg Oral BID  . aspirin EC  81 mg Oral Daily  . cholecalciferol  2,000 Units Oral Daily  . diclofenac sodium  2 g Topical QID  . fluticasone furoate-vilanterol  1 puff Inhalation Daily  . folic acid  1 mg Oral Daily  . furosemide  40 mg Oral BID  . guaiFENesin  600 mg Oral BID  . leflunomide  10 mg Oral Daily  . levothyroxine  150 mcg Oral QAC breakfast  . magnesium chloride  1 tablet Oral Daily  . metoprolol succinate  50 mg Oral BID  . nystatin   Topical TID  . potassium chloride  40 mEq Oral BID  . pravastatin  40 mg Oral QHS  . predniSONE  5 mg Oral BID WC  . traZODone  50 mg Oral QHS  . umeclidinium bromide  1 puff Inhalation Daily  . zinc oxide  1 application Topical BID   Continuous Infusions: . sodium chloride 250 mL (05/15/19 0514)    Principal Problem:   MDD (major depressive disorder), recurrent episode, moderate (HCC) Active Problems:   Hypokalemia   QT prolongation  UTI (urinary tract infection)   Suicidal ideation   Venous stasis   MDD (major depressive disorder), recurrent severe, without psychosis ([place])   LOS: 10 days   A & P  Hypokalemia, QT prolongation on EKG: Potassium replete.. Monitor potassium and magnesium. QTc corrected.  Volume overload: Continue cautious diureses. Monitor creatinine, electrolytes, and volume status.  UTI: UA with negative leukocyte esterase and 0-5 WBCs,however, nitrite positive. Does have mild leukocytosis. The patient was started on ceftriaxone, but she has been converted to oral antibiotics. Urine culture has grown out e.coli.   Suicidal ideation: Psychiatry evaluated the patient and recommended inpatient geriatric psych facility.The patient is medically cleared for discharge to geriatric psychiatric facility. Suicide precautions/sitter in place. However, nursing has reported improvement in the patient's demeanor. At case management's request I have re-consulted psychiatry so that the patient may be re-evaluated to see if geri-psych discharge is still warranted. Dr. Mariea Clonts has advised me that the patient is now cleared from a psych perspective.   Hyponatremia: Felt to be due to volume overload. Resolved. Monitor.  Hypothyroidism: Patient has not taken thyroid replacement therapy for the past 2 weeks. TSH 14.7, free T4 normal. She has been restarted on her synthroid. She should have her thyroid functions repeated in 6 weeks.   COPD: Continue home inhalers plus PRN duoneb.Chest x-ray shows atelectasis. She has been placed on incentive spirometry.  Paroxysmal atrial fibrillation: Continue home Eliquis and metoprolol.  History of PE: Continue home Eliquis.  Hyperlipidemia: Continue home pravastatin.  Rheumatoid arthritis: The patient has been continued on her chronic low dose prednisone and leflunomide. She uses voltaren gel as needed.  Osteoarthritis: Followed by pain management (note under care everywhere). Patient had quit taking her morphine at home already and no longer wants to take it going forward.  Morbid obesity: Body mass index is 42.37 kg/m.  Atelectasis: Continue incentive spirometry and mobilize.  I have seen and examined this patient myself. I have spent 45 minutes in her evaluation and care. More than 50% of this time was spent in discussing the patient in detail with her daughter. All questions answered to the best of my abilities.   Family Communication/Anticipated D/C date and plan/Code Status   DVT prophylaxis: eliquis Code Status: Full Code.  Family Communication:   Disposition Plan: Patient's daughter has arranged for an independent living facility for the patient. However, at this time PT is recommending SNF. I will discuss these options with PT/OT.  Maryama Kuriakose, DO [facility] Direct contact: see [email or web address]  7PM-7AM contact night coverage as above 05/23/2019, 4:06 PM  LOS: 7 days

## 2019-05-23 NOTE — Progress Notes (Addendum)
Psychiatric MD assess via IPAD.  Patient deemed clear and suicide precaution removed.

## 2019-05-23 NOTE — Progress Notes (Signed)
Occupational Therapy Evaluation Patient Details Name: Kristina May MRN: [id number] DOB: 1935/10/05 Today's Date: 05/23/2019    History of Present Illness Pt is an 83 year old female with major depressive disorder presenting with hypokalemia and QT prolongation on EKG following noncompliance with pharmacological interventions. Pt with suicidal ideation initially, but has since been cleared by psychiatry. Relevant PMH includes COPD, HLD, HTN, RA, hypothyroidism, paroxysmal a fib, and PE.   Clinical Impression   PTA, pt was living at home with her daughter, who assisted her with ADL/IADL and pt was modified independent with functional mobility at rollator level. Pt reports she has been progressively deconditioning impacting ability to prepare meals and maintain independence. Pt reports she feels she "does not want to impose" on her family, pt may benefit from long-term ALF placement. Upon arrival pt sitting at sink level completing bathing. Pt in high spirits this session, making appropriate jokes and laughing. Pt discussed staff's concern for her mental health. She states that she stopped taking her medication to "see how her body would react", discussed importance of discussing such topics with her physician prior to making any changes to her medication management. Pt currently requires supervision-minguard for ADL completion and minguard for functional mobility at rollator level, pt able to ambulate short distances without use of AD with mingaurd. Due to decline in current level of function, pt would benefit from acute OT to address established goals to facilitate safe D/C to venue listed below. At this time, recommend SNF follow-up. Pt may benefit from long-term ALF placement. Will continue to follow acutely.     Follow Up Recommendations  SNF(pt may benefit from long-term ALF)    Equipment Recommendations  None recommended by OT    Recommendations for Other Services       Precautions /  Restrictions Precautions Precautions: Fall Restrictions Weight Bearing Restrictions: No      Mobility Bed Mobility Overal bed mobility: Modified Independent             General bed mobility comments: seated on rollator at sink level upon arrival;modified independent sit>supine  Transfers Overall transfer level: Needs assistance Equipment used: None Transfers: Sit to/from Stand Sit to Stand: Min guard         General transfer comment: Pt completes sit to stand transfer with min G assist. Pt completes transfer with increased time.    Balance Overall balance assessment: Needs assistance Sitting-balance support: Feet supported Sitting balance-Leahy Scale: Good     Standing balance support: No upper extremity supported Standing balance-Leahy Scale: Fair                             ADL either performed or assessed with clinical judgement   ADL Overall ADL's : At baseline                                       General ADL Comments: Pt completed grooming sitting at sink level;functional mobility at supervision to minguard level pt reports she feels she is at baseline;reports she has been having increased difficulty with meal prep due to decreased activity tolerance and increase in wob during ADL;pt minguard without use of AD;Supervision with use of rollator     Vision         Perception     Praxis      Pertinent Vitals/Pain Pain Assessment: No/denies pain  Hand Dominance Right   Extremity/Trunk Assessment Upper Extremity Assessment Upper Extremity Assessment: Overall WFL for tasks assessed   Lower Extremity Assessment Lower Extremity Assessment: Generalized weakness   Cervical / Trunk Assessment Cervical / Trunk Assessment: Normal   Communication Communication Communication: HOH   Cognition Arousal/Alertness: Awake/alert Behavior During Therapy: WFL for tasks assessed/performed Overall Cognitive Status: Within  Functional Limits for tasks assessed                                 General Comments: pt in good spirits this session, laughing and discussing that she plans to be more compliant with medication management;pt expressed desire to speak with staff to review her lab results and other medical information;pt is interested in the effect her medication has on her health, states this is why she "stopped taking them", so she could "see the reaction in my body"discussed importance of pt discussing medication with MD prior to making any changes to her prescriptions/doses   General Comments  discussed importance of mobility and energy conservation strategies during ADL/IADL and functional mobility    Exercises     Shoulder Instructions      Home Living Family/patient expects to be discharged to:: Private residence Living Arrangements: Children Available Help at Discharge: Family;Available 24 hours/day(Daughter available, but also caregiver for spouse) Type of Home: House Home Access: Stairs to enter [employer] of Steps: 5 Entrance Stairs-Rails: Can reach both Home Layout: One level     Bathroom Shower/Tub: Chief Strategy Officer: Standard     Home Equipment: Environmental consultant - 4 wheels;Walker - 2 wheels;Cane - single point;Toilet riser;Shower seat          Prior Functioning/Environment Level of Independence: Needs assistance  Gait / Transfers Assistance Needed: Pt ambulates with rollator. Pt reports she is unable to cover long distances as she experiences wheezing and becomes SOB. Pt also reports difficulty with steps. ADL's / Homemaking Assistance Needed: needs some assistance with dressing and bathing            OT Problem List: Decreased activity tolerance;Impaired balance (sitting and/or standing);Decreased safety awareness      OT Treatment/Interventions: Self-care/ADL training;Therapeutic exercise;Energy conservation;DME and/or AE  instruction;Therapeutic activities;Patient/family education;Balance training    OT Goals(Current goals can be found in the care plan section) Acute Rehab OT Goals Patient Stated Goal: to go to facility  OT Goal Formulation: With patient Time For Goal Achievement: 06/06/19 Potential to Achieve Goals: Good ADL Goals Pt Will Perform Grooming: with modified independence;sitting Pt Will Perform Upper Body Dressing: with modified independence;sitting Pt Will Perform Lower Body Dressing: with modified independence;with adaptive equipment;sit to/from stand Pt Will Transfer to Toilet: with modified independence;ambulating Pt Will Perform Tub/Shower Transfer: with modified independence Additional ADL Goal #1: Pt will complete medication management independently with 100% accuracy.  OT Frequency: Min 2X/week   Barriers to D/C: Decreased caregiver support  pt feels her family is not able to assist her the level she desires/needs and states she "doesn't want to be a burden"       Co-evaluation              AM-PAC OT "6 Clicks" Daily Activity     Outcome Measure Help from another person eating meals?: A Little Help from another person taking care of personal grooming?: A Little Help from another person toileting, which includes using toliet, bedpan, or urinal?: A Little Help from another person bathing (  including washing, rinsing, drying)?: A Little Help from another person to put on and taking off regular upper body clothing?: A Little Help from another person to put on and taking off regular lower body clothing?: A Lot 6 Click Score: 17   End of Session Equipment Utilized During Treatment: (rollator) Nurse Communication: Mobility status  Activity Tolerance: Patient tolerated treatment well Patient left: in bed;with call bell/phone within reach;with bed alarm set  OT Visit Diagnosis: Other symptoms and signs involving cognitive function;Other abnormalities of gait and mobility (R26.89)                 Time: 5784-6962 OT Time Calculation (min): 40 min Charges:  OT General Charges $OT Visit: 1 Visit OT Evaluation $OT Eval Moderate Complexity: 1 Mod OT Treatments $Self Care/Home Management : 23-37 mins  Dorinda Hill OTR/L Acute Rehabilitation Services Office: [place] 05/23/2019, 1:05 PM

## 2019-05-24 NOTE — Progress Notes (Signed)
CSW spoke with patient's daughter Jenny Reichmann via phone and she informed CSW that she has spoken to MD and awaiting feedback for OT/PT. Jenny Reichmann stated that patient's has been in to a SNF before and did not improve. Jenny Reichmann stated that she is open to what is recommended however could bring patient home as well.  CSW informed Jenny Reichmann that further follow up would occur after OT/PT has been consulted.

## 2019-05-24 NOTE — Progress Notes (Signed)
PROGRESS NOTE  Kristina May WGY:[id number] DOB: 11-18-1935 DOA: 05/11/2019 PCP: Jettie Pagan, NP  Brief History   Kristina May is an 83 y.o. female with medical history significant ofCOPD, hypertension, hyperlipidemia, rheumatoid arthritis, hypothyroidism, paroxysmal atrial fibrillation and PE on Eliquispresented to the ED via EMS per family request on May 11, 2019 with a 2-week history of refusal to take medications. Patient told ED provider that she stopped taking medications because she did not see a point in living anymore and wanted to die.    She was admitted to a telemet6ry bed. She has been restarted on her medications. Psychiatry was consulted and the patient has been deemed appropriate for inpatient geri-psych bed. She is on a waiting list. She is not a candidate for [facility] due to her age.   Psychiatry has been asked to re-evaluate due to nursing reports of patient improvement and difficulty in obtaining a geri-psych bed. They now have cleared the patient from a psychiatric standpoint.  Disposition is to SNF or Independent living.  Consultants  . Psychiatry  Procedures  . None  Antibiotics  . None  Interval History/Subjective  The patient is sitting up in bed. No new complaints.  Objective   Vitals:  Vitals:   05/24/19 0934 05/24/19 1304  BP: 134/65 138/72  Pulse: 66 70  Resp:  18  Temp:  98.4 F (36.9 C)  SpO2:  95%    Exam:  Constitutional:  . The patient is awake, alert, and oriented x 3. No acute distress. Respiratory:  . No increased work of breathing. . No wheezes, rales, or rhonchi. . No tactile fremitus. Cardiovascular:  . Regular rate and rhythm. . No murmurs, ectopy, or gallups . No lateral PMI. No thrills. Abdomen:  . Abdomen is soft, non-tender, non-distended. . No hernias, masses, or organomegaly. . Normoactive bowel sounds.  Musculoskeletal:  . No cyanosis or clubbing . 1-2+ pitting edema of lower extremities bilaterally  Skin:  . No rashes, lesions, ulcers . palpation of skin: no induration or nodules Neurologic:  . CN 2-12 intact . Sensation all 4 extremities intact Psychiatric:  . Mental status o Mood, affect appropriate o Orientation to person, place, time  . judgment and insight appear intact    I have personally reviewed the following:   Today's Data  . Vitals  Scheduled Meds: . acidophilus  1 capsule Oral Daily  . apixaban  5 mg Oral BID  . aspirin EC  81 mg Oral Daily  . cholecalciferol  2,000 Units Oral Daily  . diclofenac sodium  2 g Topical QID  . fluticasone furoate-vilanterol  1 puff Inhalation Daily  . folic acid  1 mg Oral Daily  . furosemide  40 mg Oral BID  . guaiFENesin  600 mg Oral BID  . leflunomide  10 mg Oral Daily  . levothyroxine  150 mcg Oral QAC breakfast  . magnesium chloride  1 tablet Oral Daily  . metoprolol succinate  50 mg Oral BID  . nystatin   Topical TID  . potassium chloride  40 mEq Oral BID  . pravastatin  40 mg Oral QHS  . predniSONE  5 mg Oral BID WC  . traZODone  50 mg Oral QHS  . umeclidinium bromide  1 puff Inhalation Daily  . zinc oxide  1 application Topical BID   Continuous Infusions: . sodium chloride 250 mL (05/15/19 0514)    Principal Problem:   MDD (major depressive disorder), recurrent episode, moderate (HCC) Active Problems:  Hypokalemia   QT prolongation   UTI (urinary tract infection)   Suicidal ideation   Venous stasis   MDD (major depressive disorder), recurrent severe, without psychosis ([place])   LOS: 11 days   A & P  Hypokalemia, QT prolongation on EKG: Potassium replete.. Monitor potassium and magnesium. QTc corrected.  Volume overload: Continue cautious diureses. Monitor creatinine, electrolytes, and volume status.  UTI: UA with negative leukocyte esterase and 0-5 WBCs,however, nitrite positive. Does have mild leukocytosis. The patient was started on ceftriaxone, but she has been converted to oral antibiotics.  Urine culture has grown out e.coli.  Suicidal ideation: Psychiatry evaluated the patient and recommended inpatient geriatric psych facility.The patient is medically cleared for discharge to geriatric psychiatric facility. Suicide precautions/sitter in place. However, nursing has reported improvement in the patient's demeanor. At case management's request I have re-consulted psychiatry so that the patient may be re-evaluated to see if geri-psych discharge is still warranted. Dr. Mariea Clonts has advised me that the patient is now cleared from a psych perspective.   Hyponatremia: Felt to be due to volume overload. Resolved. Monitor.  Hypothyroidism: Patient has not taken thyroid replacement therapy for the past 2 weeks. TSH 14.7, free T4 normal. She has been restarted on her synthroid. She should have her thyroid functions repeated in 6 weeks.   COPD: Continue home inhalers plus PRN duoneb.Chest x-ray shows atelectasis. She has been placed on incentive spirometry.  Paroxysmal atrial fibrillation: Continue home Eliquis and metoprolol.  History of PE: Continue home Eliquis.  Hyperlipidemia: Continue home pravastatin.  Rheumatoid arthritis: The patient has been continued on her chronic low dose prednisone and leflunomide. She uses voltaren gel as needed.  Osteoarthritis: Followed by pain management (note under care everywhere). Patient had quit taking her morphine at home already and no longer wants to take it going forward.  Morbid obesity: Body mass index is 42.37 kg/m.  Atelectasis: Continue incentive spirometry and mobilize.  I have seen and examined this patient myself. I have spent 32 minutes in her evaluation and care.   Family Communication/Anticipated D/C date and plan/Code Status   DVT prophylaxis: eliquis Code Status: Full Code.  Family Communication:  Disposition Plan: Patient's daughter has arranged for an independent living facility for the patient. However, at  this time PT is recommending SNF. I will discuss these options with PT/OT.  Robert Sperl, DO [facility] Direct contact: see [email or web address]  7PM-7AM contact night coverage as above 05/24/2019, 5:15 PM  LOS: 7 days

## 2019-05-25 NOTE — Progress Notes (Signed)
Physical Therapy Treatment Patient Details Name: Kristina May MRN: [id number] DOB: Feb 16, 1936 Today's Date: 05/25/2019    History of Present Illness Pt is an 83 year old female with major depressive disorder presenting with hypokalemia and QT prolongation on EKG following noncompliance with pharmacological interventions. Pt with suicidal ideation initially, but has since been cleared by psychiatry. Relevant PMH includes COPD, HLD, HTN, RA, hypothyroidism, paroxysmal a fib, and PE.    PT Comments    Pt pleasant awaiting breakfast on arrival . Pt able to progress to short hall ambulation with 3/4 DOE end of gait with reliance on rollator. PT educated for bil LE HEP and able to perform end of session. 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 Pt has planned to move to ALF which is appropriate given current functiona and ability to perform basic transfers without assist. Pt limited by fatigue and SOB with continued attempts for limited mobility encouraged.     Follow Up Recommendations  Home health PT;Other (comment);Supervision - Intermittent(HHPT at ALF)     Equipment Recommendations  None recommended by PT    Recommendations for Other Services       Precautions / Restrictions Precautions Precautions: Fall    Mobility  Bed Mobility Overal bed mobility: Modified Independent             General bed mobility comments: HOB 40 degrees with use of rail to pivot to EOB. Pt sleeps in recliner x 5 years  Transfers Overall transfer level: Modified independent               General transfer comment: pt able to stand from bed without need for cues or assist  Ambulation/Gait Ambulation/Gait assistance: Supervision Gait Distance (Feet): 60 Feet Assistive device: Rolling walker (2 wheeled) Gait Pattern/deviations: Step-through pattern;Trunk flexed;Decreased stride length;Wide base of support   Gait velocity interpretation: 1.31 - 2.62 ft/sec, indicative of  limited community ambulator General Gait Details: supervision for safety with use of rollator pt able to progress to short distance in hall ambulation with 3/4 DOE end of gait with sats 93% on RA   Stairs             Wheelchair Mobility    Modified Rankin (Stroke Patients Only)       Balance Overall balance assessment: Needs assistance Sitting-balance support: Feet supported;No upper extremity supported Sitting balance-Leahy Scale: Good     Standing balance support: Single extremity supported Standing balance-Leahy Scale: Poor                              Cognition Arousal/Alertness: Awake/alert Behavior During Therapy: WFL for tasks assessed/performed Overall Cognitive Status: Within Functional Limits for tasks assessed                                        Exercises General Exercises - Lower Extremity Long Arc Quad: AROM;20 reps;Both;Seated Hip Flexion/Marching: AROM;Both;15 reps;Seated    General Comments        Pertinent Vitals/Pain Pain Assessment: 0-10 Pain Score: 4  Pain Location: bil knees Pain Descriptors / Indicators: Aching Pain Intervention(s): Limited activity within patient's tolerance;Repositioned;Monitored during session    Home Living                      Prior Function  PT Goals (current goals can now be found in the care plan section) Progress towards PT goals: Progressing toward goals    Frequency    Min 2X/week      PT Plan Discharge plan needs to be updated    Co-evaluation              AM-PAC PT "6 Clicks" Mobility   Outcome Measure  Help needed turning from your back to your side while in a flat bed without using bedrails?: A Little Help needed moving from lying on your back to sitting on the side of a flat bed without using bedrails?: A Little Help needed moving to and from a bed to a chair (including a wheelchair)?: A Little Help needed standing up from a  chair using your arms (e.g., wheelchair or bedside chair)?: A Little Help needed to walk in hospital room?: A Little Help needed climbing 3-5 steps with a railing? : A Lot 6 Click Score: 17    End of Session Equipment Utilized During Treatment: Gait belt Activity Tolerance: Patient limited by fatigue Patient left: with call bell/phone within reach;in bed Nurse Communication: Mobility status PT Visit Diagnosis: Unsteadiness on feet (R26.81);Other abnormalities of gait and mobility (R26.89);Muscle weakness (generalized) (M62.81)     Time: 6945-0388 PT Time Calculation (min) (ACUTE ONLY): 27 min  Charges:  $Gait Training: 8-22 mins $Therapeutic Exercise: 8-22 mins                     Lataisha Colan Pam Drown, PT Acute Rehabilitation Services Pager: [phone] Office: [place] 05/25/2019, 1:08 PM

## 2019-05-25 NOTE — Care Management Important Message (Signed)
Important Message  Patient Details  Name: Kristina May MRN: [id number] Date of Birth: [birth year]   Medicare Important Message Given:  Yes     [name] 05/25/2019, 12:57 PM

## 2019-05-25 NOTE — Progress Notes (Signed)
PROGRESS NOTE  Kristina May DOB: May 13, 1936 DOA: 05/11/2019 PCP: Jettie Pagan, NP  Brief History   Kristina May is an 83 y.o. female with medical history significant ofCOPD, hypertension, hyperlipidemia, rheumatoid arthritis, hypothyroidism, paroxysmal atrial fibrillation and PE on Eliquispresented to the ED via EMS per family request on May 11, 2019 with a 2-week history of refusal to take medications. Patient told ED provider that she stopped taking medications because she did not see a point in living anymore and wanted to die.    She was admitted to a telemet6ry bed. She has been restarted on her medications. Psychiatry was consulted and the patient has been deemed appropriate for inpatient geri-psych bed. She is on a waiting list. She is not a candidate for [facility] due to her age.   Psychiatry has been asked to re-evaluate due to nursing reports of patient improvement and difficulty in obtaining a geri-psych bed. They now have cleared the patient from a psychiatric standpoint.  The patient has been re-evaluated by PT. She will be discharged to home with PT//OT home health. Her daughter has arranged for her to go to independent living. She has stated that her mother can stay with her until all arrangements are made.  Consultants  . Psychiatry  Procedures  . None  Antibiotics  . None  Interval History/Subjective  The patient is sitting up in bed. No new complaints.  Objective   Vitals:  Vitals:   05/25/19 0847 05/25/19 1332  BP:  121/77  Pulse:  69  Resp:  18  Temp:  98.4 F (36.9 C)  SpO2: 95% 95%    Exam:  Constitutional:  . The patient is awake, alert, and oriented x 3. No acute distress. Respiratory:  . No increased work of breathing. . No wheezes, rales, or rhonchi. . No tactile fremitus. Cardiovascular:  . Regular rate and rhythm. . No murmurs, ectopy, or gallups . No lateral PMI. No thrills. Abdomen:  . Abdomen is soft,  non-tender, non-distended. . No hernias, masses, or organomegaly. . Normoactive bowel sounds.  Musculoskeletal:  . No cyanosis or clubbing . 1-2+ pitting edema of lower extremities bilaterally Skin:  . No rashes, lesions, ulcers . palpation of skin: no induration or nodules Neurologic:  . CN 2-12 intact . Sensation all 4 extremities intact Psychiatric:  . Mental status o Mood, affect appropriate o Orientation to person, place, time  . judgment and insight appear intact    I have personally reviewed the following:   Today's Data  . Vitals  Scheduled Meds: . acidophilus  1 capsule Oral Daily  . apixaban  5 mg Oral BID  . aspirin EC  81 mg Oral Daily  . cholecalciferol  2,000 Units Oral Daily  . diclofenac sodium  2 g Topical QID  . fluticasone furoate-vilanterol  1 puff Inhalation Daily  . folic acid  1 mg Oral Daily  . furosemide  40 mg Oral BID  . guaiFENesin  600 mg Oral BID  . leflunomide  10 mg Oral Daily  . levothyroxine  150 mcg Oral QAC breakfast  . magnesium chloride  1 tablet Oral Daily  . metoprolol succinate  50 mg Oral BID  . nystatin   Topical TID  . potassium chloride  40 mEq Oral BID  . pravastatin  40 mg Oral QHS  . predniSONE  5 mg Oral BID WC  . traZODone  50 mg Oral QHS  . umeclidinium bromide  1 puff Inhalation Daily  . zinc  oxide  1 application Topical BID   Continuous Infusions: . sodium chloride 250 mL (05/15/19 0514)    Principal Problem:   MDD (major depressive disorder), recurrent episode, moderate (HCC) Active Problems:   Hypokalemia   QT prolongation   UTI (urinary tract infection)   Suicidal ideation   Venous stasis   MDD (major depressive disorder), recurrent severe, without psychosis ([place])   LOS: 12 days   A & P  Hypokalemia, QT prolongation on EKG: Potassium replete.. Monitor potassium and magnesium. QTc corrected.  Volume overload: Continue cautious diureses. Monitor creatinine, electrolytes, and volume status. Resolved.   UTI: UA with negative leukocyte esterase and 0-5 WBCs,however, nitrite positive. Does have mild leukocytosis. The patient was started on ceftriaxone, but she has been converted to oral antibiotics. Urine culture has grown out e.coli. antibiotics completed.  Suicidal ideation: Psychiatry evaluated the patient and recommended inpatient geriatric psych facility.The patient is medically cleared for discharge to geriatric psychiatric facility. Suicide precautions/sitter in place. However, nursing has reported improvement in the patient's demeanor. At case management's request I have re-consulted psychiatry so that the patient may be re-evaluated to see if geri-psych discharge is still warranted. Dr. Mariea Clonts has advised me that the patient is now cleared from a psych perspective.   Hyponatremia: Felt to be due to volume overload. Resolved. Monitor.  Hypothyroidism: Patient has not taken thyroid replacement therapy for the past 2 weeks. TSH 14.7, free T4 normal. She has been restarted on her synthroid. She should have her thyroid functions repeated in 6 weeks.   COPD: Continue home inhalers plus PRN duoneb.Chest x-ray shows atelectasis. She has been placed on incentive spirometry.  Paroxysmal atrial fibrillation: Continue home Eliquis and metoprolol.  History of PE: Continue home Eliquis.  Hyperlipidemia: Continue home pravastatin.  Rheumatoid arthritis: The patient has been continued on her chronic low dose prednisone and leflunomide. She uses voltaren gel as needed.  Osteoarthritis: Followed by pain management (note under care everywhere). Patient had quit taking her morphine at home already and no longer wants to take it going forward.  Morbid obesity: Body mass index is 42.37 kg/m.  Atelectasis: Continue incentive spirometry and mobilize.  I have seen and examined this patient myself. I have spent 30 minutes in her evaluation and care.   Family Communication/Anticipated  D/C date and plan/Code Status   DVT prophylaxis: eliquis Code Status: Full Code.  Family Communication:  Disposition Plan: Patient's daughter has arranged for an independent living facility for the patient. However, at this time PT is recommending SNF. I will discuss these options with PT/OT.  Kristina Rhee, DO [facility] Direct contact: see [email or web address]  7PM-7AM contact night coverage as above 05/24/2019, 5:15 PM  LOS: 7 days

## 2019-05-26 MED ORDER — FUROSEMIDE 40 MG PO TABS: 40.0000 mg | ORAL_TABLET | Freq: Two times a day (BID) | ORAL | 0 refills | Status: DC

## 2019-05-26 MED ORDER — MAGNESIUM CHLORIDE 64 MG PO TBEC: 1.0000 | DELAYED_RELEASE_TABLET | Freq: Every day | ORAL | 0 refills | Status: DC

## 2019-05-26 MED ORDER — POTASSIUM CHLORIDE CRYS ER 20 MEQ PO TBCR: 40.0000 meq | EXTENDED_RELEASE_TABLET | Freq: Two times a day (BID) | ORAL | 0 refills | Status: DC

## 2019-05-26 MED ORDER — RISAQUAD PO CAPS: 1.0000 | ORAL_CAPSULE | Freq: Every day | ORAL | 0 refills | Status: DC

## 2019-05-26 MED ORDER — TRAZODONE HCL 50 MG PO TABS: 50.0000 mg | ORAL_TABLET | Freq: Every day | ORAL | 0 refills | Status: DC

## 2019-05-26 MED ORDER — NYSTATIN 100000 UNIT/GM EX POWD: Freq: Three times a day (TID) | CUTANEOUS | 0 refills | Status: DC

## 2019-05-26 NOTE — Discharge Summary (Signed)
Physician Discharge Summary  Nigel BertholdGisela Pote ZOX:[id number] DOB: [birth year] DOA: 05/11/2019  PCP: Jettie PaganFalstreau, Brooke A, NP  Admit date: 05/11/2019 Discharge date: 05/26/2019  Recommendations for Outpatient Follow-up:  1. Patient is discharged to home with her daughter. She will move to independent living facility when arrangements are made. 2. Home health PT/OT.  3. Follow up with PCP in 7-10 days.  Follow-up Information    Jettie PaganFalstreau, Brooke A, NP. Go on 06/01/2019.   Specialty: Nurse Practitioner Why: @1 :00pm Contact information: [address] [place] [id number] [phone]        Home, [facility] Follow up.   Specialty: Home Health Services Why: [facility] will call you to schedule home PT/OT services. Contact information: [address] [place] [address] [phone]          Discharge Diagnoses: Principal diagnosis is #1 1. Hypokalemia 2. Volume overload 3. QT prolongation on EKG 4. UTI 5. Suicidal ideation: Cleared by psychiatry 6. Hyponatremia 7. Hypothyroidism 8. COPD 9. Paroxysmal atrial fibrillation 10. History of PE 11. Hyperlipidemia 12. Rheumatoid arthritis 13. Osteoarthritis 14. Morbid Obesity 15. Atelectasis  Discharge Condition: Fair  Disposition: Home with daughter and then to independent living.  Diet recommendation: heart healthy  Filed Weights   05/24/19 0320 05/25/19 0553 05/26/19 0524  Weight: 113.2 kg 113.8 kg 113.4 kg    History of present illness:  Kristina MangesGisela Deblasiois an 83 y.o.femalewith medical history significant ofCOPD, hypertension, hyperlipidemia, rheumatoid arthritis, hypothyroidism, paroxysmal atrial fibrillation and PE on Eliquispresented to the ED via EMS per family request on May 11, 2019 with a 2-week history of refusal to take medications. Patient told ED provider that she stopped taking medications because she did not see a point in living anymore and wanted to die.   Hospital Course:   She was admitted to a telemet6ry bed. She has been restarted on her medications. Psychiatry was consulted and the patient has been deemed appropriate for inpatient geri-psych bed. She is on a waiting list. [facility] due to her age.   Psychiatry has been asked to re-evaluate due to nursing reports of patient improvement and difficulty in obtaining a geri-psych bed. They now have cleared the patient from a psychiatric standpoint.  The patient has been re-evaluated by PT. She will be discharged to home with PT//OT home health. Her daughter has arranged for her to go to independent living. She has stated that her mother can stay with her until all arrangements are made.  Today's assessment: S: The patient is awake, alert, and oriented x 3. No acute distress. O: Vitals:  Vitals:   05/26/19 0949 05/26/19 1143  BP: (!) 144/57 (!) 143/74  Pulse: 69 73  Resp: 12 13  Temp: 98.4 F (36.9 C) 98.1 F (36.7 C)  SpO2: 96% 96%    Constitutional:   The patient is awake, alert, and oriented x 3. No acute distress. Respiratory:   No increased work of breathing.  No wheezes, rales, or rhonchi.  No tactile fremitus. Cardiovascular:   Regular rate and rhythm.  No murmurs, ectopy, or gallups  No lateral PMI. No thrills. Abdomen:   Abdomen is soft, non-tender, non-distended.  No hernias, masses, or organomegaly.  Normoactive bowel sounds.  Musculoskeletal:   No cyanosis or clubbing  1-2+ pitting edema of lower extremities bilaterally Skin:   No rashes, lesions, ulcers  palpation of skin: no induration or nodules Neurologic:   CN 2-12 intact  Sensation all 4 extremities intact  Psychiatric:   Mental status ? Mood, affect appropriate ? Orientation to person, place, time   judgment and insight appear intact     Discharge Instructions  Discharge Instructions    Activity as tolerated - No restrictions   Complete by: As directed    Call MD for:   difficulty breathing, headache or visual disturbances   Complete by: As directed    Call MD for:  extreme fatigue   Complete by: As directed    Call MD for:  severe uncontrolled pain   Complete by: As directed    Diet - low sodium heart healthy   Complete by: As directed    Increase activity slowly   Complete by: As directed      Allergies as of 05/26/2019      Reactions   Latex Other (See Comments)   Causes blisters   Betamethasone Hives   Dexamethasone Hives   Tape Other (See Comments)   Caused blisters    Tylenol [acetaminophen] Other (See Comments)   Doesn't work for patient    Remicade [infliximab] Other (See Comments)   She went to rehab because of it   Claritin [loratadine] Other (See Comments)   Doesn't work for patient      Medication List    STOP taking these medications   docusate sodium 100 MG capsule Commonly known as: COLACE   metolazone 2.5 MG tablet Commonly known as: ZAROXOLYN   SENNA-MAX PO     TAKE these medications   acidophilus Caps capsule Take 1 capsule by mouth daily.   aspirin EC 81 MG tablet Take 81 mg by mouth daily.   Eliquis 5 MG Tabs tablet Generic drug: apixaban Take 5 mg by mouth 2 (two) times daily.   fexofenadine 180 MG tablet Commonly known as: ALLEGRA Take 180 mg by mouth daily.   folic acid 1 MG tablet Commonly known as: FOLVITE Take 1 mg by mouth daily.   furosemide 40 MG tablet Commonly known as: LASIX Take 1 tablet (40 mg total) by mouth 2 (two) times daily. What changed:   medication strength  how much to take  when to take this   guaiFENesin 600 MG 12 hr tablet Commonly known as: MUCINEX Take by mouth 2 (two) times daily.   ipratropium-albuterol 0.5-2.5 (3) MG/3ML Soln Commonly known as: DUONEB Take 3 mLs by nebulization every 6 (six) hours as needed (wheezing).   leflunomide 10 MG tablet Commonly known as: ARAVA Take 10 mg by mouth daily.   levothyroxine 150 MCG tablet Commonly known as:  SYNTHROID Take 150 mcg by mouth daily before breakfast.   magnesium chloride 64 MG Tbec SR tablet Commonly known as: SLOW-MAG Take 1 tablet (64 mg total) by mouth daily.   metoprolol succinate 50 MG 24 hr tablet Commonly known as: TOPROL-XL Take 50 mg by mouth 2 (two) times daily. Take with or immediately following a meal.   naproxen sodium 220 MG tablet Commonly known as: ALEVE Take 220 mg by mouth daily as needed (pain).   nystatin powder Commonly known as: MYCOSTATIN/NYSTOP Apply topically 3 (three) times daily.   potassium chloride SA 20 MEQ tablet Commonly known as: K-DUR Take 2 tablets (40 mEq total) by mouth 2 (two) times daily. What changed:   how much to take  when to take this   pravastatin 40 MG tablet Commonly known as: PRAVACHOL Take 40 mg by mouth at bedtime.   predniSONE 5 MG tablet Commonly known as: DELTASONE Take 5 mg  by mouth 2 (two) times daily with a meal.   tiotropium 18 MCG inhalation capsule Commonly known as: SPIRIVA Place 18 mcg into inhaler and inhale daily.   traZODone 50 MG tablet Commonly known as: DESYREL Take 1 tablet (50 mg total) by mouth at bedtime.   Vitamin D 50 MCG (2000 UT) tablet Take 2,000 Units by mouth daily.   Wixela Inhub 250-50 MCG/DOSE Aepb Generic drug: Fluticasone-Salmeterol Inhale 1 puff into the lungs 2 (two) times daily.      Allergies  Allergen Reactions  . Latex Other (See Comments)    Causes blisters  . Betamethasone Hives  . Dexamethasone Hives  . Tape Other (See Comments)    Caused blisters   . Tylenol [Acetaminophen] Other (See Comments)    Doesn't work for patient   . Remicade [Infliximab] Other (See Comments)    She went to rehab because of it  . Claritin [Loratadine] Other (See Comments)    Doesn't work for patient    The results of significant diagnostics from this hospitalization (including imaging, microbiology, ancillary and laboratory) are listed below for reference.    Significant  Diagnostic Studies: Dg Chest 2 View  Result Date: 05/11/2019 CLINICAL DATA:  Pt states she has not taken her BP, cholesterol meds in 2 weeks. pt here from home. Pt is having a COPD exacerbation. Pt has not been compliant with medications. This is not the first time per family. Pt states she doesn't take her meds because this is no way of living. Pt is unable to walk up stairs. Family is requesting psych consult and social work. No chest pain, cough , nor fever per pt, Hx of HTN. EXAM: CHEST - 2 VIEW COMPARISON:  None. FINDINGS: Cardiac silhouette is normal in size. No mediastinal or hilar masses. No evidence of adenopathy. Lungs are hyperexpanded. There are prominent bronchovascular markings with mild interstitial thickening most evident in bases. No evidence of pneumonia or pulmonary edema. No pleural effusion or pneumothorax. Skeletal structures are demineralized but grossly intact. IMPRESSION: 1. No acute cardiopulmonary disease. Electronically Signed   By: Lajean Manes M.D.   On: 05/11/2019 17:11   Dg Chest Port 1 View  Result Date: 05/15/2019 CLINICAL DATA:  Wheezing EXAM: PORTABLE CHEST 1 VIEW COMPARISON:  05/11/2019 FINDINGS: Cardiac shadow is stable. Aortic calcifications are again seen. Mild basilar atelectasis is noted. No bony abnormality is noted. IMPRESSION: Bibasilar atelectatic changes. Electronically Signed   By: Inez Catalina M.D.   On: 05/15/2019 08:00    Microbiology: No results found for this or any previous visit (from the past 240 hour(s)).   Labs: Basic Metabolic Panel: Recent Labs  Lab 05/21/19 0659  NA 135  K 4.2  CL 102  CO2 22  GLUCOSE 128*  BUN 14  CREATININE 1.08*  CALCIUM 8.8*   Liver Function Tests: No results for input(s): AST, ALT, ALKPHOS, BILITOT, PROT, ALBUMIN in the last 168 hours. No results for input(s): LIPASE, AMYLASE in the last 168 hours. No results for input(s): AMMONIA in the last 168 hours. CBC: No results for input(s): WBC, NEUTROABS,  HGB, HCT, MCV, PLT in the last 168 hours. Cardiac Enzymes: No results for input(s): CKTOTAL, CKMB, CKMBINDEX, TROPONINI in the last 168 hours. BNP: BNP (last 3 results) No results for input(s): BNP in the last 8760 hours.  ProBNP (last 3 results) No results for input(s): PROBNP in the last 8760 hours.  CBG: No results for input(s): GLUCAP in the last 168 hours.  Principal Problem:  MDD (major depressive disorder), recurrent episode, moderate (HCC) Active Problems:   Hypokalemia   QT prolongation   UTI (urinary tract infection)   Suicidal ideation   Venous stasis   MDD (major depressive disorder), recurrent severe, without psychosis (HCC)   Time coordinating discharge: 38 minutes.  Signed:        Thong Feeny, DO [facility]  05/26/2019, 1:43 PM

## 2019-05-26 NOTE — Progress Notes (Signed)
Given referral by nurses to see Kristina May.  She is leaving today, daughter is coming to ger her, and is in a great mood. Engaged in conversation with her and nurse for about 10 minutes.

## 2019-05-26 NOTE — TOC Initial Note (Addendum)
Transition of Care [facility]) - Initial/Assessment Note    Patient Details  Name: Kristina May MRN: [id number] Date of Birth: 1936-07-11  Transition of [facility]) CM/SW Contact:    Gildardo Griffes, LCSW Phone Number: 05/26/2019, 10:02 AM  Clinical Narrative:                  CSW consulted with patient's daughter regarding discharge plan. She is in agreement with Home Health services being set up with no preference of agency. She reports patient will be going to [facility] on 8/30.   CSW consulted with Benjaman Kindler with [facility] who reports [facility] will be able to transfer services from patient's daughter's home in [facility] to [place] on 8/30.   CSW has set up patient with Home Health PT and OT with [facility]. Services will be transferred to [facility] for continued Home Health services on 8/30.   No further needs at this time. Arline Asp reports she will pick patient up at discharge, CSW will inform her when patient's discharge paperwork is completed.   Expected Discharge Plan: Home w Home Health Services Barriers to Discharge: No Barriers Identified   Patient Goals and CMS Choice   CMS Medicare.gov Compare Post Acute Care list provided to:: Patient Represenative (must comment)(daughter Arline Asp) Choice offered to / list presented to : Adult Children  Expected Discharge Plan and Services Expected Discharge Plan: Home w Home Health Services     Post Acute Care Choice: Home Health Living arrangements for the past 2 months: Single Family Home Expected Discharge Date: 05/26/19                         HH Arranged: OT, PT HH Agency: [facility] (formerly [facility]) Date HH Agency Contacted: 05/26/19 Time HH Agency Contacted: 1000 Representative spoke with at [facility] Agency: Tiffany  Prior Living Arrangements/Services Living arrangements for the past 2 months: Single Family Home Lives with:: Adult Children Patient language and need for  interpreter reviewed:: Yes Do you feel safe going back to the place where you live?: Yes      Need for Family Participation in Patient Care: Yes (Comment) Care giver support system in place?: Yes (comment)   Criminal Activity/Legal Involvement Pertinent to Current Situation/Hospitalization: No - Comment as needed  Activities of Daily Living Home Assistive Devices/Equipment: Wheelchair, Hearing aid ADL Screening (condition at time of admission) Patient's cognitive ability adequate to safely complete daily activities?: Yes Is the patient deaf or have difficulty hearing?: Yes Does the patient have difficulty seeing, even when wearing glasses/contacts?: No Does the patient have difficulty concentrating, remembering, or making decisions?: No Patient able to express need for assistance with ADLs?: Yes Does the patient have difficulty dressing or bathing?: Yes Independently performs ADLs?: No Communication: Independent Dressing (OT): Needs assistance Is this a change from baseline?: Pre-admission baseline Grooming: Needs assistance Is this a change from baseline?: Pre-admission baseline Feeding: Independent Bathing: Needs assistance Is this a change from baseline?: Pre-admission baseline Toileting: Needs assistance Is this a change from baseline?: Pre-admission baseline In/Out Bed: Needs assistance Is this a change from baseline?: Pre-admission baseline Walks in Home: Independent with device (comment)(wheelchair.) Does the patient have difficulty walking or climbing stairs?: Yes Weakness of Legs: Both Weakness of Arms/Hands: Both  Permission Sought/Granted Permission sought to share information with : Case Manager, Magazine features editor, Family Supports    Share Information with NAME: Arline Asp  Permission granted to share info  w AGENCY: [place] granted to share info w Relationship: daughter  Permission granted to share info w Contact Information:  [phone]  Emotional Assessment Appearance:: Appears stated age Attitude/Demeanor/Rapport: Unable to Assess Affect (typically observed): Unable to Assess Orientation: : Oriented to Self, Oriented to Place, Oriented to  Time Alcohol / Substance Use: Not Applicable Psych Involvement: No (comment)  Admission diagnosis:  Wheezing [R06.2] Suicidal ideation [R45.851] Hypokalemia [E87.6] Patient Active Problem List   Diagnosis Date Noted  . MDD (major depressive disorder), recurrent episode, moderate ([place])   . Hypokalemia 05/13/2019  . QT prolongation 05/13/2019  . UTI (urinary tract infection) 05/13/2019  . Suicidal ideation 05/13/2019  . Venous stasis 05/13/2019  . MDD (major depressive disorder), recurrent severe, without psychosis ([place])   . Right knee pain 10/12/2013  . Arthritis pain, hand 10/12/2013  . Unspecified essential hypertension 10/12/2013  . Unspecified hypothyroidism 10/12/2013  . Dyslipidemia 10/12/2013  . Rheumatoid arthritis(714.0)   . Polyarthritis 10/11/2013   PCP:  Tomasa Hose, NP Pharmacy:   [id number] - [place], [address] [place] [address] Phone: [phone] Fax: [phone]     Social Determinants of Health (SDOH) Interventions    Readmission Risk Interventions No flowsheet data found.

## 2019-07-03 DIAGNOSIS — N1831 Chronic kidney disease, stage 3a: Secondary | ICD-10-CM | POA: Insufficient documentation

## 2020-05-09 ENCOUNTER — Emergency Department (HOSPITAL_COMMUNITY): Payer: Medicare Other

## 2020-05-09 ENCOUNTER — Emergency Department (HOSPITAL_COMMUNITY)
Admission: EM | Admit: 2020-05-09 | Discharge: 2020-05-09 | Disposition: A | Discharge status: Home / Self Care | Payer: Medicare Other | Attending: Emergency Medicine | Admitting: Emergency Medicine

## 2020-05-09 ENCOUNTER — Other Ambulatory Visit: Payer: Self-pay

## 2020-05-09 ENCOUNTER — Encounter (HOSPITAL_COMMUNITY): Payer: Self-pay

## 2020-05-09 DIAGNOSIS — J449 Chronic obstructive pulmonary disease, unspecified: Secondary | ICD-10-CM | POA: Insufficient documentation

## 2020-05-09 DIAGNOSIS — I10 Essential (primary) hypertension: Secondary | ICD-10-CM | POA: Diagnosis not present

## 2020-05-09 DIAGNOSIS — E039 Hypothyroidism, unspecified: Secondary | ICD-10-CM | POA: Diagnosis not present

## 2020-05-09 DIAGNOSIS — Z87891 Personal history of nicotine dependence: Secondary | ICD-10-CM | POA: Diagnosis not present

## 2020-05-09 DIAGNOSIS — R21 Rash and other nonspecific skin eruption: Secondary | ICD-10-CM | POA: Insufficient documentation

## 2020-05-09 DIAGNOSIS — B029 Zoster without complications: Secondary | ICD-10-CM | POA: Diagnosis not present

## 2020-05-09 DIAGNOSIS — Z91040 Latex allergy status: Secondary | ICD-10-CM | POA: Insufficient documentation

## 2020-05-09 DIAGNOSIS — M25559 Pain in unspecified hip: Secondary | ICD-10-CM

## 2020-05-09 HISTORY — DX: Unspecified atrial fibrillation: I48.91

## 2020-05-09 HISTORY — DX: Other pulmonary embolism without acute cor pulmonale: I26.99

## 2020-05-09 HISTORY — DX: Zoster without complications: B02.9

## 2020-05-09 HISTORY — DX: Acute pancreatitis without necrosis or infection, unspecified: K85.90

## 2020-05-09 MED ORDER — OXYCODONE-ACETAMINOPHEN 5-325 MG PO TABS
1.0000 | ORAL_TABLET | Freq: Once | ORAL | Status: AC
  Administered 2020-05-09: 1 via ORAL
  Filled 2020-05-09: qty 1

## 2020-05-09 MED ORDER — PREDNISONE 10 MG PO TABS: ORAL_TABLET | ORAL | 0 refills | Status: AC

## 2020-05-09 MED ORDER — ACYCLOVIR 400 MG PO TABS: 800.0000 mg | ORAL_TABLET | Freq: Every day | ORAL | 0 refills | Status: AC

## 2020-05-09 NOTE — Discharge Instructions (Signed)
Please take the prednisone as prescribed and the acyclovir as prescribed.  Please follow-up with your primary care doctor.  Please return emergency department if you develop any worsening left pain with fevers, redness or swelling. Please drink plenty of water.

## 2020-05-09 NOTE — ED Triage Notes (Signed)
Pt from home via ems; c/o shingles to L thigh, hip x 2 weeks; also c/o lower back pain, chronic; past 3 days pain has been worse; takes aleve at home for the back pain  182/100 HR 62 RR 18 97% RA

## 2020-05-09 NOTE — ED Provider Notes (Signed)
[facility] EMERGENCY DEPARTMENT Provider Note   CSN: [id number] Arrival date & time: 05/09/20  1504     History Chief Complaint  Patient presents with  . Herpes Zoster    Kristina May is a 84 y.o. female.  HPI  Patient is an 84 year old female with a history of's morbid obesity, A. fib on Xarelto, COPD, hyperlipidemia, HTN, PE, shingles, thyroid disease on levothyroxine.  Patient is presented today with 3 days of left hip pain and several years of low back pain.  Kristina May is also complaining of a rash to her left lateral thigh.  Kristina May states that it feels exactly like the shingles that Kristina May had on her scalp over a year ago.  Kristina May notes a little fluid-filled vesicles 2 weeks ago.  Kristina May states it is burning and uncomfortable since that time. Kristina May says it is located with little time, burning quality, moderate to severe at times severity pain.   Kristina May denies any steroid use, HIV, AIDS, other immunosuppressive disease.      Past Medical History:  Diagnosis Date  . A-fib (HCC)   . COPD (chronic obstructive pulmonary disease) (HCC)   . Dyslipidemia   . Hypertension   . Pancreatitis   . PE (pulmonary thromboembolism) (HCC)   . Rheumatoid arthritis(714.0)   . Shingles   . Thyroid disease     Patient Active Problem List   Diagnosis Date Noted  . MDD (major depressive disorder), recurrent episode, moderate (HCC)   . Hypokalemia 05/13/2019  . QT prolongation 05/13/2019  . UTI (urinary tract infection) 05/13/2019  . Suicidal ideation 05/13/2019  . Venous stasis 05/13/2019  . MDD (major depressive disorder), recurrent severe, without psychosis (HCC)   . Right knee pain 10/12/2013  . Arthritis pain, hand 10/12/2013  . Unspecified essential hypertension 10/12/2013  . Unspecified hypothyroidism 10/12/2013  . Dyslipidemia 10/12/2013  . Rheumatoid arthritis(714.0)   . Polyarthritis 10/11/2013    Past Surgical History:  Procedure Laterality Date  . APPENDECTOMY    .  FOOT SURGERY       OB History   No obstetric history on file.     Family History  Problem Relation Age of Onset  . Multiple sclerosis Other     Social History   Tobacco Use  . Smoking status: Former Games developer  . Smokeless tobacco: Never Used  Substance Use Topics  . Alcohol use: Yes    Comment: Occ  . Drug use: No    Home Medications Prior to Admission medications   Medication Sig Start Date End Date Taking? Authorizing Provider  acidophilus (RISAQUAD) CAPS capsule Take 1 capsule by mouth daily. 05/26/19   Swayze, Ava, DO  acyclovir (ZOVIRAX) 400 MG tablet Take 2 tablets (800 mg total) by mouth 5 (five) times daily for 7 days. 05/09/20 05/16/20  Gailen Shelter, PA  apixaban (ELIQUIS) 5 MG TABS tablet Take 5 mg by mouth 2 (two) times daily.    [provider]  aspirin EC 81 MG tablet Take 81 mg by mouth daily.    [provider]  Cholecalciferol (VITAMIN D) 2000 UNITS tablet Take 2,000 Units by mouth daily.    [provider]  fexofenadine (ALLEGRA) 180 MG tablet Take 180 mg by mouth daily.    [provider]  Fluticasone-Salmeterol (WIXELA INHUB) 250-50 MCG/DOSE AEPB Inhale 1 puff into the lungs 2 (two) times daily.    [provider]  folic acid (FOLVITE) 1 MG tablet Take 1 mg by mouth daily.  [provider]  furosemide (LASIX) 40 MG tablet Take 1 tablet (40 mg total) by mouth 2 (two) times daily. 05/26/19   Swayze, Ava, DO  guaiFENesin (MUCINEX) 600 MG 12 hr tablet Take by mouth 2 (two) times daily.    [provider]  ipratropium-albuterol (DUONEB) 0.5-2.5 (3) MG/3ML SOLN Take 3 mLs by nebulization every 6 (six) hours as needed (wheezing).     [provider]  leflunomide (ARAVA) 10 MG tablet Take 10 mg by mouth daily.     [provider]  levothyroxine (SYNTHROID) 150 MCG tablet Take 150 mcg by mouth daily before breakfast.     [provider]  magnesium chloride (SLOW-MAG) 64 MG TBEC SR  tablet Take 1 tablet (64 mg total) by mouth daily. 05/26/19   Swayze, Ava, DO  metoprolol succinate (TOPROL-XL) 50 MG 24 hr tablet Take 50 mg by mouth 2 (two) times daily. Take with or immediately following a meal.    [provider]  naproxen sodium (ALEVE) 220 MG tablet Take 220 mg by mouth daily as needed (pain).    [provider]  nystatin (MYCOSTATIN/NYSTOP) powder Apply topically 3 (three) times daily. 05/26/19   Swayze, Ava, DO  potassium chloride SA (K-DUR) 20 MEQ tablet Take 2 tablets (40 mEq total) by mouth 2 (two) times daily. 05/26/19   Swayze, Ava, DO  pravastatin (PRAVACHOL) 40 MG tablet Take 40 mg by mouth at bedtime.    [provider]  predniSONE (DELTASONE) 10 MG tablet Take 4 tablets (40 mg total) by mouth daily for 2 days, THEN 2 tablets (20 mg total) daily for 2 days. 05/09/20 05/13/20  Gailen Shelter, PA  predniSONE (DELTASONE) 5 MG tablet Take 5 mg by mouth 2 (two) times daily with a meal.    [provider]  tiotropium (SPIRIVA) 18 MCG inhalation capsule Place 18 mcg into inhaler and inhale daily.    [provider]  traZODone (DESYREL) 50 MG tablet Take 1 tablet (50 mg total) by mouth at bedtime. 05/26/19   Swayze, Ava, DO    Allergies    Latex, Betamethasone, Dexamethasone, Tape, Tylenol [acetaminophen], Remicade [infliximab], and Claritin [loratadine]  Review of Systems   Review of Systems  Constitutional: Negative for fever.  HENT: Negative for congestion.   Respiratory: Negative for shortness of breath.   Cardiovascular: Negative for chest pain.  Gastrointestinal: Negative for abdominal distention.  Musculoskeletal:       Left hip pain, chronic left back pain  Skin: Positive for rash.  Neurological: Negative for dizziness and headaches.    Physical Exam Updated Vital Signs BP (!) 158/82   Pulse 64   Temp 99.1 F (37.3 C) (Oral)   Resp 18   Ht 5\' 5"  (1.651 m)   Wt 117.9 kg   SpO2 96%   BMI 43.27 kg/m    Physical Exam Vitals and nursing note reviewed.  Constitutional:      General: Kristina May is not in acute distress.    Appearance: Normal appearance. Kristina May is obese. Kristina May is not ill-appearing.     Comments: Pleasant 84 year old female appears stated age.  HENT:     Head: Normocephalic and atraumatic.     Mouth/Throat:     Mouth: Mucous membranes are moist.  Eyes:     General: No scleral icterus.       Right eye: No discharge.        Left eye: No discharge.     Conjunctiva/sclera: Conjunctivae normal.  Cardiovascular:  Rate and Rhythm: Normal rate and regular rhythm.  Pulmonary:     Effort: Pulmonary effort is normal.     Breath sounds: No stridor.  Musculoskeletal:     Right lower leg: Edema present.     Left lower leg: Edema present.     Comments: Chronic bilateral lower extremity edema. Nonpitting.  Full range of motion of left hip, knee and lower extremity. Strength is 5/5 in bilateral lower extremities. Ambulatory at baseline.  Skin:    General: Skin is warm and dry.     Comments: Scattered multiple vesicular lesions to the left lateral thigh. Consistent with zoster rash.  Neurological:     Mental Status: Kristina May is alert and oriented to person, place, and time. Mental status is at baseline.         ED Results / Procedures / Treatments   Labs (all labs ordered are listed, but only abnormal results are displayed) Labs Reviewed - No data to display  EKG None  Radiology DG HIP UNILAT WITH PELVIS 2-3 VIEWS LEFT  Result Date: 05/09/2020 CLINICAL DATA:  Pain. EXAM: DG HIP (WITH OR WITHOUT PELVIS) 2-3V LEFT COMPARISON:  No comparison studies available. FINDINGS: Frontal pelvis with AP view of the left hip shows the patient to be status post left total hip replacement. Degenerative changes noted right hip. Bones are diffusely demineralized. SI joints and symphysis pubis unremarkable. IMPRESSION: Status post left total hip replacement.  No acute findings. Electronically Signed    By: Kennith Center M.D.   On: 05/09/2020 16:42   DG HIP UNILAT WITH PELVIS 2-3 VIEWS RIGHT  Result Date: 05/09/2020 CLINICAL DATA:  Pain. EXAM: DG HIP (WITH OR WITHOUT PELVIS) 2-3V RIGHT COMPARISON:  None. FINDINGS: Two view exam of the right hip shows loss of joint space with subchondral sclerosis and degenerative spurring. No acute bony abnormality. No worrisome lytic or sclerotic osseous lesion. IMPRESSION: Right hip osteoarthritis. Electronically Signed   By: Kennith Center M.D.   On: 05/09/2020 16:42    Procedures Procedures (including critical care time)  Medications Ordered in ED Medications  oxyCODONE-acetaminophen (PERCOCET/ROXICET) 5-325 MG per tablet 1 tablet (1 tablet Oral Given 05/09/20 1606)    ED Course  I have reviewed the triage vital signs and the nursing notes.  Pertinent labs & imaging results that were available during my care of the patient were reviewed by me and considered in my medical decision making (see chart for details).  Patient is 84 year old female presented today with complaints of zoster rash on her left lateral thigh for 2 weeks has current vesicular rash on my examination. Will benefit from treatment of acyclovir and prednisone. Patient also is presenting for left hip pain. Kristina May also has chronic low back pain. Physical range of motion of her left hip. I did obtain x-rays to rule out any acute abnormality effusion. Kristina May does not have any warmth or tenderness there. Kristina May does have bilateral lower extremity edema which Kristina May states is unchanged.  Clinical Course as of May 10 1936  [facility] May 09, 2020  1646 Left hip is status post hip replacement.  Right hip with chronic appearing osteoarthritis.  No acute fractures or dislocations.  Agree with radiology read.   [WF]    Clinical Course User Index [WF] Gailen Shelter, [place]   I discussed this case with my attending physician who cosigned this note including patient's presenting symptoms, physical exam, and planned  diagnostics and interventions. Attending physician stated agreement with plan or made changes  to plan which were implemented.   Attending physician assessed patient at bedside.  Plan is to discharge patient with prednisone and acyclovir. Kristina May will follow-up with orthopedic doctor to reassess her hip pain and back pain and will follow up with PCP to recheck her after her zoster treatment.  MDM Rules/Calculators/A&P                          Additional permission obtained by daughter. Daughter and patient are agreeable plan discharge at this time.   Final Clinical Impression(s) / ED Diagnoses Final diagnoses:  Herpes zoster without complication    Rx / DC Orders ED Discharge Orders         Ordered    predniSONE (DELTASONE) 10 MG tablet     Discontinue  Reprint     05/09/20 1721    acyclovir (ZOVIRAX) 400 MG tablet  5 times daily     Discontinue  Reprint     05/09/20 1721           Solon Augusta [place], [place] 05/10/20 1942    Arby Barrette, MD 05/19/20 [phone]

## 2020-05-09 NOTE — ED Provider Notes (Signed)
Medical screening examination/treatment/procedure(s) were conducted as a shared visit with non-physician practitioner(s) and myself.  I personally evaluated the patient during the encounter.    Patient reports she is having increased pain from her lower back to the left hip.  She does have a history of hip replacement on the left done over 5 years ago.  No falls or injuries.  She has had aching pain as well as burning pain over the lateral thigh down to the knee.  She has developed fluid-filled blisters on the lateral leg that are burning in quality.  She reports this is consistent with both having had shingles previously.  She is concerned because the pain in her hip and low back seem more of an aching and different pain perhaps not associated with the shingles.  No fevers, no chills.  Patient is chronically on Xarelto.  Alert and nontoxic.  Well in appearance.  Mental status clear. heart regular.  Lungs with occasional basilar crackle but good airflow no wheezing.  Patient's pain is slightly reproducible in the very lower aspect of the SI joint on the left and across the lateral hip.  There are no skin changes in this area no erythema or rash directly over the hip or back.  Patient does have vesicular intact rash on the lateral left hip to above the knee.  Patient has 2+ edema bilateral ankles and feet.  She reports this is chronic in nature.  X-rays do not show acute findings.  At this time I have low suspicion for a septic hip.  Patient reports her strength in the left is good but she is pain limited with her walking.  At this time will opt to treat with Percocet for pain control which has a quality highly suggestive of sciatica.  Patient however also has an active zoster rash.  This has been developing reportedly for couple weeks.  The patient however has intact vesicles that appear more recent.  With combination of hip pain and rash, will opt to treat with a small burst of prednisone (she takes daily  prednisone for COPD) and antiviral.  Return precautions reviewed.   Arby Barrette, MD 05/09/20 1729

## 2020-09-13 DIAGNOSIS — R002 Palpitations: Secondary | ICD-10-CM | POA: Insufficient documentation

## 2020-12-09 ENCOUNTER — Emergency Department (HOSPITAL_COMMUNITY): Payer: Medicare Other

## 2020-12-09 ENCOUNTER — Inpatient Hospital Stay (HOSPITAL_COMMUNITY)
Admission: EM | Admit: 2020-12-09 | Discharge: 2020-12-12 | DRG: 438 | Disposition: A | Discharge status: Home / Self Care | Payer: Medicare Other | Attending: Family Medicine | Admitting: Family Medicine

## 2020-12-09 ENCOUNTER — Other Ambulatory Visit: Payer: Self-pay

## 2020-12-09 DIAGNOSIS — Z7982 Long term (current) use of aspirin: Secondary | ICD-10-CM

## 2020-12-09 DIAGNOSIS — K7581 Nonalcoholic steatohepatitis (NASH): Secondary | ICD-10-CM | POA: Diagnosis present

## 2020-12-09 DIAGNOSIS — I110 Hypertensive heart disease with heart failure: Secondary | ICD-10-CM | POA: Diagnosis present

## 2020-12-09 DIAGNOSIS — J449 Chronic obstructive pulmonary disease, unspecified: Secondary | ICD-10-CM | POA: Diagnosis present

## 2020-12-09 DIAGNOSIS — Z79890 Hormone replacement therapy (postmenopausal): Secondary | ICD-10-CM

## 2020-12-09 DIAGNOSIS — Z66 Do not resuscitate: Secondary | ICD-10-CM | POA: Diagnosis present

## 2020-12-09 DIAGNOSIS — Z87891 Personal history of nicotine dependence: Secondary | ICD-10-CM

## 2020-12-09 DIAGNOSIS — R1084 Generalized abdominal pain: Secondary | ICD-10-CM

## 2020-12-09 DIAGNOSIS — U071 COVID-19: Secondary | ICD-10-CM

## 2020-12-09 DIAGNOSIS — R079 Chest pain, unspecified: Secondary | ICD-10-CM

## 2020-12-09 DIAGNOSIS — Z91040 Latex allergy status: Secondary | ICD-10-CM

## 2020-12-09 DIAGNOSIS — Z79899 Other long term (current) drug therapy: Secondary | ICD-10-CM

## 2020-12-09 DIAGNOSIS — Z888 Allergy status to other drugs, medicaments and biological substances status: Secondary | ICD-10-CM

## 2020-12-09 DIAGNOSIS — K859 Acute pancreatitis without necrosis or infection, unspecified: Principal | ICD-10-CM | POA: Diagnosis present

## 2020-12-09 DIAGNOSIS — Z91048 Other nonmedicinal substance allergy status: Secondary | ICD-10-CM

## 2020-12-09 DIAGNOSIS — E785 Hyperlipidemia, unspecified: Secondary | ICD-10-CM | POA: Diagnosis present

## 2020-12-09 DIAGNOSIS — Z7901 Long term (current) use of anticoagulants: Secondary | ICD-10-CM

## 2020-12-09 DIAGNOSIS — R932 Abnormal findings on diagnostic imaging of liver and biliary tract: Secondary | ICD-10-CM

## 2020-12-09 DIAGNOSIS — Z7952 Long term (current) use of systemic steroids: Secondary | ICD-10-CM

## 2020-12-09 DIAGNOSIS — Z6841 Body Mass Index (BMI) 40.0 and over, adult: Secondary | ICD-10-CM

## 2020-12-09 DIAGNOSIS — E860 Dehydration: Secondary | ICD-10-CM | POA: Diagnosis present

## 2020-12-09 DIAGNOSIS — M069 Rheumatoid arthritis, unspecified: Secondary | ICD-10-CM | POA: Diagnosis present

## 2020-12-09 DIAGNOSIS — I2782 Chronic pulmonary embolism: Secondary | ICD-10-CM | POA: Diagnosis present

## 2020-12-09 DIAGNOSIS — E039 Hypothyroidism, unspecified: Secondary | ICD-10-CM | POA: Diagnosis present

## 2020-12-09 DIAGNOSIS — Z820 Family history of epilepsy and other diseases of the nervous system: Secondary | ICD-10-CM

## 2020-12-09 DIAGNOSIS — I5032 Chronic diastolic (congestive) heart failure: Secondary | ICD-10-CM | POA: Diagnosis present

## 2020-12-09 DIAGNOSIS — K7460 Unspecified cirrhosis of liver: Secondary | ICD-10-CM | POA: Diagnosis present

## 2020-12-09 DIAGNOSIS — I878 Other specified disorders of veins: Secondary | ICD-10-CM | POA: Diagnosis present

## 2020-12-09 DIAGNOSIS — I480 Paroxysmal atrial fibrillation: Secondary | ICD-10-CM | POA: Diagnosis present

## 2020-12-09 LAB — CBC WITH DIFFERENTIAL/PLATELET
Abs Immature Granulocytes: 0.18 10*3/uL — ABNORMAL HIGH (ref 0.00–0.07)
Basophils Absolute: 0.1 10*3/uL (ref 0.0–0.1)
Basophils Relative: 0 %
Eosinophils Absolute: 0.2 10*3/uL (ref 0.0–0.5)
Eosinophils Relative: 1 %
HCT: 48 % — ABNORMAL HIGH (ref 36.0–46.0)
Hemoglobin: 15.4 g/dL — ABNORMAL HIGH (ref 12.0–15.0)
Immature Granulocytes: 1 %
Lymphocytes Relative: 7 %
Lymphs Abs: 1 10*3/uL (ref 0.7–4.0)
MCH: 29.6 pg (ref 26.0–34.0)
MCHC: 32.1 g/dL (ref 30.0–36.0)
MCV: 92.1 fL (ref 80.0–100.0)
Monocytes Absolute: 0.8 10*3/uL (ref 0.1–1.0)
Monocytes Relative: 6 %
Neutro Abs: 11.7 10*3/uL — ABNORMAL HIGH (ref 1.7–7.7)
Neutrophils Relative %: 85 %
Platelets: 238 10*3/uL (ref 150–400)
RBC: 5.21 MIL/uL — ABNORMAL HIGH (ref 3.87–5.11)
RDW: 13.8 % (ref 11.5–15.5)
WBC: 13.9 10*3/uL — ABNORMAL HIGH (ref 4.0–10.5)
nRBC: 0 % (ref 0.0–0.2)

## 2020-12-09 LAB — URINALYSIS, ROUTINE W REFLEX MICROSCOPIC
Bacteria, UA: NONE SEEN
Bilirubin Urine: NEGATIVE
Glucose, UA: NEGATIVE mg/dL
Hgb urine dipstick: NEGATIVE
Ketones, ur: 5 mg/dL — AB
Leukocytes,Ua: NEGATIVE
Nitrite: NEGATIVE
Protein, ur: 100 mg/dL — AB
Specific Gravity, Urine: >1.046 — ABNORMAL HIGH (ref 1.005–1.030)
pH: 5 (ref 5.0–8.0)

## 2020-12-09 LAB — COMPREHENSIVE METABOLIC PANEL
ALT: 27 U/L (ref 0–44)
AST: 16 U/L (ref 15–41)
Albumin: 3.1 g/dL — ABNORMAL LOW (ref 3.5–5.0)
Alkaline Phosphatase: 109 U/L (ref 38–126)
Anion gap: 10 (ref 5–15)
BUN: 11 mg/dL (ref 8–23)
CO2: 23 mmol/L (ref 22–32)
Calcium: 9 mg/dL (ref 8.9–10.3)
Chloride: 103 mmol/L (ref 98–111)
Creatinine, Ser: 0.93 mg/dL (ref 0.44–1.00)
GFR, Estimated: >60 mL/min (ref >60)
Glucose, Bld: 137 mg/dL — ABNORMAL HIGH (ref 70–99)
Potassium: 3.8 mmol/L (ref 3.5–5.1)
Sodium: 136 mmol/L (ref 135–145)
Total Bilirubin: 1 mg/dL (ref 0.3–1.2)
Total Protein: 5.8 g/dL — ABNORMAL LOW (ref 6.5–8.1)

## 2020-12-09 LAB — RESP PANEL BY RT-PCR (FLU A&B, COVID) ARPGX2
Influenza A by PCR: NEGATIVE
Influenza B by PCR: NEGATIVE
SARS Coronavirus 2 by RT PCR: POSITIVE — AB

## 2020-12-09 LAB — BRAIN NATRIURETIC PEPTIDE: B Natriuretic Peptide: 146.9 pg/mL — ABNORMAL HIGH (ref 0.0–100.0)

## 2020-12-09 LAB — LIPASE, BLOOD: Lipase: 42 U/L (ref 11–51)

## 2020-12-09 LAB — TROPONIN I (HIGH SENSITIVITY)
Troponin I (High Sensitivity): 20 ng/L — ABNORMAL HIGH (ref <18)
Troponin I (High Sensitivity): 18 ng/L — ABNORMAL HIGH (ref <18)

## 2020-12-09 LAB — LACTIC ACID, PLASMA: Lactic Acid, Venous: 1.9 mmol/L (ref 0.5–1.9)

## 2020-12-09 MED ORDER — SODIUM CHLORIDE 0.9 % IV SOLN: INTRAVENOUS | Status: DC

## 2020-12-09 MED ORDER — HYDROMORPHONE HCL 1 MG/ML IJ SOLN
0.5000 mg | INTRAMUSCULAR | Status: DC | PRN
  Administered 2020-12-10 (×2): 0.5 mg via INTRAVENOUS
  Filled 2020-12-09 (×2): qty 0.5

## 2020-12-09 MED ORDER — UMECLIDINIUM BROMIDE 62.5 MCG/INH IN AEPB
1.0000 | INHALATION_SPRAY | Freq: Every morning | RESPIRATORY_TRACT | Status: DC
  Administered 2020-12-10 – 2020-12-12 (×3): 1 via RESPIRATORY_TRACT
  Filled 2020-12-09: qty 7

## 2020-12-09 MED ORDER — LEFLUNOMIDE 20 MG PO TABS
10.0000 mg | ORAL_TABLET | Freq: Every morning | ORAL | Status: DC
  Administered 2020-12-10 – 2020-12-12 (×3): 10 mg via ORAL
  Filled 2020-12-09 (×4): qty 0.5

## 2020-12-09 MED ORDER — FOLIC ACID 1 MG PO TABS
1.0000 mg | ORAL_TABLET | Freq: Every day | ORAL | Status: DC
  Administered 2020-12-09 – 2020-12-12 (×4): 1 mg via ORAL
  Filled 2020-12-09 (×4): qty 1

## 2020-12-09 MED ORDER — HYDROMORPHONE HCL 1 MG/ML IJ SOLN
1.0000 mg | Freq: Once | INTRAMUSCULAR | Status: AC
  Administered 2020-12-09: 1 mg via INTRAVENOUS
  Filled 2020-12-09: qty 1

## 2020-12-09 MED ORDER — TRAZODONE HCL 50 MG PO TABS: 50.0000 mg | ORAL_TABLET | Freq: Every day | ORAL | Status: DC

## 2020-12-09 MED ORDER — APIXABAN 5 MG PO TABS
5.0000 mg | ORAL_TABLET | Freq: Two times a day (BID) | ORAL | Status: DC
  Administered 2020-12-09 – 2020-12-12 (×6): 5 mg via ORAL
  Filled 2020-12-09 (×6): qty 1

## 2020-12-09 MED ORDER — ENOXAPARIN SODIUM 40 MG/0.4ML ~~LOC~~ SOLN: 40.0000 mg | SUBCUTANEOUS | Status: DC

## 2020-12-09 MED ORDER — PRAVASTATIN SODIUM 40 MG PO TABS
40.0000 mg | ORAL_TABLET | Freq: Every day | ORAL | Status: DC
  Administered 2020-12-09 – 2020-12-11 (×3): 40 mg via ORAL
  Filled 2020-12-09 (×3): qty 1

## 2020-12-09 MED ORDER — METOPROLOL SUCCINATE ER 50 MG PO TB24
50.0000 mg | ORAL_TABLET | Freq: Two times a day (BID) | ORAL | Status: DC
  Administered 2020-12-09 – 2020-12-12 (×6): 50 mg via ORAL
  Filled 2020-12-09 (×6): qty 1

## 2020-12-09 MED ORDER — IOHEXOL 350 MG/ML SOLN
100.0000 mL | Freq: Once | INTRAVENOUS | Status: AC | PRN
  Administered 2020-12-09: 100 mL via INTRAVENOUS

## 2020-12-09 MED ORDER — IPRATROPIUM-ALBUTEROL 0.5-2.5 (3) MG/3ML IN SOLN: 3.0000 mL | Freq: Four times a day (QID) | RESPIRATORY_TRACT | Status: DC | PRN

## 2020-12-09 MED ORDER — PREDNISONE 20 MG PO TABS
20.0000 mg | ORAL_TABLET | Freq: Every day | ORAL | Status: DC
  Administered 2020-12-10 – 2020-12-12 (×3): 20 mg via ORAL
  Filled 2020-12-09 (×3): qty 1

## 2020-12-09 MED ORDER — ASPIRIN EC 81 MG PO TBEC
81.0000 mg | DELAYED_RELEASE_TABLET | Freq: Every morning | ORAL | Status: DC
  Administered 2020-12-10 – 2020-12-12 (×3): 81 mg via ORAL
  Filled 2020-12-09 (×3): qty 1

## 2020-12-09 MED ORDER — MOMETASONE FURO-FORMOTEROL FUM 200-5 MCG/ACT IN AERO
2.0000 | INHALATION_SPRAY | Freq: Two times a day (BID) | RESPIRATORY_TRACT | Status: DC
  Administered 2020-12-09 – 2020-12-12 (×6): 2 via RESPIRATORY_TRACT
  Filled 2020-12-09: qty 8.8

## 2020-12-09 MED ORDER — ALBUTEROL SULFATE HFA 108 (90 BASE) MCG/ACT IN AERS
2.0000 | INHALATION_SPRAY | Freq: Four times a day (QID) | RESPIRATORY_TRACT | Status: DC | PRN
  Administered 2020-12-09 – 2020-12-10 (×2): 2 via RESPIRATORY_TRACT
  Filled 2020-12-09 (×2): qty 6.7

## 2020-12-09 MED ORDER — FENTANYL CITRATE (PF) 100 MCG/2ML IJ SOLN
50.0000 ug | Freq: Once | INTRAMUSCULAR | Status: AC
  Administered 2020-12-09: 50 ug via INTRAVENOUS
  Filled 2020-12-09: qty 2

## 2020-12-09 MED ORDER — LEVOTHYROXINE SODIUM 75 MCG PO TABS
150.0000 ug | ORAL_TABLET | Freq: Every day | ORAL | Status: DC
  Administered 2020-12-10 – 2020-12-12 (×3): 150 ug via ORAL
  Filled 2020-12-09 (×3): qty 2

## 2020-12-09 MED ORDER — LOSARTAN POTASSIUM 25 MG PO TABS
12.5000 mg | ORAL_TABLET | Freq: Every morning | ORAL | Status: DC
  Administered 2020-12-10 – 2020-12-12 (×3): 12.5 mg via ORAL
  Filled 2020-12-09 (×4): qty 0.5

## 2020-12-09 NOTE — ED Triage Notes (Signed)
Pt arrives via [facility] stretcher c/o nausea, generalized pain. Pt has hx COPD and had wheezing for EMS. Given 10 mg albuterol, 0.5 mg atrovent with improvement.   EMS last VS 132/90, 94% on RA, HR 96  #18 L AC

## 2020-12-09 NOTE — ED Provider Notes (Signed)
5:10 PM Care of the patient assumed at signout. Now, CT scans are available, and I discussed them with her and her daughter. CT findings concerning for acute pancreatitis, but reassuring with no evidence for aortic lesions, or other acute findings.  On discussing this with the patient and her daughter, patient has had prior episodes of acute pancreatitis, and today's presentation feels similar. She has had negligible relief thus far with initial narcotics per Given abnormal CT, persistent pain, nausea, patient will require admission for ongoing fluids, analgesics.  Patient is Covid positive.  Patient currently has no respiratory complaints, but on further discussion it seems as though 2 weeks ago she had an episode of increased cough, increased work of breathing.  This was attributed to COPD, and the patient was not tested at that time. Patient has received her COVID vaccines and booster.   Gerhard Munch, MD 12/09/20 [phone]

## 2020-12-09 NOTE — Progress Notes (Signed)
Family Medicine Teaching Service Daily Progress Note Intern Pager: [phone]  Patient name: Kristina May Medical record number: [id number] Date of birth: 10/29/1935 Age: 85 y.o. Gender: female  Primary Care Provider: Jettie Pagan, NP Consultants: None Code Status: DNR  Pt Overview and Major Events to Date:  3/11 - Admitted  Assessment and Plan: Kristina May is a 85 y.o. female presenting with diffuse abdominal pain that radiates to the back, imaging shows pancreatitis. PMH is significant for HTN, hypothyroidism, dyslipidemia, RA, depression, venous stasis, prior PE on Eliquis, PAF, appendectomy, COPD (not on home oxygen).   Acute pancreatitis Vials signs remain stable and patient afebrile. Lipid panel shows TG 199, which is not elevated to the level expected that would indicate it might cause pancreatis.Likely idiopathic, consideration to curbside/consult GI for recommendations for further work-up as this is patient's second idiopathic appearing pancreatitis.  - Continuous cardiac monitoring - IVF NS @ 130mL/h - Clear liquid diet, advance as tolerated - Follow-up RUQ [place] - Dilaudid 0.5mg  q1h PRN, consider spacing further  Covid-19 infection Patient had shortness of breath 2 weeks ago attributed to COPD and given steroid taper, consideration that initial SoB could have been COVID presentation. No oxygen requirement, will not treat for COVID at this time. - Airborne and contact precautions - Continue steroid taper  x2 - Monitor respiratory status  HFpEF: chronic Lung exam with wheezing but no crackles that would attributed to fluid overload. Lower extremity edema not responsive to lasix, likely venous stasis vs lymphedema which patient states is very tender. - Holding home lasix  - Holding home Klorcon daily - Compression stockings - Strict I/O - Daily weights  HTN: chronic, stable BP since admission 70-107/120-178. - Continue home losartan  12.5mg   Hypothyroidism: chronic, stable TSH 1.355. - Continue home synthroid  Dyslipidemia: chronic, stable Lipid panel: cholesterol 121, HDL 40, LDL 41, TG 199. TG not extraordinarily elevated and not at the expected level to consider that it may have contributed to current pancreatitis. - Continue home pravastatin  Rheumatoid arthritis: chronic, stable - Continue home pred and Arava  Hx of AFIB  Hx of PE: chronic, stable RRR on on exam and telemetry at time of exam.  - Continue home Eliquis and Metoprolol - Continuous cardiac telemetry      FEN/GI: CLD, advance as tolerated PPx: Full anticoagulation with Eliquis   Status is: Observation  The patient remains OBS appropriate and will d/c before 2 midnights.  Dispo: The patient is from: Home              Anticipated d/c is to: Home              Patient currently is not medically stable to d/c.   Difficult to place patient No    Subjective:  Patient reports that she is starting to feel some abdominal pain coming back which she rates as a 7/10. She doesn't not feel like she needs the pain medications just yet and knows that she is to ask for them if she wants them.  Objective: Temp:  [98.2 F (36.8 C)-99.2 F (37.3 C)] 99.2 F (37.3 C) (03/12 0724) Pulse Rate:  [72-95] 72 (03/12 0724) Resp:  [16-29] 19 (03/12 0724) BP: (120-178)/(70-107) 168/84 (03/12 0724) SpO2:  [90 %-98 %] 93 % (03/12 0724) Weight:  [122.5 kg-122.7 kg] 122.7 kg (03/12 0500) Physical Exam: General -- oriented x3, pleasant and cooperative. HEENT -- Head is normocephalic. EOMI.  Neck -- supple; no bruits. Integument -- intact. No  rashes on exposed skin Chest -- wheezing diffusely, increased expiratory phase, appears comfortable on room air, no increased WOB Cardiac -- RRR. No murmurs noted.  Abdomen -- soft, diffusely tender with worsening pain in upper quadrants, hypoactive bowel sounds, no rebound tenderness or guarding present. CNS -- cranial  nerves II through XII grossly intact. 2+ reflexes bilaterally. Extremeties - non-pitting edema of b/l LE with tenderness during palpation, feet and lower legs cool to touch   Laboratory: Recent Labs  Lab 12/09/20 1322 12/10/20 0426  WBC 13.9* 11.8*  HGB 15.4* 14.4  HCT 48.0* 44.8  PLT 238 217   Recent Labs  Lab 12/09/20 1322 12/10/20 0426  NA 136 135  K 3.8 3.9  CL 103 101  CO2 23 24  BUN 11 10  CREATININE 0.93 0.99  CALCIUM 9.0 9.1  PROT 5.8*  --   BILITOT 1.0  --   ALKPHOS 109  --   ALT 27  --   AST 16  --   GLUCOSE 137* 169*     Imaging/Diagnostic Tests: DG Chest Portable 1 View  Result Date: 12/09/2020 CLINICAL DATA:  Chest pain, shortness of breath EXAM: PORTABLE CHEST 1 VIEW COMPARISON:  05/15/2019 FINDINGS: Underpenetrated examination. Cardiomegaly. No acute airspace opacity. The visualized skeletal structures are unremarkable. IMPRESSION: 1. Underpenetrated AP portable examination without acute abnormality of the lungs. 2. Cardiomegaly. Electronically Signed   By: Lauralyn Primes M.D.   On: 12/09/2020 14:24   CT Angio Chest/Abd/Pel for Dissection W and/or Wo Contrast  Result Date: 12/09/2020 CLINICAL DATA:  Chest pain and back pain. EXAM: CT ANGIOGRAPHY CHEST, ABDOMEN AND PELVIS TECHNIQUE: Non-contrast CT of the chest was initially obtained. Multidetector CT imaging through the chest, abdomen and pelvis was performed using the standard protocol during bolus administration of intravenous contrast. Multiplanar reconstructed images and MIPs were obtained and reviewed to evaluate the vascular anatomy. CONTRAST:  OMNIPAQUE IOHEXOL 350 MG/ML SOLN COMPARISON:  None. FINDINGS: CTA CHEST FINDINGS Cardiovascular: Aortic calcifications are noted without evidence for an aneurysm or dissection. The main pulmonary artery is dilated measuring 3.5 cm in diameter. Coronary artery calcifications are noted. There is no significant pericardial effusion. The heart size is mildly  enlarged. Mediastinum/Nodes: -- No mediastinal lymphadenopathy. -- No hilar lymphadenopathy. -- No axillary lymphadenopathy. -- No supraclavicular lymphadenopathy. --thyroid gland is not well visualized. -  Unremarkable esophagus. Lungs/Pleura: Scarring and atelectasis is noted at the lung bases. There is no pneumothorax. The trachea is unremarkable. Musculoskeletal: No chest wall abnormality. No bony spinal canal stenosis. Review of the MIP images confirms the above findings. CTA ABDOMEN AND PELVIS FINDINGS VASCULAR Aorta: Normal caliber aorta without aneurysm, dissection, vasculitis or significant stenosis. Celiac: There is moderate narrowing at the origin of the celiac axis. SMA: Patent without evidence of aneurysm, dissection, vasculitis or significant stenosis. Renals: Both renal arteries are patent without evidence of aneurysm, dissection, vasculitis, fibromuscular dysplasia or significant stenosis. IMA: Patent without evidence of aneurysm, dissection, vasculitis or significant stenosis. Inflow: Patent without evidence of aneurysm, dissection, vasculitis or significant stenosis. Veins: No obvious venous abnormality within the limitations of this arterial phase study. Review of the MIP images confirms the above findings. NON-VASCULAR Hepatobiliary: There is decreased hepatic attenuation suggestive of hepatic steatosis. The liver surface is nodular. Normal gallbladder.There is no biliary ductal dilation. Pancreas: There is peripancreatic fat stranding, especially bout the pancreatic body and tail. The pancreas appears to be uniformly enhancing. There is no well-formed drainable collection. Spleen: Unremarkable. Adrenals/Urinary Tract: --Adrenal glands: Unremarkable. --  Right kidney/ureter: No hydronephrosis or radiopaque kidney stones. --Left kidney/ureter: No hydronephrosis or radiopaque kidney stones. --Urinary bladder: Unremarkable. Stomach/Bowel: --Stomach/Duodenum: No hiatal hernia or other gastric  abnormality. Normal duodenal course and caliber. --Small bowel: Unremarkable. --Colon: Rectosigmoid diverticulosis without acute inflammation. --Appendix: Surgically absent. Vascular/Lymphatic: Atherosclerotic calcification is present within the non-aneurysmal abdominal aorta, without hemodynamically significant stenosis. --No retroperitoneal lymphadenopathy. --No mesenteric lymphadenopathy. --No pelvic or inguinal lymphadenopathy. Reproductive: Unremarkable Other: No ascites or free air. The abdominal wall is normal. Musculoskeletal. No acute displaced fractures. Review of the MIP images confirms the above findings. IMPRESSION: 1. No evidence of aortic dissection or aneurysm. 2. Peripancreatic fat stranding consistent with acute pancreatitis. No well-formed drainable collection. 3. Hepatic steatosis with findings suspicious for underlying cirrhosis. 4. Dilated main pulmonary artery which can be seen in patients with elevated pulmonary artery pressures. 5. Rectosigmoid diverticulosis without acute inflammation. Aortic Atherosclerosis (ICD10-I70.0). Electronically Signed   By: Katherine Mantle M.D.   On: 12/09/2020 16:00     Evelena Leyden, DO 12/10/2020, 8:28 AM [id number], [facility] FPTS Intern pager: [phone], text pages welcome

## 2020-12-09 NOTE — H&P (Addendum)
Family Medicine Teaching [facility] Admission History and Physical Service Pager: [phone]  Patient name: Kristina May Medical record number: [id number] Date of birth: [birth year] Age: 85 y.o. Gender: female  Primary Care Provider: Jettie Pagan, NP Consultants: None Code Status: DNR Preferred Emergency Contact: Kristina May, daughter  Chief Complaint: Abdominal pain, shortness of breath  Assessment and Plan: Kristina May is a 85 y.o. female presenting with diffuse abdominal pain that radiates to the back, imaging shows pancreatitis. PMH is significant for HTN, hypothyroidism, dyslipidemia, RA, depression, venous stasis, prior PE on Eliquis, PAF, appendectomy, COPD (not on home oxygen).   Acute Pancreatitis  Patient presents with diffuse abdominal pain that radiates to the back and sides x 2 days.  Vitals stable, patient afebrile. ED work up notable for WBC 13.9 with left shift. Albumin decreased at 3.1, likely in the setting of acute inflammation. Troponin and CTA obtained to rule out dissection given pain presentation. Trop flat at 20>18. CTA with evidence of acute pancreatitis. Interestingly, her Lipase is normal at 42. Patient has history of previous pancreatitis 6 years ago and etiology was not identified at that time.  LA normal. UA with evidence of dehydration given SG >1.046, 5 ketones and 100 protein. Patient rarely drinks alcohol (1 day on her birthday). Still has her gallbladder but CT without evidence of stone, LFTs WNL making obstruction less likely. Other etiologies could be medication vs. Autoimmune vs viral etiology. Patient did test positive for COVID-19 infection, question if a recent infection could have contributed to this acute onset. Patient does have signs of nodular liver, read as c/w steatosis on CT, but due to pancreatitis and abnormal finding will obtain RUQ [place]. -Admit to med-tele, attending Dr. Pollie May -Vitals per floor protocol  -Cardiac  monitoring -IVF with NS at 125 mL/hr  -Pain control with 0.5 mg q1h PRN moderate to severe pain  -Lipid panel -RUQ US -DVT ppx- patient on home apixaban (see below) -ADAT can offer CLD -Up with assistance  COVID-19 Infection Quad screen positive for COVID-19. Appears patient was seen in clinic 2 weeks ago for shortness of breath that was attributed to her COPD. She was not tested for COVID at that time. Question if this is acute COVID infection or residual symptoms from recent infection. Will need to keep all precautions in place as the timeline is unclear. As patient has no oxygen requirement at this time, will not proceed with COVID treatment, but will continue steroid taper from home (see below). -Airborne and contact precautions  Leukocytosis: acute WBCs to 13.9 with left shift, ANC 11.7. Patient is chronically on prednisone  BID for RA, but recently has been on prednisone 40 mg for the last 4 days due to presumed COPD exacerbation, now testing positive for COVID. In the absence of fever and positive viral test, will continue with prednisone taper but suspect mild leukocytosis is due to demargination 2/2 prednisone. -Repeat CBC in AM  COPD: chronic Patient uses albuterol nebulizer, recently ran out and only has MDI, no spacer. Recently believed she had an exacerbation, had been placed on prednisone 40 mg x4 days, now on 20 mg. Examination notable for wheezing in b/l lower bases. Without oxygen requirement. Home medications include Duoneb, incruse ellipta, advair, fexofenadine, albuterol.  -Airborne and contact precautions -Continue steroid taper  x3d  HFpEF: chronic Last Echo 2019 with normal LV function, Grade 1 DD. BNP slightly elevated at 146.9. Does not appear fluid overloaded on examination, UA with signs of dehydration including ketones, has  not had more than 1 glass of orange juice with water x2 days. Has chronic SOB with appreciable wheezing on examination, likely from COPD.  No crackles. On Lasix for lower extremity edema though it is not responsive to Lasix and I suspect it is from lymphedema or venous stasis. -Hold home Lasix -Hold home Klorcon 20 mEq daily -Recommend compression stockings -Strict I/O -Daily weights  HTN: chronic, stable HTN up to 170's systolic in ED. Home medication notable for Losartan 12.5 mg daily. Patient takes medication as prescribed. States that her home blood pressures range 120 systolic and usually 70's diastolic. -Continue home Losartan 12.5 mg  -Vitals per floor  Hypothyroidism: chronic On Synthroid 150 mcg daily before breakfast.  -Continue Synthroid daily  -TSH in AM  Dyslipidemia: Chronic, stable On Pravastatin 40 mg at bedtime.  -Repeat lipid panel -Continue Pravastatin  Rheumatoid Arthritis: chronic, stable  -Continue home Pred 5mg  BID -Continue home Arava 20 mg daily  Hx A.fib  Pulmonary Embolism: chronic, stable Regular rhythm and rate on examination. Patient with chronic and symmetric non-pitting edema in b/l legs concerning for lymphedema vs venous stasis, low suspicion for DVT. Wheezing appreciated on examination. Without oxygen requirement or tachycardia. SOB likely due to COPD, less concern for PE (also on Eliquis) but will need to consider further workup if breathing worsens.   -Continue Eliquis -Continue Metoprolol -Telemetry   Lx Latent TB 2014 Treated. Repeat quant negative.  FEN/GI: CLD, advance as able  Prophylaxis: Eliquis   Disposition: Med-tele  History of Present Illness:  Kristina May is a 85 y.o. female presenting with diffuse abdominal pain that goes to the back and flank. She had nausea, dry heaving, no vomiting, and abdominal pain on Wednesday night into Thursday. She continued to feel poorly with pain from chest to abdomen, going to back and flanks, presently 8/10. She discussed this with her PCP, who recommended presenting to ED. Patient has only had 1 cup of orange juice with water  for the last 2 days. She recently started prednisone 40 mg x4 days for trouble breathing (h/o COPD), now on 20 mg daily. Patient took two doses of aleve for pain, which minimally relieved pain. Patient had 1 other episode of pancreatitis 6 years ago, idiopathic. Patient drinks EtOH scotch sour once a year on her birthday, no other alcohol. She had became symptomatic with coughing and more phlegm than normal about 2 weeks ago, trouble breathing, using her nebulizer. She has since had improved symptoms. She does not use O2 at baseline. She does sleep in a recliner, reports that she cannot lay flat to sleep (for approx 7 years). She tested positive for COVID-19 today, she is fully vaccinated and boosted. She uses albuterol inhaler, she does not have a spacer. She prefers the nebulizer, reports not getting same relief from MDI. Patient is a former smoker, 1ppd x 60 years= 60 pack years.   In ED: Normal saline 125 mL/h, received fentanyl 50 mcg x 1, Dilaudid 1 mg x 1.  EKG showed sinus tachycardia to 102 BPM, PVCs and bigeminy, no ST elevation.  Chest x-ray without acute lung abnormality, cardiomegaly appreciated.  CTA without evidence of aortic dissection or aneurysm. Did show peripancreatic fat stranding consistent with acute pancreatitis and without well-formed drainable collection. FPTS paged for unsassigned admission.   Review Of Systems: Per HPI with the following additions:   Review of Systems  Constitutional: Negative for appetite change, chills, fatigue and fever.  HENT: Negative for sinus pressure, sinus pain, sneezing  and sore throat.   Respiratory: Positive for cough. Negative for shortness of breath.   Cardiovascular: Positive for leg swelling. Negative for chest pain and palpitations.       Leg swelling, neuropathy for many years  Gastrointestinal: Positive for abdominal distention, abdominal pain, nausea and vomiting. Negative for constipation and diarrhea.  Genitourinary: Negative for dysuria  and urgency.  Neurological: Negative for dizziness, speech difficulty, light-headedness and headaches.  All other systems reviewed and are negative.    Patient Active Problem List   Diagnosis Date Noted  . MDD (major depressive disorder), recurrent episode, moderate (HCC)   . Hypokalemia 05/13/2019  . QT prolongation 05/13/2019  . UTI (urinary tract infection) 05/13/2019  . Suicidal ideation 05/13/2019  . Venous stasis 05/13/2019  . MDD (major depressive disorder), recurrent severe, without psychosis (HCC)   . Right knee pain 10/12/2013  . Arthritis pain, hand 10/12/2013  . Unspecified essential hypertension 10/12/2013  . Unspecified hypothyroidism 10/12/2013  . Dyslipidemia 10/12/2013  . Rheumatoid arthritis(714.0)   . Polyarthritis 10/11/2013    Past Medical History: Past Medical History:  Diagnosis Date  . A-fib (HCC)   . COPD (chronic obstructive pulmonary disease) (HCC)   . Dyslipidemia   . Hypertension   . Pancreatitis   . PE (pulmonary thromboembolism) (HCC)   . Rheumatoid arthritis(714.0)   . Shingles   . Thyroid disease     Past Surgical History: Past Surgical History:  Procedure Laterality Date  . APPENDECTOMY    . FOOT SURGERY      Social History: Social History   Tobacco Use  . Smoking status: Former Games developer  . Smokeless tobacco: Never Used  Substance Use Topics  . Alcohol use: Yes    Comment: Occ  . Drug use: No   Additional social history: patient lives in an apartment by herself, uses a walker to get around, her daughter lives nearby and visits her multiple times a week and brings her groceries. Please also refer to relevant sections of EMR.  Family History: Family History  Problem Relation Age of Onset  . Multiple sclerosis Other     Allergies and Medications: Allergies  Allergen Reactions  . Latex Other (See Comments)    Causes blisters  . Betamethasone Hives  . Dexamethasone Hives  . Tape Other (See Comments)    Caused blisters    . Tylenol [Acetaminophen] Other (See Comments)    Doesn't work for patient   . Remicade [Infliximab] Other (See Comments)    She went to rehab because of it  . Claritin [Loratadine] Other (See Comments)    Doesn't work for patient   No current facility-administered medications on file prior to encounter.   Current Outpatient Medications on File Prior to Encounter  Medication Sig Dispense Refill  . acidophilus (RISAQUAD) CAPS capsule Take 1 capsule by mouth daily. 30 capsule 0  . apixaban (ELIQUIS) 5 MG TABS tablet Take 5 mg by mouth 2 (two) times daily.    Marland Kitchen aspirin EC 81 MG tablet Take 81 mg by mouth daily.    . Cholecalciferol (VITAMIN D) 2000 UNITS tablet Take 2,000 Units by mouth daily.    . fexofenadine (ALLEGRA) 180 MG tablet Take 180 mg by mouth daily.    . Fluticasone-Salmeterol (WIXELA INHUB) 250-50 MCG/DOSE AEPB Inhale 1 puff into the lungs 2 (two) times daily.    . folic acid (FOLVITE) 1 MG tablet Take 1 mg by mouth daily.    . furosemide (LASIX) 40 MG tablet Take 1  tablet (40 mg total) by mouth 2 (two) times daily. 30 tablet 0  . guaiFENesin (MUCINEX) 600 MG 12 hr tablet Take by mouth 2 (two) times daily.    Marland Kitchen ipratropium-albuterol (DUONEB) 0.5-2.5 (3) MG/3ML SOLN Take 3 mLs by nebulization every 6 (six) hours as needed (wheezing).     Marland Kitchen leflunomide (ARAVA) 10 MG tablet Take 10 mg by mouth daily.     Marland Kitchen levothyroxine (SYNTHROID) 150 MCG tablet Take 150 mcg by mouth daily before breakfast.     . magnesium chloride (SLOW-MAG) 64 MG TBEC SR tablet Take 1 tablet (64 mg total) by mouth daily. 60 tablet 0  . metoprolol succinate (TOPROL-XL) 50 MG 24 hr tablet Take 50 mg by mouth 2 (two) times daily. Take with or immediately following a meal.    . naproxen sodium (ALEVE) 220 MG tablet Take 220 mg by mouth daily as needed (pain).    . nystatin (MYCOSTATIN/NYSTOP) powder Apply topically 3 (three) times daily. 15 g 0  . potassium chloride SA (K-DUR) 20 MEQ tablet Take 2 tablets (40 mEq  total) by mouth 2 (two) times daily. 60 tablet 0  . pravastatin (PRAVACHOL) 40 MG tablet Take 40 mg by mouth at bedtime.    . predniSONE (DELTASONE) 5 MG tablet Take 5 mg by mouth 2 (two) times daily with a meal.    . tiotropium (SPIRIVA) 18 MCG inhalation capsule Place 18 mcg into inhaler and inhale daily.    . traZODone (DESYREL) 50 MG tablet Take 1 tablet (50 mg total) by mouth at bedtime. 30 tablet 0    Objective: BP (!) 146/93   Pulse 88   Temp 98.6 F (37 C) (Oral)   Resp (!) 26   Ht 5\' 6"  (1.676 m)   Wt 122.5 kg   SpO2 95%   BMI 43.58 kg/m  Exam: General: Awake, alert, in no distress, pleasant, joking Eyes: Sclera non-icteric Neck: Supple, cervical spine non-tender Cardiovascular: RRR without murmur, 2+ radial and DP pulses b/l  Respiratory: Wheezing in b/l bases, increased expiratory phase (about 3x inspiratory phase)  Gastrointestinal: diffusely tender in all quadrants, hypoactive bowel sounds, no rebound or guarding, no hepatosplenomegaly, negative Murphy sign.  MSK: No obvious bony deformities, able to move all extremities spontaneously   Derm: lower extremities cool to touch b/l, non-pitting edema to b/l lower extremities Neuro: Answers questions appropriately, alert and oriented, speech is clear, no focal deficits Psych: Normal mood and affect  Labs and Imaging: CBC BMET  Recent Labs  Lab 12/09/20 1322  WBC 13.9*  HGB 15.4*  HCT 48.0*  PLT 238   Recent Labs  Lab 12/09/20 1322  NA 136  K 3.8  CL 103  CO2 23  BUN 11  CREATININE 0.93  GLUCOSE 137*  CALCIUM 9.0     EKG: Sinus tachycardia 102 bpm, multiple PVC's, bigeminy   DG Chest Portable 1 View  Result Date: 12/09/2020 CLINICAL DATA:  Chest pain, shortness of breath EXAM: PORTABLE CHEST 1 VIEW COMPARISON:  05/15/2019 FINDINGS: Underpenetrated examination. Cardiomegaly. No acute airspace opacity. The visualized skeletal structures are unremarkable. IMPRESSION: 1. Underpenetrated AP portable  examination without acute abnormality of the lungs. 2. Cardiomegaly. Electronically Signed   By: 05/17/2019 M.D.   On: 12/09/2020 14:24   CT Angio Chest/Abd/Pel for Dissection W and/or Wo Contrast  Result Date: 12/09/2020 CLINICAL DATA:  Chest pain and back pain. EXAM: CT ANGIOGRAPHY CHEST, ABDOMEN AND PELVIS TECHNIQUE: Non-contrast CT of the chest was initially obtained. Multidetector  CT imaging through the chest, abdomen and pelvis was performed using the standard protocol during bolus administration of intravenous contrast. Multiplanar reconstructed images and MIPs were obtained and reviewed to evaluate the vascular anatomy. CONTRAST:  OMNIPAQUE IOHEXOL 350 MG/ML SOLN COMPARISON:  None. FINDINGS: CTA CHEST FINDINGS Cardiovascular: Aortic calcifications are noted without evidence for an aneurysm or dissection. The main pulmonary artery is dilated measuring 3.5 cm in diameter. Coronary artery calcifications are noted. There is no significant pericardial effusion. The heart size is mildly enlarged. Mediastinum/Nodes: -- No mediastinal lymphadenopathy. -- No hilar lymphadenopathy. -- No axillary lymphadenopathy. -- No supraclavicular lymphadenopathy. --thyroid gland is not well visualized. -  Unremarkable esophagus. Lungs/Pleura: Scarring and atelectasis is noted at the lung bases. There is no pneumothorax. The trachea is unremarkable. Musculoskeletal: No chest wall abnormality. No bony spinal canal stenosis. Review of the MIP images confirms the above findings. CTA ABDOMEN AND PELVIS FINDINGS VASCULAR Aorta: Normal caliber aorta without aneurysm, dissection, vasculitis or significant stenosis. Celiac: There is moderate narrowing at the origin of the celiac axis. SMA: Patent without evidence of aneurysm, dissection, vasculitis or significant stenosis. Renals: Both renal arteries are patent without evidence of aneurysm, dissection, vasculitis, fibromuscular dysplasia or significant stenosis. IMA: Patent  without evidence of aneurysm, dissection, vasculitis or significant stenosis. Inflow: Patent without evidence of aneurysm, dissection, vasculitis or significant stenosis. Veins: No obvious venous abnormality within the limitations of this arterial phase study. Review of the MIP images confirms the above findings. NON-VASCULAR Hepatobiliary: There is decreased hepatic attenuation suggestive of hepatic steatosis. The liver surface is nodular. Normal gallbladder.There is no biliary ductal dilation. Pancreas: There is peripancreatic fat stranding, especially bout the pancreatic body and tail. The pancreas appears to be uniformly enhancing. There is no well-formed drainable collection. Spleen: Unremarkable. Adrenals/Urinary Tract: --Adrenal glands: Unremarkable. --Right kidney/ureter: No hydronephrosis or radiopaque kidney stones. --Left kidney/ureter: No hydronephrosis or radiopaque kidney stones. --Urinary bladder: Unremarkable. Stomach/Bowel: --Stomach/Duodenum: No hiatal hernia or other gastric abnormality. Normal duodenal course and caliber. --Small bowel: Unremarkable. --Colon: Rectosigmoid diverticulosis without acute inflammation. --Appendix: Surgically absent. Vascular/Lymphatic: Atherosclerotic calcification is present within the non-aneurysmal abdominal aorta, without hemodynamically significant stenosis. --No retroperitoneal lymphadenopathy. --No mesenteric lymphadenopathy. --No pelvic or inguinal lymphadenopathy. Reproductive: Unremarkable Other: No ascites or free air. The abdominal wall is normal. Musculoskeletal. No acute displaced fractures. Review of the MIP images confirms the above findings. IMPRESSION: 1. No evidence of aortic dissection or aneurysm. 2. Peripancreatic fat stranding consistent with acute pancreatitis. No well-formed drainable collection. 3. Hepatic steatosis with findings suspicious for underlying cirrhosis. 4. Dilated main pulmonary artery which can be seen in patients with elevated  pulmonary artery pressures. 5. Rectosigmoid diverticulosis without acute inflammation. Aortic Atherosclerosis (ICD10-I70.0). Electronically Signed   By: Katherine Mantle M.D.   On: 12/09/2020 16:00     Sabino Dick, DO 12/09/2020, 5:38 PM PGY-1, [facility] FPTS Intern pager: [phone], text pages welcome  FPTS Upper-Level Resident Addendum   I have independently interviewed and examined the patient. I have discussed the above with the original author and agree with their documentation. My edits for correction/addition/clarification are in pink. Please see also any attending notes.   Shirlean Mylar, M.D. PGY-2, [facility] 12/09/2020 8:45 PM  FPTS Service pager: [phone] (text pages welcome through [facility])

## 2020-12-09 NOTE — ED Notes (Signed)
Attempted to call report x 1  

## 2020-12-09 NOTE — ED Provider Notes (Signed)
[facility] EMERGENCY DEPARTMENT Provider Note   CSN: [id number] Arrival date & time: 12/09/20  1309     History No chief complaint on file.   Kristina May is a 85 y.o. female.  The history is provided by the patient, the EMS personnel and medical records. No language interpreter was used.  Chest Pain Pain location:  Substernal area Pain quality: aching, crushing and pressure   Pain radiates to:  Upper back, mid back and epigastrium Pain severity:  Severe Onset quality:  Sudden Duration:  3 days Timing:  Constant Progression:  Unchanged Chronicity:  New Worsened by:  Nothing Ineffective treatments:  None tried Associated symptoms: abdominal pain, back pain, cough, fatigue, lower extremity edema (at baseline), nausea, shortness of breath and vomiting   Associated symptoms: no diaphoresis, no dizziness, no fever, no headache, no numbness, no palpitations and no weakness   Risk factors: not female        Past Medical History:  Diagnosis Date  . A-fib (HCC)   . COPD (chronic obstructive pulmonary disease) (HCC)   . Dyslipidemia   . Hypertension   . Pancreatitis   . PE (pulmonary thromboembolism) (HCC)   . Rheumatoid arthritis(714.0)   . Shingles   . Thyroid disease     Patient Active Problem List   Diagnosis Date Noted  . MDD (major depressive disorder), recurrent episode, moderate (HCC)   . Hypokalemia 05/13/2019  . QT prolongation 05/13/2019  . UTI (urinary tract infection) 05/13/2019  . Suicidal ideation 05/13/2019  . Venous stasis 05/13/2019  . MDD (major depressive disorder), recurrent severe, without psychosis (HCC)   . Right knee pain 10/12/2013  . Arthritis pain, hand 10/12/2013  . Unspecified essential hypertension 10/12/2013  . Unspecified hypothyroidism 10/12/2013  . Dyslipidemia 10/12/2013  . Rheumatoid arthritis(714.0)   . Polyarthritis 10/11/2013    Past Surgical History:  Procedure Laterality Date  . APPENDECTOMY    .  FOOT SURGERY       OB History   No obstetric history on file.     Family History  Problem Relation Age of Onset  . Multiple sclerosis Other     Social History   Tobacco Use  . Smoking status: Former Games developer  . Smokeless tobacco: Never Used  Substance Use Topics  . Alcohol use: Yes    Comment: Occ  . Drug use: No    Home Medications Prior to Admission medications   Medication Sig Start Date End Date Taking? Authorizing Provider  acidophilus (RISAQUAD) CAPS capsule Take 1 capsule by mouth daily. 05/26/19   Swayze, Ava, DO  apixaban (ELIQUIS) 5 MG TABS tablet Take 5 mg by mouth 2 (two) times daily.    [provider]  aspirin EC 81 MG tablet Take 81 mg by mouth daily.    [provider]  Cholecalciferol (VITAMIN D) 2000 UNITS tablet Take 2,000 Units by mouth daily.    [provider]  fexofenadine (ALLEGRA) 180 MG tablet Take 180 mg by mouth daily.    [provider]  Fluticasone-Salmeterol (WIXELA INHUB) 250-50 MCG/DOSE AEPB Inhale 1 puff into the lungs 2 (two) times daily.    [provider]  folic acid (FOLVITE) 1 MG tablet Take 1 mg by mouth daily.    [provider]  furosemide (LASIX) 40 MG tablet Take 1 tablet (40 mg total) by mouth 2 (two) times daily. 05/26/19   Swayze, Ava, DO  guaiFENesin (MUCINEX) 600 MG 12 hr tablet Take by mouth 2 (two)  times daily.    [provider]  ipratropium-albuterol (DUONEB) 0.5-2.5 (3) MG/3ML SOLN Take 3 mLs by nebulization every 6 (six) hours as needed (wheezing).     [provider]  leflunomide (ARAVA) 10 MG tablet Take 10 mg by mouth daily.     [provider]  levothyroxine (SYNTHROID) 150 MCG tablet Take 150 mcg by mouth daily before breakfast.     [provider]  magnesium chloride (SLOW-MAG) 64 MG TBEC SR tablet Take 1 tablet (64 mg total) by mouth daily. 05/26/19   Swayze, Ava, DO  metoprolol succinate (TOPROL-XL) 50 MG 24 hr tablet Take 50 mg by  mouth 2 (two) times daily. Take with or immediately following a meal.    [provider]  naproxen sodium (ALEVE) 220 MG tablet Take 220 mg by mouth daily as needed (pain).    [provider]  nystatin (MYCOSTATIN/NYSTOP) powder Apply topically 3 (three) times daily. 05/26/19   Swayze, Ava, DO  potassium chloride SA (K-DUR) 20 MEQ tablet Take 2 tablets (40 mEq total) by mouth 2 (two) times daily. 05/26/19   Swayze, Ava, DO  pravastatin (PRAVACHOL) 40 MG tablet Take 40 mg by mouth at bedtime.    [provider]  predniSONE (DELTASONE) 5 MG tablet Take 5 mg by mouth 2 (two) times daily with a meal.    [provider]  tiotropium (SPIRIVA) 18 MCG inhalation capsule Place 18 mcg into inhaler and inhale daily.    [provider]  traZODone (DESYREL) 50 MG tablet Take 1 tablet (50 mg total) by mouth at bedtime. 05/26/19   Swayze, Ava, DO    Allergies    Latex, Betamethasone, Dexamethasone, Tape, Tylenol [acetaminophen], Remicade [infliximab], and Claritin [loratadine]  Review of Systems   Review of Systems  Constitutional: Positive for fatigue. Negative for chills, diaphoresis and fever.  HENT: Negative for congestion.   Respiratory: Positive for cough, chest tightness, shortness of breath and wheezing.   Cardiovascular: Positive for chest pain and leg swelling. Negative for palpitations.  Gastrointestinal: Positive for abdominal pain, nausea and vomiting. Negative for constipation and diarrhea.  Genitourinary: Negative for dysuria and flank pain.  Musculoskeletal: Positive for back pain.  Neurological: Negative for dizziness, weakness, light-headedness, numbness and headaches.  Psychiatric/Behavioral: Negative for agitation.  All other systems reviewed and are negative.   Physical Exam Updated Vital Signs BP 120/87   Pulse 86   Temp 98.6 F (37 C) (Oral)   Resp (!) 21   Ht 5\' 6"  (1.676 m)   Wt 122.5 kg   SpO2 92%   BMI 43.58 kg/m   Physical  Exam Vitals and nursing note reviewed.  Constitutional:      General: She is not in acute distress.    Appearance: She is not ill-appearing, toxic-appearing or diaphoretic.  HENT:     Head: Normocephalic.     Mouth/Throat:     Mouth: Mucous membranes are moist.  Eyes:     Extraocular Movements: Extraocular movements intact.  Cardiovascular:     Rate and Rhythm: Normal rate.     Heart sounds: No murmur heard.   Pulmonary:     Effort: Pulmonary effort is normal.     Breath sounds: Rhonchi present. No wheezing or rales.  Chest:     Chest wall: No tenderness.  Abdominal:     General: Abdomen is flat. Bowel sounds are normal. There is no distension.     Tenderness: There is generalized abdominal tenderness. There is no  right CVA tenderness, left CVA tenderness, guarding or rebound.  Neurological:     General: No focal deficit present.     Mental Status: She is alert.  Psychiatric:        Mood and Affect: Mood normal.     ED Results / Procedures / Treatments   Labs (all labs ordered are listed, but only abnormal results are displayed) Labs Reviewed - No data to display  EKG EKG Interpretation  Date/Time:  Friday December 09 2020 13:17:10 EST Ventricular Rate:  102 PR Interval:    QRS Duration: 87 QT Interval:  361 QTC Calculation: 459 R Axis:   -44 Text Interpretation: Sinus tachycardia Ventricular bigeminy Left anterior fascicular block Abnormal R-wave progression, early transition LVH with secondary repolarization abnormality When compared to prior, miltiple PVC and bigeminy. No STEMI Confirmed by Theda Belfast ([id number]) on 12/09/2020 1:38:16 PM   Radiology DG Chest Portable 1 View  Result Date: 12/09/2020 CLINICAL DATA:  Chest pain, shortness of breath EXAM: PORTABLE CHEST 1 VIEW COMPARISON:  05/15/2019 FINDINGS: Underpenetrated examination. Cardiomegaly. No acute airspace opacity. The visualized skeletal structures are unremarkable. IMPRESSION: 1. Underpenetrated AP  portable examination without acute abnormality of the lungs. 2. Cardiomegaly. Electronically Signed   By: Lauralyn Primes M.D.   On: 12/09/2020 14:24    Procedures Procedures   Medications Ordered in ED Medications  fentaNYL (SUBLIMAZE) injection 50 mcg (50 mcg Intravenous Given 12/09/20 1337)    ED Course  I have reviewed the triage vital signs and the nursing notes.  Pertinent labs & imaging results that were available during my care of the patient were reviewed by me and considered in my medical decision making (see chart for details).    MDM Rules/Calculators/A&P                          Jalaysia Lobb is a 85 y.o. female with a past medical history significant for hypertension, hypothyroidism, dyslipidemia, rheumatoid arthritis, depression, venous stasis, prior PE on Eliquis, prior A. fib, prior appendectomy, and COPD not on home oxygen who presents with 3 days of severe torso pain and some shortness of breath.  Patient reports that 3 days ago, patient had sudden onset of 10 out of 10 pain in her chest going straight to her back as well as her abdomen going to her back.  She has never had this before.  She reports it is a pain that is "deep inside" and is an aching and pressure sensation.  She reports she has had some dry heaving and nausea but denies vomiting.  She reports normal bowel movements with no constipation or diarrhea.  She denies urinary changes.  She reports her legs are always edematous and swollen but that does not seem different to her.  She reports that she was having more shortness of breath today and EMS was called for her.  She was given albuterol and Atrovent in the truck in route and her breathing has somewhat improved.  She is maintaining oxygen saturation on room air on arrival.  She reports chronic cough that is no different than baseline.  She denies any trauma.  She reports that she has been worked up as an outpatient and will schedule get a CT scan to further  evaluate her torso pain.  On exam, patient's lungs have some wheezing but there is no significant rhonchi or rales.  No murmur.  Chest and abdomen are tender on palpation primarily abdomen.  She  does have intact DP pulses bilaterally.  There is some edema in both legs.  Otherwise resting comfortably now.  She denies any neurologic deficits.  Given the patient's report of severe chest pain, abdominal pain and pain going to her back that was sudden onset, I am concerned about an aortic etiology.  We will get dissection study to further evaluate.  We will get screening labs.  Patient appears to have intolerance to some steroids we will hold on steroids for the wheezing as her wheezing is reportedly improving.  We will get a chest x-ray however and get screening labs including troponin.  We will get a Covid test as anticipate patient may require admission after work-up.  Anticipate reassessment after work-up.  Care transferred to oncoming team awaiting for results of diagnostic work-up.  Anticipate reassessment to determine disposition.    Final Clinical Impression(s) / ED Diagnoses Final diagnoses:  Chest pain, unspecified type  Generalized abdominal pain     Clinical Impression: 1. Chest pain, unspecified type   2. Generalized abdominal pain     Disposition: Care transferred to oncoming team awaiting for results of diagnostic work-up.  Anticipate reassessment to determine disposition.  This note was prepared with assistance of Conservation officer, historic buildings. Occasional wrong-word or sound-a-like substitutions may have occurred due to the inherent limitations of voice recognition software.      Nedra Mcinnis, Canary Brim, MD 12/09/20 1531

## 2020-12-10 ENCOUNTER — Observation Stay (HOSPITAL_COMMUNITY): Payer: Medicare Other

## 2020-12-10 ENCOUNTER — Encounter (HOSPITAL_COMMUNITY): Payer: Self-pay | Admitting: Family Medicine

## 2020-12-10 DIAGNOSIS — J449 Chronic obstructive pulmonary disease, unspecified: Secondary | ICD-10-CM | POA: Diagnosis present

## 2020-12-10 DIAGNOSIS — Z87891 Personal history of nicotine dependence: Secondary | ICD-10-CM | POA: Diagnosis not present

## 2020-12-10 DIAGNOSIS — E039 Hypothyroidism, unspecified: Secondary | ICD-10-CM | POA: Diagnosis present

## 2020-12-10 DIAGNOSIS — Z888 Allergy status to other drugs, medicaments and biological substances status: Secondary | ICD-10-CM | POA: Diagnosis not present

## 2020-12-10 DIAGNOSIS — Z66 Do not resuscitate: Secondary | ICD-10-CM | POA: Diagnosis present

## 2020-12-10 DIAGNOSIS — I480 Paroxysmal atrial fibrillation: Secondary | ICD-10-CM | POA: Diagnosis present

## 2020-12-10 DIAGNOSIS — K7460 Unspecified cirrhosis of liver: Secondary | ICD-10-CM | POA: Diagnosis present

## 2020-12-10 DIAGNOSIS — Z6841 Body Mass Index (BMI) 40.0 and over, adult: Secondary | ICD-10-CM | POA: Diagnosis not present

## 2020-12-10 DIAGNOSIS — I2782 Chronic pulmonary embolism: Secondary | ICD-10-CM | POA: Diagnosis present

## 2020-12-10 DIAGNOSIS — I110 Hypertensive heart disease with heart failure: Secondary | ICD-10-CM | POA: Diagnosis present

## 2020-12-10 DIAGNOSIS — E860 Dehydration: Secondary | ICD-10-CM | POA: Diagnosis present

## 2020-12-10 DIAGNOSIS — Z7901 Long term (current) use of anticoagulants: Secondary | ICD-10-CM | POA: Diagnosis not present

## 2020-12-10 DIAGNOSIS — Z820 Family history of epilepsy and other diseases of the nervous system: Secondary | ICD-10-CM | POA: Diagnosis not present

## 2020-12-10 DIAGNOSIS — Z91040 Latex allergy status: Secondary | ICD-10-CM | POA: Diagnosis not present

## 2020-12-10 DIAGNOSIS — E785 Hyperlipidemia, unspecified: Secondary | ICD-10-CM | POA: Diagnosis present

## 2020-12-10 DIAGNOSIS — Z79890 Hormone replacement therapy (postmenopausal): Secondary | ICD-10-CM | POA: Diagnosis not present

## 2020-12-10 DIAGNOSIS — M069 Rheumatoid arthritis, unspecified: Secondary | ICD-10-CM | POA: Diagnosis present

## 2020-12-10 DIAGNOSIS — K859 Acute pancreatitis without necrosis or infection, unspecified: Principal | ICD-10-CM

## 2020-12-10 DIAGNOSIS — K7581 Nonalcoholic steatohepatitis (NASH): Secondary | ICD-10-CM | POA: Diagnosis present

## 2020-12-10 DIAGNOSIS — I878 Other specified disorders of veins: Secondary | ICD-10-CM | POA: Diagnosis present

## 2020-12-10 DIAGNOSIS — I5032 Chronic diastolic (congestive) heart failure: Secondary | ICD-10-CM | POA: Diagnosis present

## 2020-12-10 DIAGNOSIS — Z79899 Other long term (current) drug therapy: Secondary | ICD-10-CM | POA: Diagnosis not present

## 2020-12-10 DIAGNOSIS — U071 COVID-19: Secondary | ICD-10-CM | POA: Diagnosis present

## 2020-12-10 LAB — BASIC METABOLIC PANEL
Anion gap: 10 (ref 5–15)
BUN: 10 mg/dL (ref 8–23)
CO2: 24 mmol/L (ref 22–32)
Calcium: 9.1 mg/dL (ref 8.9–10.3)
Chloride: 101 mmol/L (ref 98–111)
Creatinine, Ser: 0.99 mg/dL (ref 0.44–1.00)
GFR, Estimated: 57 mL/min — ABNORMAL LOW (ref >60)
Glucose, Bld: 169 mg/dL — ABNORMAL HIGH (ref 70–99)
Potassium: 3.9 mmol/L (ref 3.5–5.1)
Sodium: 135 mmol/L (ref 135–145)

## 2020-12-10 LAB — LIPID PANEL
Cholesterol: 121 mg/dL (ref 0–200)
HDL: 40 mg/dL — ABNORMAL LOW (ref >40)
LDL Cholesterol: 41 mg/dL (ref 0–99)
Total CHOL/HDL Ratio: 3 RATIO
Triglycerides: 199 mg/dL — ABNORMAL HIGH (ref <150)
VLDL: 40 mg/dL (ref 0–40)

## 2020-12-10 LAB — CBC
HCT: 44.8 % (ref 36.0–46.0)
Hemoglobin: 14.4 g/dL (ref 12.0–15.0)
MCH: 29.8 pg (ref 26.0–34.0)
MCHC: 32.1 g/dL (ref 30.0–36.0)
MCV: 92.8 fL (ref 80.0–100.0)
Platelets: 217 10*3/uL (ref 150–400)
RBC: 4.83 MIL/uL (ref 3.87–5.11)
RDW: 13.7 % (ref 11.5–15.5)
WBC: 11.8 10*3/uL — ABNORMAL HIGH (ref 4.0–10.5)
nRBC: 0 % (ref 0.0–0.2)

## 2020-12-10 LAB — TSH: TSH: 1.355 u[IU]/mL (ref 0.350–4.500)

## 2020-12-10 LAB — HEMOGLOBIN A1C
Hgb A1c MFr Bld: 6.7 % — ABNORMAL HIGH (ref 4.8–5.6)
Mean Plasma Glucose: 145.59 mg/dL

## 2020-12-10 MED ORDER — SODIUM CHLORIDE 0.9 % IV SOLN: INTRAVENOUS | Status: DC

## 2020-12-10 MED ORDER — ACETAMINOPHEN 500 MG PO TABS: 1000.0000 mg | ORAL_TABLET | Freq: Four times a day (QID) | ORAL | Status: DC | PRN

## 2020-12-10 NOTE — Hospital Course (Addendum)
Kristina May is a 85 y.o. female who presented with diffuse abdominal pain radiating to the back, imaging consistent with acute pancreatitis. PMH is significant for HTN, hypothyroidism, dyslipidemia, RA, depression, venous stasis, prior PE on Eliquis, PAF, appendectomy, COPD (not on home oxygen).    Acute Pancreatitis In ED vitals stable, afebrile. ED work up notable for WBC 13.9 with left shift. Albumin decreased at 3.1, likely in the setting of acute inflammation. Troponin and CTA obtained to rule out dissection given pain presentation. Trop flat at 20>18. CTA with evidence of acute pancreatitis. Interestingly, her Lipase is normal at 42. LA normal. UA with evidence of dehydration given SG >1.046, 5 ketones and 100 protein. LFT's within normal limits. Pain control with Dilaudid 0.5 mg q1h PRN. She was started with a clear liquid diet and advanced as tolerated. Pain medications ultimately changed to tylenol PRN for mild pain and oxy IR 5 mg for moderate and breakout pain. Patient was able to tolerate regular diet with adequate pain control on day of d/c.   COVID-19 infection COVID positive on admission. Unclear whether acute or lingering infection. Did not have oxygen requirement throughout admission but did have a cough with some shortness of breath (has hx of COPD, unsure if worsened from baseline). She was started on treatment with Remdesivir and received 2 doses during hospitalization.   Cirrhosis CTA with evidence of nodular liver consistent with steatosis. RUQ U/S was obtained to further investigate and consistent with cirrhosis. Hepatitis panel negative. 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 Referral to hepatology sent on discharge.   COPD Patient was continued on prednisone taper that was started outpatient. Received 20 mg x3 days, and decreased to 10 mg by day of discharge with instructions to continue taper outpatient. Continued on Dulera (per  hospital formulary) and incruse ellipta with albuterol and duonebs PRN.   HFpEF Lasix and Klor-con initially held on admission. Restarted prior to d/c. Weight remained stable throughout admission.   All other chronic medications stable.  PCP recommendations Consider GI consult for acute pancreatitis. This was pt's second episode.  Recommend addition of compression stockings for b/l lower extremity edema  Elevated BP's during hospitalization. Recommend recheck and adjustment in medications as necessary.

## 2020-12-11 DIAGNOSIS — K859 Acute pancreatitis without necrosis or infection, unspecified: Secondary | ICD-10-CM | POA: Diagnosis not present

## 2020-12-11 DIAGNOSIS — U071 COVID-19: Secondary | ICD-10-CM | POA: Diagnosis not present

## 2020-12-11 LAB — BASIC METABOLIC PANEL
Anion gap: 7 (ref 5–15)
BUN: 10 mg/dL (ref 8–23)
CO2: 22 mmol/L (ref 22–32)
Calcium: 8.8 mg/dL — ABNORMAL LOW (ref 8.9–10.3)
Chloride: 106 mmol/L (ref 98–111)
Creatinine, Ser: 0.89 mg/dL (ref 0.44–1.00)
GFR, Estimated: >60 mL/min (ref >60)
Glucose, Bld: 163 mg/dL — ABNORMAL HIGH (ref 70–99)
Potassium: 4 mmol/L (ref 3.5–5.1)
Sodium: 135 mmol/L (ref 135–145)

## 2020-12-11 LAB — CBC
HCT: 42.7 % (ref 36.0–46.0)
Hemoglobin: 14.5 g/dL (ref 12.0–15.0)
MCH: 30.6 pg (ref 26.0–34.0)
MCHC: 34 g/dL (ref 30.0–36.0)
MCV: 90.1 fL (ref 80.0–100.0)
Platelets: 221 10*3/uL (ref 150–400)
RBC: 4.74 MIL/uL (ref 3.87–5.11)
RDW: 13.7 % (ref 11.5–15.5)
WBC: 12 10*3/uL — ABNORMAL HIGH (ref 4.0–10.5)
nRBC: 0 % (ref 0.0–0.2)

## 2020-12-11 LAB — HEPATITIS PANEL, ACUTE
HCV Ab: NONREACTIVE
Hep A IgM: NONREACTIVE
Hep B C IgM: NONREACTIVE
Hepatitis B Surface Ag: NONREACTIVE

## 2020-12-11 LAB — URINE CULTURE: Culture: <10000 — AB

## 2020-12-11 MED ORDER — OXYCODONE HCL 5 MG PO TABS
5.0000 mg | ORAL_TABLET | ORAL | Status: DC | PRN
  Administered 2020-12-12: 5 mg via ORAL
  Filled 2020-12-11: qty 1

## 2020-12-11 MED ORDER — SODIUM CHLORIDE 0.9 % IV SOLN
100.0000 mg | Freq: Every day | INTRAVENOUS | Status: DC
  Administered 2020-12-12: 100 mg via INTRAVENOUS
  Filled 2020-12-11: qty 20

## 2020-12-11 MED ORDER — ACETAMINOPHEN 500 MG PO TABS: 1000.0000 mg | ORAL_TABLET | Freq: Four times a day (QID) | ORAL | Status: DC | PRN

## 2020-12-11 MED ORDER — SODIUM CHLORIDE 0.9 % IV SOLN
200.0000 mg | Freq: Once | INTRAVENOUS | Status: AC
  Administered 2020-12-11: 200 mg via INTRAVENOUS
  Filled 2020-12-11: qty 40

## 2020-12-11 NOTE — Progress Notes (Addendum)
Family Medicine Teaching Service Daily Progress Note Intern Pager: [phone]  Patient name: Kristina May Medical record number: [id number] Date of birth: 02/18/1936 Age: 85 y.o. Gender: female  Primary Care Provider: Jettie Pagan, NP Consultants: None Code Status: DNR  Pt Overview and Major Events to Date:  3/11: Admitted  Assessment and Plan: Kristina May a 85 y.o. female who presented with diffuse abdominal pain radiating to the back, imaging consistent with acute pancreatitis. PMH is significant forHTN, hypothyroidism, dyslipidemia, RA, depression, venous stasis, prior PE on Eliquis, PAF, appendectomy, COPD (not on home oxygen).  Acute Pancreatitis: improving Has not required Dilaudid PRN since yesterday afternoon. Patient reports pain is improved this morning.  Was able to eat some eggs and an English muffin.  Exam notable for mild tenderness to palpation only in the left upper quadrant. No nausea or vomiting. Patient is drinking fluids, would like IV taken out as it is bothersome. -D/c IVF -D/c Dilaudid -Tylenol 1000 mg q6h PRN mild pain -Oxy 5 mg q4h PRN moderate pain  Cirrhosis: new finding, stable Seen on RUQ U/S. Likely secondary to NASH as patient rarely drinks and is obese. Hepatitis panel negative. -Outpatient hepatology referral -Encourage lifestyle modifications  COPD: Chronic, stable Home meds: Duoneb, incruse ellipta, advair, fexofenadine, albuterol. Wheezing appreciated on exam, also with increased expiration phase about 3-4x inspiration phase. SOB with movement but has no oxygen requirement, O2 saturations 95-98% on room air. Patient notes white sputum on exam. Vitals stable and afebrile, do not feel she is having exacerbation requiring antibiotics. Declined PT. -Continue Dulera (per formulary) -Continue incruse ellipta -Albuterol and Duonebs PRN wheezing, SOB -Continue steroid taper with 20 mg, day 3/3. Decrease to 10 mg tomorrow  Incidental  COVID-19 infection: stable Question if acute or lingering infection. Has not had oxygen requirement. -Continue airborne and contact precuations -Continue steroid taper (for COPD exacerbation) -Continuous pulse ox; consider COVID treatment if develops O2 requirement -D/w patient regarding treatment with Remdesivir  Diabetes Mellitus Hgb A1c 6.7.  -Encourage lifestyle modifications  -No medications at this time as A1c <7%  HFpEF: chronic, stable Weight stable. UOP recorded as 500 cc, question accuracy. Patient reports no difficulties with urinating.  -Holding home Lasix and Klor-con -Strict I/O -Daily weights  HTN: chronic, stable Systolic 141-189, diastolic 84-99. Most recent 141/90.  -Continue home Losartan 12.5 mg  Dyslipidemia: chronic, stable LDL 41, TG elevated to 199.  -Continue home pravastatin   Rheumatoid arthritis: chronic, stable -Continue home pred and Arava  Hypothryoidism: chronic, stable TSH 1.355. -Continue home synthroid 150 mcg daily   Hx of A-fib  Hx of PE: chronic, stable -Continue home Eliquis and Metoprolol -Cardiac monitoring    FEN/GI: Regular diet, NS at 125 mL/hr PPx: Eliquis   Status is: Inpatient  Remains inpatient appropriate because:Ongoing active pain requiring inpatient pain management   Dispo: The patient is from: Home              Anticipated d/c is to: Home              Patient currently is not medically stable to d/c.   Difficult to place patient No   Subjective:  Patient feels generally weak this morning.  Notes shortness of breath when trying to get up and with movements.  She feels that her abdominal pain is improving.  She was able to eat scrambled eggs and an English muffin this morning, though was not a fan of the English muffin.  She has not had a bowel  movement since admission though admits to poor p.o. intake previously.  She thinks she would benefit from another night in the hospital due to her weakness.  Also notes  she has increased cough with white sputum.  States that sputum production is normal for her.  Is wondering why she is coughing.  No nausea or vomiting.  No diarrhea.  Is able to hydrate orally, would like IV out as it is bothersome to her.  Objective: Temp:  [97.6 F (36.4 C)-97.7 F (36.5 C)] 97.6 F (36.4 C) (03/13 0620) Pulse Rate:  [62-92] 62 (03/13 0620) Resp:  [16-22] 17 (03/13 0620) BP: (141-189)/(84-99) 141/90 (03/13 0620) SpO2:  [95 %-98 %] 98 % (03/13 1761) Physical Exam: General: Awake, alert, in no distress, pleasant, conversational Cardiovascular: Irregularly irregular, rate controlled Respiratory: Diffuse wheezing in all lung fields but worse in bilateral lower lobes.  With increased exhalation phase 3-4 times that of inspiration.  Abdomen: Mild tenderness to palpation of the left upper quadrant, soft, nondistended, without rebound or guarding, normal active bowel sounds Extremities: Nonpitting edema of bilateral lower extremities, 2+ radial and DP pulses bilaterally  Laboratory: Recent Labs  Lab 12/09/20 1322 12/10/20 0426 12/11/20 0147  WBC 13.9* 11.8* 12.0*  HGB 15.4* 14.4 14.5  HCT 48.0* 44.8 42.7  PLT 238 217 221   Recent Labs  Lab 12/09/20 1322 12/10/20 0426 12/11/20 0147  NA 136 135 135  K 3.8 3.9 4.0  CL 103 101 106  CO2 23 24 22   BUN 11 10 10   CREATININE 0.93 0.99 0.89  CALCIUM 9.0 9.1 8.8*  PROT 5.8*  --   --   BILITOT 1.0  --   --   ALKPHOS 109  --   --   ALT 27  --   --   AST 16  --   --   GLUCOSE 137* 169* 163*   Hgb A1c 6.7%.   Imaging/Diagnostic Tests: IMPRESSION: 1. The liver demonstrates a nodular contour consistent with cirrhosis. 2. No other abnormalities.  , DO 12/11/2020, 8:36 AM PGY-1, [facility] FPTS Intern pager: [phone], text pages welcome

## 2020-12-11 NOTE — Plan of Care (Signed)
  Problem: Education: Goal: Knowledge of risk factors and measures for prevention of condition will improve Outcome: Progressing   Problem: Respiratory: Goal: Will maintain a patent airway Outcome: Progressing Goal: Complications related to the disease process, condition or treatment will be avoided or minimized Outcome: Progressing   

## 2020-12-11 NOTE — Discharge Summary (Addendum)
Family Medicine Teaching [facility] Discharge Summary  Patient name: Kristina May Medical record number: [id number] Date of birth: [birth year] Age: 85 y.o. 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 rate 20, height 5\' 6"  (1.676 m), weight 122.7 kg, SpO2 95 %. Gen: Awake, alert, no distress Cardio: Irregularly irregular with rate control Resp: Mild inspiratory and expiratory wheeze in left lower lobe, no respiratory distress, saturating well on room air Abd: soft, non-tender in all quadrants, non-distended, no rebound or guarding, no organomegaly Ext: mild pitting edema to b/l lower extremities, cooler to the touch but with 2+ DP pulses b/l  Brief Hospital Course:  Kristina May is a 85 y.o. female who presented with diffuse abdominal pain radiating to the back, imaging consistent with acute pancreatitis. PMH is significant for HTN, hypothyroidism, dyslipidemia, RA, depression, venous stasis, prior PE on Eliquis, PAF, appendectomy, COPD (not on home oxygen).    Acute Pancreatitis In ED vitals stable, afebrile. ED work up notable for WBC 13.9 with left shift. Albumin decreased at 3.1, likely in the setting of acute inflammation. Troponin and CTA obtained to rule out dissection given pain presentation. Trop flat at 20>18. CTA with evidence of acute pancreatitis. Interestingly, her Lipase is normal at 42. LA normal. UA with evidence of dehydration given SG >1.046, 5 ketones and 100 protein. LFT's within normal limits.  Pain control with Dilaudid 0.5 mg q1h PRN. She was started with a clear liquid diet and advanced as tolerated. Pain medications ultimately changed to tylenol PRN for mild pain and oxy IR 5 mg for moderate and breakout pain. Patient was able to tolerate regular diet with adequate pain control on day of d/c.   COVID-19 infection COVID positive on admission. Unclear whether acute or lingering infection. Did not have oxygen requirement throughout admission but did have a cough with some shortness of breath (has hx of COPD, unsure if worsened from baseline). She was started on treatment with Remdesivir and received 2 doses during hospitalization.   Cirrhosis CTA with evidence of nodular liver consistent with steatosis. RUQ U/S was obtained to further investigate and consistent with cirrhosis. Hepatitis panel negative. 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 Referral to hepatology sent on discharge.   COPD Patient was continued on prednisone taper that was started outpatient. Received 20 mg x3 days, and decreased to 10 mg by day of discharge with instructions to continue taper outpatient. Continued on Dulera (per hospital formulary) and incruse ellipta with albuterol and duonebs PRN.   HFpEF Lasix and Klor-con initially held on admission. Restarted prior to d/c. Weight remained stable throughout admission.   All other chronic medications stable.  PCP recommendations 1. Consider GI consult for acute pancreatitis. This was pt's second episode.  2. Recommend addition of compression stockings for b/l lower extremity edema  3. Elevated BP's during hospitalization. Recommend recheck and adjustment in medications as necessary.   Significant Procedures: None  Significant Labs and Imaging:  Recent Labs  Lab 12/09/20 1322 12/10/20 0426 12/11/20 0147  WBC 13.9* 11.8* 12.0*  HGB 15.4* 14.4 14.5  HCT 48.0* 44.8 42.7  PLT 238 217 221   Recent Labs  Lab  12/09/20 1322 12/10/20 0426 12/11/20 0147  NA 136 135 135  K 3.8 3.9 4.0  CL 103 101 106  CO2 23 24 22   GLUCOSE 137* 169* 163*  BUN 11 10 10   CREATININE 0.93 0.99 0.89  CALCIUM 9.0 9.1 8.8*  ALKPHOS 109  --   --   AST 16  --   --   ALT 27  --   --   ALBUMIN 3.1*  --   --     No results found. 3/11 CTA Chest/Abd/Pelv IMPRESSION: 1. No evidence of aortic dissection or aneurysm. 2. Peripancreatic fat stranding consistent with acute pancreatitis. No well-formed drainable collection. 3. Hepatic steatosis with findings suspicious for underlying cirrhosis. 4. Dilated main pulmonary artery which can be seen in patients with elevated pulmonary artery pressures. 5. Rectosigmoid diverticulosis without acute inflammation. Aortic Atherosclerosis (ICD10-I70.0).  3/11 Chest X-ray 1-View IMPRESSION: 1. Underpenetrated AP portable examination without acute abnormality of the lungs. 2. Cardiomegaly.   Results/Tests Pending at Time of Discharge: None  Discharge Medications:  Allergies as of 12/12/2020      Reactions   Betamethasone Hives   Dexamethasone Hives   Infliximab Rash, Other (See Comments)   Remicade = "She went to rehab because of it"   Latex Other (See Comments)   Causes blisters   Tape Other (See Comments)   Caused blisters    Tylenol [acetaminophen] Other (See Comments)   Doesn't work for patient    Claritin [loratadine] Other (See Comments)   Doesn't work for patient      Medication List    STOP taking these medications   magnesium chloride 64 MG Tbec SR tablet Commonly known as: SLOW-MAG   traZODone 50 MG tablet Commonly known as: DESYREL     TAKE these medications   acidophilus Caps capsule Take 1 capsule by mouth daily.   albuterol 108 (90 Base) MCG/ACT inhaler Commonly known as: VENTOLIN HFA Inhale 2 puffs into the lungs every 6 (six) hours as needed for wheezing or shortness of breath.   apixaban 5 MG Tabs tablet Commonly known as:  ELIQUIS Take 5 mg by mouth 2 (two) times daily.   aspirin EC 81 MG tablet Take 81 mg by mouth in the morning.   fexofenadine 180 MG tablet Commonly known as: ALLEGRA Take 180 mg by mouth every evening.   Fluticasone-Salmeterol 250-50 MCG/DOSE Aepb Commonly known as: ADVAIR Inhale 1 puff into the lungs 2 (two) times daily.   folic acid 1 MG tablet Commonly known as: FOLVITE Take 1 mg by mouth daily.   furosemide 20 MG tablet Commonly known as: LASIX Take 20 mg by mouth daily. What changed: Another medication with the same name was removed. Continue taking this medication, and follow the directions you see here.   guaiFENesin 600 MG 12 hr tablet Commonly known as: MUCINEX Take 1,200 mg by mouth every evening.   Incruse Ellipta 62.5 MCG/INH Aepb Generic drug: umeclidinium bromide Inhale 1 puff into the lungs in the morning.   ipratropium-albuterol 0.5-2.5 (3) MG/3ML Soln Commonly known as: DUONEB Take 3 mLs by nebulization every 6 (six) hours as needed (wheezing).   leflunomide 10 MG tablet Commonly known as: ARAVA Take 10 mg by mouth in the morning.   levothyroxine 150 MCG tablet Commonly known as: SYNTHROID Take 150 mcg by mouth daily before breakfast.   losartan 25 MG tablet Commonly known as: COZAAR Take  12.5 mg by mouth in the morning.   metoprolol succinate 50 MG 24 hr tablet Commonly known as: TOPROL-XL Take 50 mg by mouth 2 (two) times daily. Take with or immediately following a meal.   naproxen sodium 220 MG tablet Commonly known as: ALEVE Take 220 mg by mouth 2 (two) times daily as needed (for pain).   nystatin powder Commonly known as: MYCOSTATIN/NYSTOP Apply topically 3 (three) times daily. What changed:   how much to take  when to take this  reasons to take this   oxyCODONE 5 MG immediate release tablet Commonly known as: Oxy IR/ROXICODONE Take 1 tablet (5 mg total) by mouth every 4 (four) hours as needed for moderate pain.   potassium  chloride SA 20 MEQ tablet Commonly known as: KLOR-CON Take 2 tablets (40 mEq total) by mouth 2 (two) times daily. What changed:   how much to take  when to take this   pravastatin 40 MG tablet Commonly known as: PRAVACHOL Take 40 mg by mouth at bedtime.   predniSONE 5 MG tablet Commonly known as: DELTASONE Take 5 mg by mouth 2 (two) times daily with a meal.   Vitamin D3 50 MCG (2000 UT) Tabs Take 2,000 microcuries/1.58m2 by mouth daily.       Discharge Instructions: Please refer to Patient Instructions section of EMR for full details.  Patient was counseled important signs and symptoms that should prompt return to medical care, changes in medications, dietary instructions, activity restrictions, and follow up appointments.   Follow-Up Appointments:  Follow-up Information    Jettie Pagan, NP. Schedule an appointment as soon as possible for a visit.   Specialty: Nurse Practitioner Why: Please make an appointment for follow up this week.  Contact information: [address] [place] [address] [phone]               Sabino Dick, DO 12/12/2020, 2:00 PM PGY-1, [facility]

## 2020-12-12 ENCOUNTER — Other Ambulatory Visit: Payer: Self-pay | Admitting: Family Medicine

## 2020-12-12 MED ORDER — FUROSEMIDE 40 MG PO TABS
40.0000 mg | ORAL_TABLET | Freq: Two times a day (BID) | ORAL | Status: DC
  Administered 2020-12-12: 40 mg via ORAL
  Filled 2020-12-12: qty 1

## 2020-12-12 MED ORDER — OXYCODONE HCL 5 MG PO TABS: 5.0000 mg | ORAL_TABLET | ORAL | 0 refills | Status: DC | PRN

## 2020-12-12 MED ORDER — POTASSIUM CHLORIDE CRYS ER 20 MEQ PO TBCR
40.0000 meq | EXTENDED_RELEASE_TABLET | Freq: Two times a day (BID) | ORAL | Status: DC
  Administered 2020-12-12: 40 meq via ORAL
  Filled 2020-12-12: qty 2

## 2020-12-12 NOTE — Progress Notes (Signed)
Kristina May to be D/C'd Home per MD order.  Discussed with the patient and all questions fully answered.  VSS, Skin clean, dry and intact without evidence of skin break down, no evidence of skin tears noted. IV catheter discontinued intact. Site without signs and symptoms of complications. Dressing and pressure applied.  An After Visit Summary was printed and given to the patient. Patient received prescription.  D/c education completed with patient/family including follow up instructions, medication list, d/c activities limitations if indicated, with other d/c instructions as indicated by MD - patient able to verbalize understanding, all questions fully answered.   Patient instructed to return to ED, call 911, or call MD for any changes in condition.   Patient escorted via WC, and D/C home via private auto.  [name] 12/12/2020 5:09 PM

## 2020-12-12 NOTE — Discharge Instructions (Signed)
You were hospitalized at [facility] due to pancreatitis.  We are unfortunately not clear about what may have caused this. We would recommend following up with a Gastroenterologist for further work-up since this is now your second episode. In the hospital we managed your pain and gave you fluid hydration. We sent some pain medication to your pharmacy.   You were also found to be incidentally COVID-19 positive. You received your first dose of Remdesivir in the hospital and can follow up outpatient for continued infusions.   Additionally, on imaging of your abdomen cirrhosis of your liver identified. We suspect this is likely due to fatty liver disease. We sent a referral to Hepatology. We would recommend weight loss and low-fat diet.   Continue your Prednisone taper. You can take 10 mg for the next 2 days, until 3/16 and then resume your home prednisone of 5 mg on 3/17.   We are so glad you are feeling better.  Be sure to follow-up with your regularly scheduled appointments.  Please also sure to follow up with your PCP at your earliest convenience.  Thank you for allowing [place] to take care of you.  Take care, [facility] family medicine team

## 2020-12-13 ENCOUNTER — Telehealth: Payer: Self-pay

## 2020-12-13 NOTE — Telephone Encounter (Signed)
Called to discuss with patient about COVID-19 symptoms and the use of one of the available treatments for those with mild to moderate Covid symptoms and at a high risk of hospitalization.  Pt appears to qualify for outpatient treatment due to co-morbid conditions and/or a member of an at-risk group in accordance with the FDA Emergency Use Authorization.    Symptom onset: Unknown Vaccinated: Yes Booster? Yes Immunocompromised? No Qualifiers: HTN,COPD  Pt. Received Remdesivir in the hospital.   Kristina May

## 2020-12-14 NOTE — Telephone Encounter (Signed)
Attempted to reach pt. Again to verify treatment for COVID19 in hospital. Left message.

## 2021-05-26 DIAGNOSIS — R911 Solitary pulmonary nodule: Secondary | ICD-10-CM | POA: Insufficient documentation

## 2021-07-19 DIAGNOSIS — G894 Chronic pain syndrome: Secondary | ICD-10-CM | POA: Insufficient documentation

## 2021-07-19 DIAGNOSIS — I5032 Chronic diastolic (congestive) heart failure: Secondary | ICD-10-CM | POA: Insufficient documentation

## 2022-04-17 ENCOUNTER — Ambulatory Visit: Payer: Medicare Other | Admitting: Podiatry

## 2022-04-17 DIAGNOSIS — L02611 Cutaneous abscess of right foot: Secondary | ICD-10-CM

## 2022-04-17 DIAGNOSIS — B351 Tinea unguium: Secondary | ICD-10-CM | POA: Diagnosis not present

## 2022-04-17 DIAGNOSIS — M79675 Pain in left toe(s): Secondary | ICD-10-CM | POA: Diagnosis not present

## 2022-04-17 DIAGNOSIS — L600 Ingrowing nail: Secondary | ICD-10-CM

## 2022-04-17 DIAGNOSIS — M79674 Pain in right toe(s): Secondary | ICD-10-CM

## 2022-04-17 DIAGNOSIS — Z7901 Long term (current) use of anticoagulants: Secondary | ICD-10-CM

## 2022-04-17 DIAGNOSIS — R21 Rash and other nonspecific skin eruption: Secondary | ICD-10-CM

## 2022-04-17 MED ORDER — CEPHALEXIN 500 MG PO CAPS: 500.0000 mg | ORAL_CAPSULE | Freq: Three times a day (TID) | ORAL | 0 refills | Status: DC

## 2022-04-17 NOTE — Progress Notes (Signed)
Subjective:   Patient ID: Kristina May, female   DOB: 86 y.o.   MRN: [id number]   HPI Chief Complaint  Patient presents with   Nail Problem    Pt came in today with Right  GREAT TOE FUNGUS and 5TH TOE SMALL RASH - Routine foot care - NAIL TRIM, nail fungus started 5 mths ago, seen by PCP, [place]: anabiotic    86 year old female with the above history. She has thick, elonaged toenails that she cannot trim herself. She has previsuly been on antibiotics for the right big toenail given ingrown toenail.  Despite that she still having pain and she has had bleeding and scabbing present along the nail border.  Her dauther has tried OTC medicaiton for the nail fungus as well.  She reviewed the ingrown toenail left big toe as well but this is resolved.  She also has a rash on the dorsal aspect of the right foot which does not open or any drainage or itching.  Last A1c was 6.7 on 12/10/2021  She is on Eliquis  Review of Systems  All other systems reviewed and are negative.  Past Medical History:  Diagnosis Date   A-fib (HCC)    COPD (chronic obstructive pulmonary disease) (HCC)    Dyslipidemia    Hypertension    Pancreatitis    PE (pulmonary thromboembolism) (HCC)    Rheumatoid arthritis(714.0)    Shingles    Thyroid disease     Past Surgical History:  Procedure Laterality Date   APPENDECTOMY     FOOT SURGERY     TOTAL HIP ARTHROPLASTY Left      Current Outpatient Medications:    cephALEXin (KEFLEX) 500 MG capsule, Take 1 capsule (500 mg total) by mouth 3 (three) times daily., Disp: 21 capsule, Rfl: 0   acidophilus (RISAQUAD) CAPS capsule, Take 1 capsule by mouth daily., Disp: 30 capsule, Rfl: 0   albuterol (VENTOLIN HFA) 108 (90 Base) MCG/ACT inhaler, Inhale 2 puffs into the lungs every 6 (six) hours as needed for wheezing or shortness of breath., Disp: , Rfl:    apixaban (ELIQUIS) 5 MG TABS tablet, Take 5 mg by mouth 2 (two) times daily., Disp: , Rfl:    aspirin EC 81 MG tablet, Take  81 mg by mouth in the morning., Disp: , Rfl:    Cholecalciferol (VITAMIN D3) 50 MCG (2000 UT) TABS, Take 2,000 microcuries/1.53m2 by mouth daily., Disp: , Rfl:    fexofenadine (ALLEGRA) 180 MG tablet, Take 180 mg by mouth every evening., Disp: , Rfl:    Fluticasone-Salmeterol (ADVAIR) 250-50 MCG/DOSE AEPB, Inhale 1 puff into the lungs 2 (two) times daily., Disp: , Rfl:    folic acid (FOLVITE) 1 MG tablet, Take 1 mg by mouth daily., Disp: , Rfl:    furosemide (LASIX) 20 MG tablet, Take 20 mg by mouth daily., Disp: , Rfl:    guaiFENesin (MUCINEX) 600 MG 12 hr tablet, Take 1,200 mg by mouth every evening., Disp: , Rfl:    INCRUSE ELLIPTA 62.5 MCG/INH AEPB, Inhale 1 puff into the lungs in the morning., Disp: , Rfl:    ipratropium-albuterol (DUONEB) 0.5-2.5 (3) MG/3ML SOLN, Take 3 mLs by nebulization every 6 (six) hours as needed (wheezing). , Disp: , Rfl:    leflunomide (ARAVA) 10 MG tablet, Take 10 mg by mouth in the morning., Disp: , Rfl:    levothyroxine (SYNTHROID) 150 MCG tablet, Take 150 mcg by mouth daily before breakfast. , Disp: , Rfl:    losartan (COZAAR) 25  MG tablet, Take 12.5 mg by mouth in the morning., Disp: , Rfl:    metoprolol succinate (TOPROL-XL) 50 MG 24 hr tablet, Take 50 mg by mouth 2 (two) times daily. Take with or immediately following a meal., Disp: , Rfl:    naproxen sodium (ALEVE) 220 MG tablet, Take 220 mg by mouth 2 (two) times daily as needed (for pain)., Disp: , Rfl:    nystatin (MYCOSTATIN/NYSTOP) powder, Apply topically 3 (three) times daily. (Patient taking differently: Apply 1 application topically 3 (three) times daily as needed (to affected sites).), Disp: 15 g, Rfl: 0   oxyCODONE (OXY IR/ROXICODONE) 5 MG immediate release tablet, Take 1 tablet (5 mg total) by mouth every 4 (four) hours as needed for moderate pain., Disp: 10 tablet, Rfl: 0   potassium chloride SA (K-DUR) 20 MEQ tablet, Take 2 tablets (40 mEq total) by mouth 2 (two) times daily. (Patient taking  differently: Take 20 mEq by mouth every evening.), Disp: 60 tablet, Rfl: 0   pravastatin (PRAVACHOL) 40 MG tablet, Take 40 mg by mouth at bedtime., Disp: , Rfl:    predniSONE (DELTASONE) 5 MG tablet, Take 5 mg by mouth 2 (two) times daily with a meal., Disp: , Rfl:   Allergies  Allergen Reactions   Betamethasone Hives   Dexamethasone Hives   Infliximab Rash and Other (See Comments)    Remicade = "She went to rehab because of it"   Latex Other (See Comments)    Causes blisters   Tape Other (See Comments)    Caused blisters    Tylenol [Acetaminophen] Other (See Comments)    Doesn't work for patient    Claritin [Loratadine] Other (See Comments)    Doesn't work for patient           Objective:  Physical Exam  General: AAO x3, NAD  Dermatological: In general the nails are hypertrophic, dystrophic with yellow discoloration subungual debris.  The right hallux toenail is loose with underlying nailbed and is quite thick and there is dried blood on the nail borders amount of purulence is noted from the lateral nail border.  There is no fluctuance or crepitation.  The other nails without any edema, erythema.  There is no open lesions.  The dorsal lateral aspect of the foot just behind the fifth MPJ small superficial rash present without any skin breakdown.  Vascular: Dorsalis Pedis artery and Posterior Tibial artery pedal pulses are palpable bilateral with immedate capillary fill time.  There is no pain with calf compression, swelling, warmth, erythema.  Chronic ankle, foot edema present.  Neruologic: Sensation decreased with Semmes Weinstein monofilament  Musculoskeletal: No gross boney pedal deformities bilateral. No pain, crepitus, or limitation noted with foot and ankle range of motion bilateral. Muscular strength 5/5 in all groups tested bilateral.     Assessment:   Symptomatic onychomycosis, right hallux toenail infection, skin rash right foot     Plan:  -Treatment options  discussed including all alternatives, risks, and complications. -Etiology of symptoms were discussed -At this time, recommended total nail removal without chemical matricectomy, I&D  to the right hallux due to infection. Risks and complications were discussed with the patient for which they understand and  verbally consent to the procedure. Under sterile conditions a total of 3 mL of a mixture of 2% lidocaine plain and 0.5% Marcaine plain was infiltrated in a hallux block fashion. Once anesthetized, the skin was prepped in sterile fashion. A tourniquet was then applied. Next the right hallux nail was  removed in total.  There was purulence noted a wound culture obtained.  Once the nail was  Removed, the area was debrided and the underlying skin was intact. The area was irrigated and hemostasis was obtained.  A dry sterile dressing was applied. After application of the dressing the tourniquet was removed and there is found to be an immediate capillary refill time to the digit. The patient tolerated the procedure well any complications. Post procedure instructions were discussed the patient for which he verbally understood. Follow-up in one week for nail check or sooner if any problems are to arise. Discussed signs/symptoms of worsening infection and directed to call the office immediately should any occur or go directly to the emergency room. In the meantime, encouraged to call the office with any questions, concerns, changes symptoms. -Wound culture -Keflex -Antibiotic ointment for the rash on the right foot.  Not able to use steroids based on her allergies. -Debrided the nails x9 without any complications or bleeding.  Vivi Barrack DPM

## 2022-04-17 NOTE — Patient Instructions (Signed)
Continue soaking in epsom salts twice a day followed by antibiotic ointment and a band-aid. Can leave uncovered at night. Continue this until completely healed.  If the area has not healed in 2 weeks, call the office for follow-up appointment, or sooner if any problems arise.  Monitor for any signs/symptoms of infection. Call the office immediately if any occur or go directly to the emergency room. Call with any questions/concerns.   HAVE SOMEONE ELSE CHECK THE TEMPERATURE OF THE WATER DUE TO THE NEUROPATHY

## 2022-04-20 LAB — WOUND CULTURE
MICRO NUMBER:: 13661719
SPECIMEN QUALITY:: ADEQUATE

## 2022-04-21 ENCOUNTER — Other Ambulatory Visit: Payer: Self-pay | Admitting: Podiatry

## 2022-04-21 MED ORDER — MICONAZOLE NITRATE 2 % EX CREA: 1.0000 | TOPICAL_CREAM | Freq: Two times a day (BID) | CUTANEOUS | 0 refills | Status: DC

## 2022-04-24 ENCOUNTER — Ambulatory Visit: Payer: Medicare Other | Admitting: Podiatry

## 2022-04-24 DIAGNOSIS — R21 Rash and other nonspecific skin eruption: Secondary | ICD-10-CM

## 2022-04-24 DIAGNOSIS — L02611 Cutaneous abscess of right foot: Secondary | ICD-10-CM

## 2022-05-01 NOTE — Progress Notes (Signed)
Subjective: Chief Complaint  Patient presents with   Wound Check    Pt is doing well, has hit the toe, oso has some bleeding, very little pain, hallux is sore. 5th toe has some pain    86 year old female presents to the office with her son today for the above complaints.  She states that the toe itself has been doing well.  She stopped soaking Epsom salts and she did not hit it once was causing pain this did cause some bleeding after she stopped soaking her toe.  Denies any pus.  The rash on the top of the foot near the fifth toe is also still present not changed.  No open lesions otherwise.  Objective: AAO x3, NAD DP/PT pulses palpable bilaterally, CRT less than 3 seconds Status post left hallux nail removal.  Area is starting to scab.  There is a rim of erythema without any ascending cellulitis.  No drainage or pus identified this time.  No significant pain.  No bleeding. Erythematous rash present on the right foot just proximal to the fourth, fifth MPJ.  No pain today lesions.  No drainage or pus. No pain with calf compression, swelling, warmth, erythema  Assessment: Status post left hallux total nail removal, yeast infection; foot rash  Plan: -All treatment options discussed with the patient including all alternatives, risks, complications.  -Continue soaking Epsom salts.  Discussed alternate antibiotic ointment and bandage in the day and she can use the miconazole cream at night.  -Miconazole cream for the right foot rash as well.  -Monitor for any clinical signs or symptoms of infection and directed to call the office immediately should any occur or go to the ER. -Patient encouraged to call the office with any questions, concerns, change in symptoms.   Vivi Barrack DPM

## 2022-05-15 ENCOUNTER — Ambulatory Visit: Payer: Medicare Other | Admitting: Podiatry

## 2022-05-15 DIAGNOSIS — M79674 Pain in right toe(s): Secondary | ICD-10-CM

## 2022-05-15 DIAGNOSIS — Z7901 Long term (current) use of anticoagulants: Secondary | ICD-10-CM | POA: Diagnosis not present

## 2022-05-15 DIAGNOSIS — M79675 Pain in left toe(s): Secondary | ICD-10-CM | POA: Diagnosis not present

## 2022-05-15 DIAGNOSIS — L600 Ingrowing nail: Secondary | ICD-10-CM | POA: Diagnosis not present

## 2022-05-15 DIAGNOSIS — B351 Tinea unguium: Secondary | ICD-10-CM

## 2022-05-15 DIAGNOSIS — R21 Rash and other nonspecific skin eruption: Secondary | ICD-10-CM

## 2022-05-15 MED ORDER — CICLOPIROX 8 % EX SOLN: Freq: Every day | CUTANEOUS | 0 refills | Status: DC

## 2022-05-22 NOTE — Progress Notes (Signed)
Subjective: Chief Complaint  Patient presents with   Foot Problem     3 weeks for nail/wound check, right foot is improving, no drainage, slight pain     86 year old female presents to the office with her son today for the above complaints.  She is in the procedure site is doing well.  Some slight minimal discomfort but no significant pain.  She is continuing keeping the bandage on the toe and peers to have healed.  No drainage or pus.  She has the other nails trimmed as are thickened elongated she is not able to trim them herself.  No swelling redness or any drainage to the other toenail sites.  No other concerns.  Objective: AAO x3, NAD DP/PT pulses palpable bilaterally, CRT less than 3 seconds Status post left hallux nail removal.  New, healthy skin present along the appears to be healing well and only 1 small area remains open but almost completely healed.  There is no surrounding erythema, ascending cellulitis.  No drainage or pus.  No fluctuance or crepitation.  No malodor. Nails are hypertrophic, dystrophic, brittle, discolored, elongated 10. No surrounding redness or drainage. Tenderness nails 1-5 bilaterally except for right hallux toenail as it is recently been removed. No open lesions or pre-ulcerative lesions are identified today. Erythematous rash present on the right foot just proximal to the fourth, fifth MPJ with improvement.  No pain today lesions.  No drainage or pus. No pain with calf compression, swelling, warmth, erythema  Assessment: Status post left hallux total nail removal, yeast infection; foot rash  Plan: -All treatment options discussed with the patient including all alternatives, risks, complications.  -Regards to the procedure site continue washing with soap and water, dry thoroughly apply antibiotic ointment and a bandage of the day.  Leave the area open at nighttime around the house.  Not heal the next 1 to 2 weeks to let me know significant limited -Sharply  debrided the nails x9 without any complications or bleeding as a courtesy.  Recommend getting her regular scheduled for routine nail debridements. -Continue cream for skin rash. -Monitor for any clinical signs or symptoms of infection and directed to call the office immediately should any occur or go to the ER. -Patient encouraged to call the office with any questions, concerns, change in symptoms.   Vivi Barrack DPM

## 2022-06-28 IMAGING — CT CT ANGIO CHEST-ABD-PELV FOR DISSECTION W/ AND WO/W CM
2 of 8 series · 14 of 46 positions shown, 16 images · non-contrast
Comparison: None.

CLINICAL DATA: Chest pain and back pain.

EXAM:
CT ANGIOGRAPHY CHEST, ABDOMEN AND PELVIS
TECHNIQUE: Non-contrast CT of the chest was initially obtained.

[Series 7: dissection 3.0 i30f 3 · axial · 0.98mm/px · z∈[+886,+1435]mm · 11 of 213 slices shown, 13 images]
[im 15/213  soft-tissue]
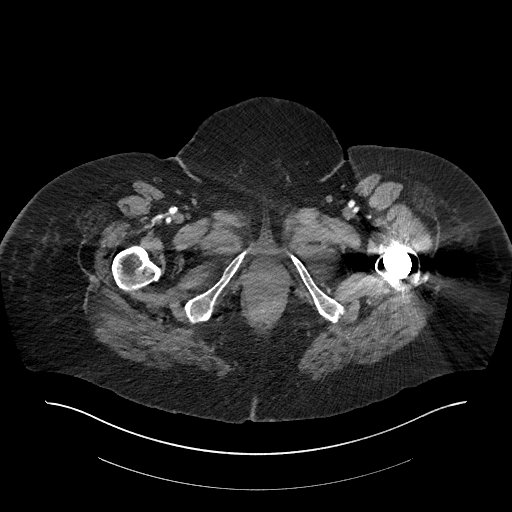
[im 15/213  bone]
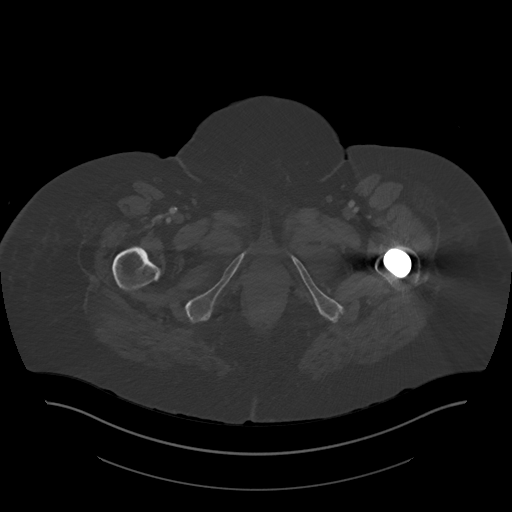
[im 29/213  soft-tissue]
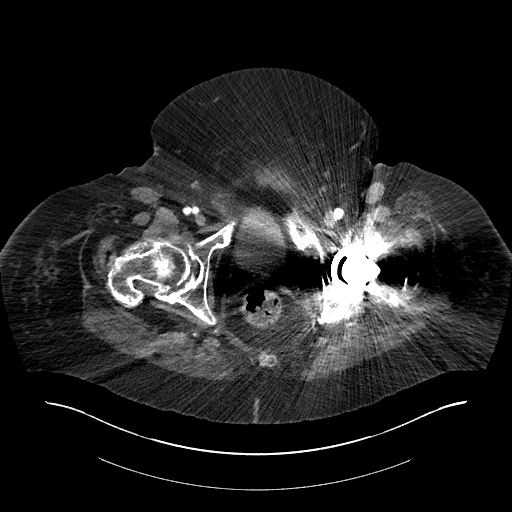
[im 57/213  soft-tissue]
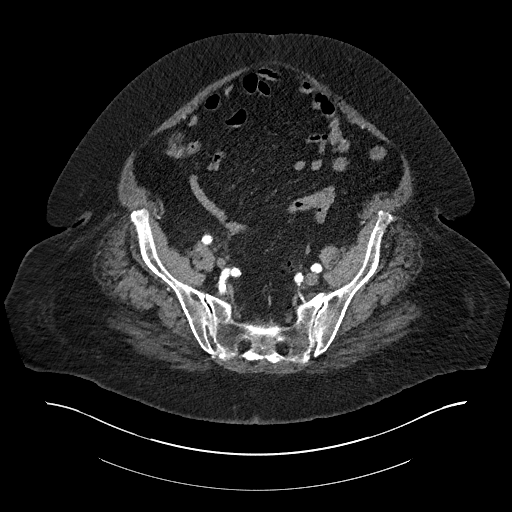
[im 71/213  soft-tissue]
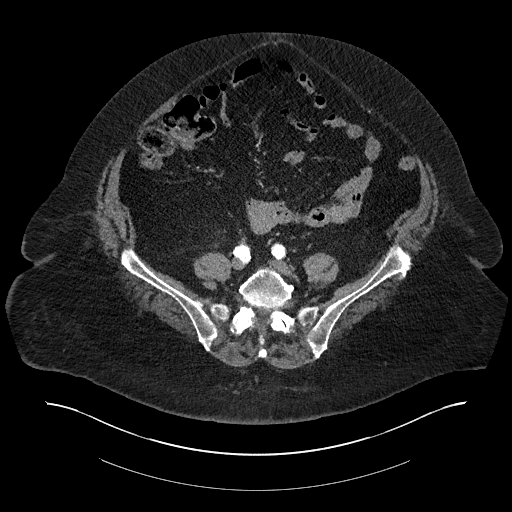
[im 85/213  soft-tissue]
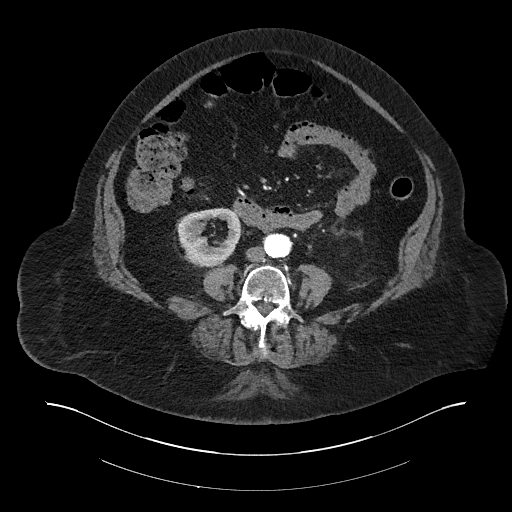
[im 114/213  soft-tissue]
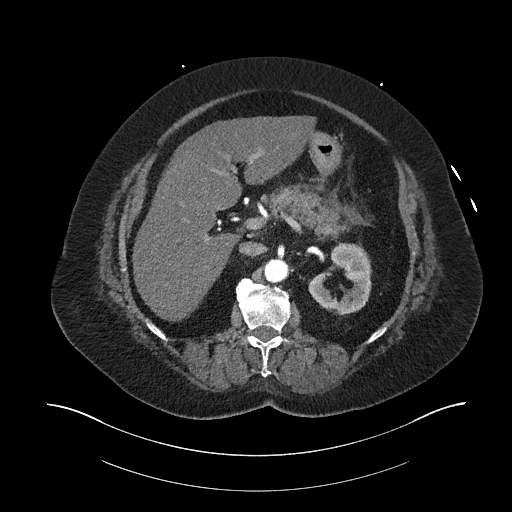
[im 128/213  soft-tissue]
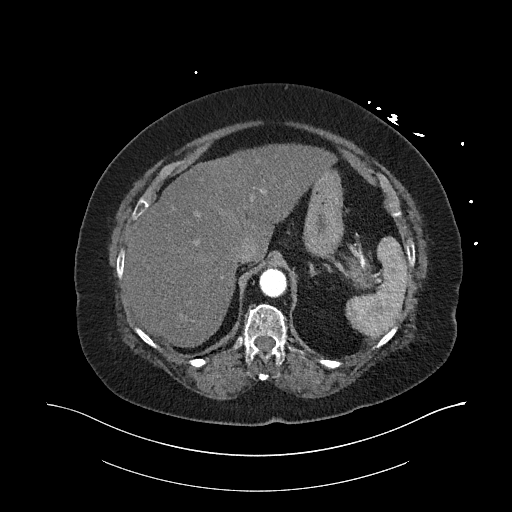
[im 142/213  soft-tissue]
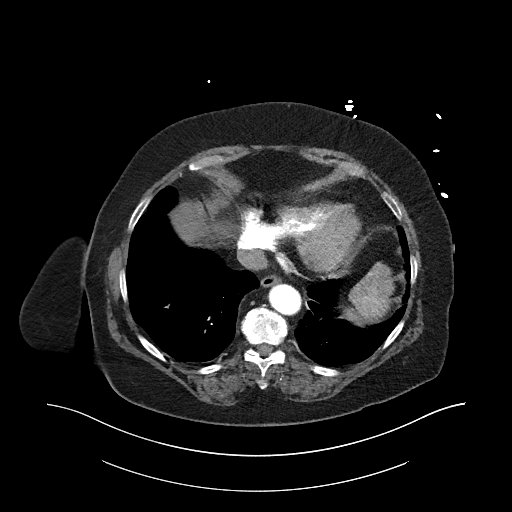
[im 156/213  soft-tissue]
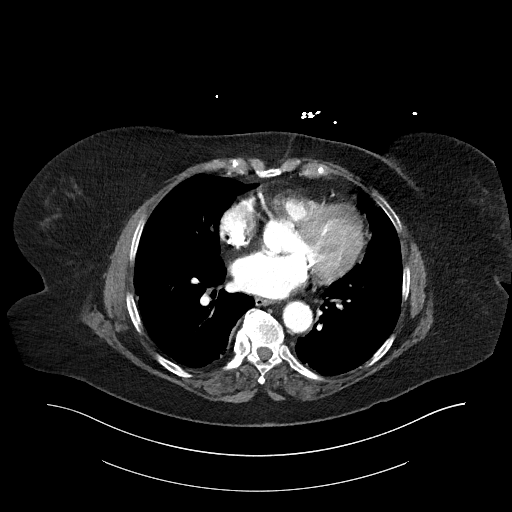
[im 156/213  bone]
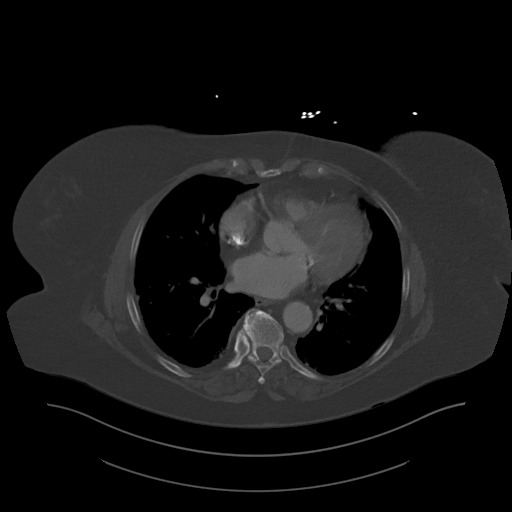
[im 184/213  soft-tissue]
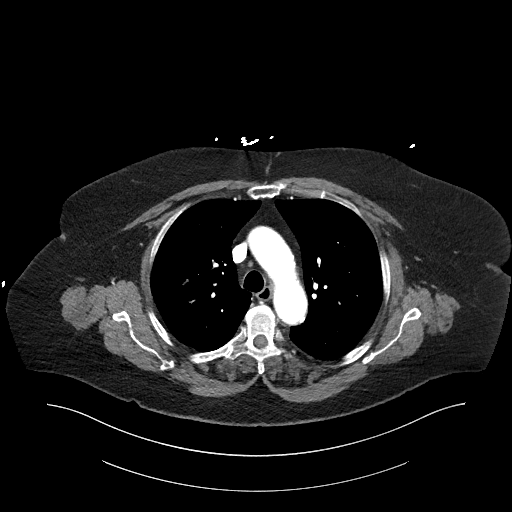
[im 198/213  soft-tissue]
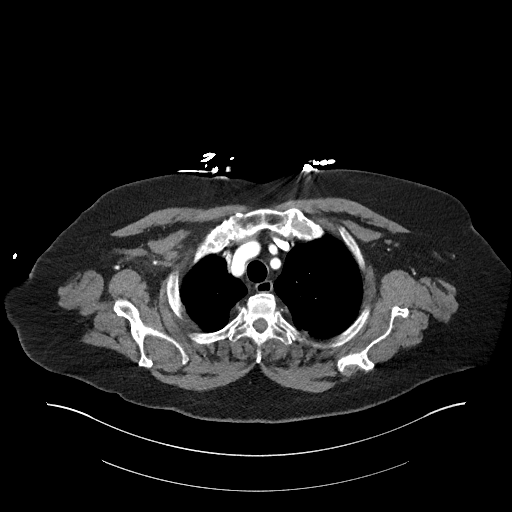

[Series 10: coronals · coronal · 0.93mm/px · 3 of 211 slices shown]
[im 53/211  soft-tissue]
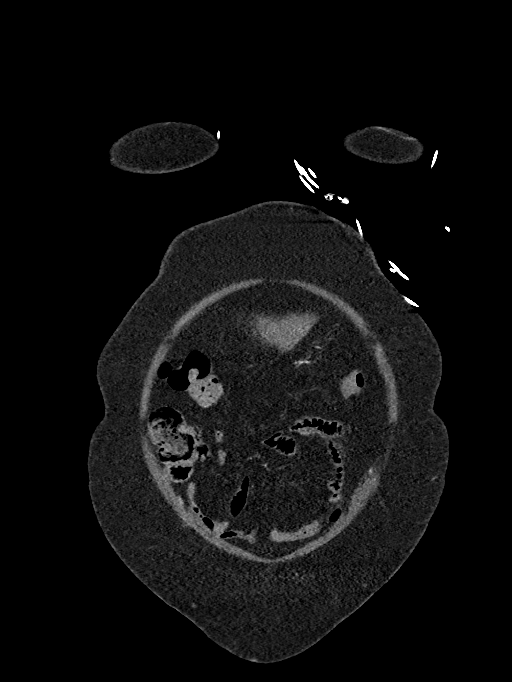
[im 106/211  soft-tissue]
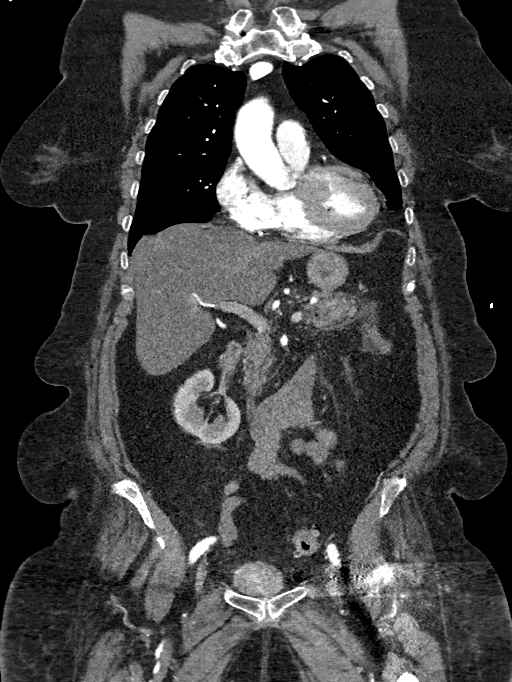
[im 158/211  soft-tissue]
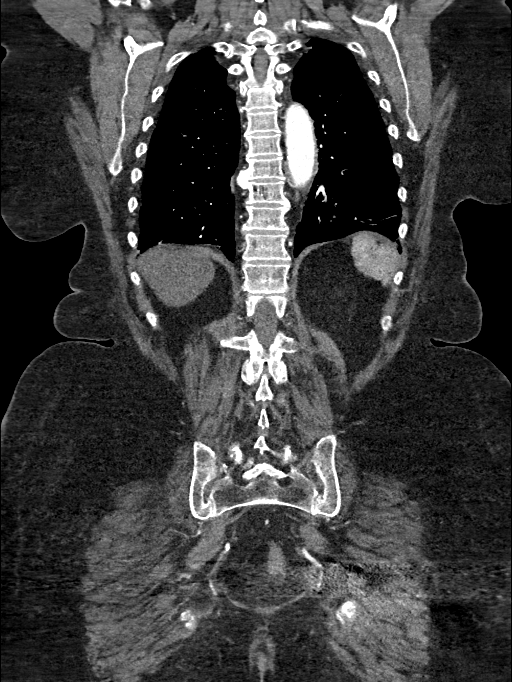

[14 of 46 positions shown; findings below may reference images not displayed]

Multidetector CT imaging through the chest, abdomen and pelvis was
performed using the standard protocol during bolus administration of
intravenous contrast. Multiplanar reconstructed images and MIPs were
obtained and reviewed to evaluate the vascular anatomy.

CONTRAST:  100mL OMNIPAQUE IOHEXOL 350 MG/ML SOLN
FINDINGS: CTA CHEST FINDINGS

Cardiovascular: Aortic calcifications are noted without evidence for
an aneurysm or dissection. The main pulmonary artery is dilated
measuring 3.5 cm in diameter. Coronary artery calcifications are
noted. There is no significant pericardial effusion. The heart size
is mildly enlarged.

Mediastinum/Nodes:

-- No mediastinal lymphadenopathy.

-- No hilar lymphadenopathy.

-- No axillary lymphadenopathy.

-- No supraclavicular lymphadenopathy.

--thyroid gland is not well visualized.

-  Unremarkable esophagus.

Lungs/Pleura: Scarring and atelectasis is noted at the lung bases.
There is no pneumothorax. The trachea is unremarkable.

Musculoskeletal: No chest wall abnormality. No bony spinal canal
stenosis.

Review of the MIP images confirms the above findings.

CTA ABDOMEN AND PELVIS FINDINGS

VASCULAR

Aorta: Normal caliber aorta without aneurysm, dissection, vasculitis
or significant stenosis.

Celiac: There is moderate narrowing at the origin of the celiac
axis.

SMA: Patent without evidence of aneurysm, dissection, vasculitis or
significant stenosis.

Renals: Both renal arteries are patent without evidence of aneurysm,
dissection, vasculitis, fibromuscular dysplasia or significant
stenosis.

IMA: Patent without evidence of aneurysm, dissection, vasculitis or
significant stenosis.

Inflow: Patent without evidence of aneurysm, dissection, vasculitis
or significant stenosis.

Veins: No obvious venous abnormality within the limitations of this
arterial phase study.

Review of the MIP images confirms the above findings.

NON-VASCULAR

Hepatobiliary: There is decreased hepatic attenuation suggestive of
hepatic steatosis. The liver surface is nodular. Normal
gallbladder.There is no biliary ductal dilation.

Pancreas: There is peripancreatic fat stranding, especially Desjardins the
pancreatic body and tail. The pancreas appears to be uniformly
enhancing. There is no well-formed drainable collection.

Spleen: Unremarkable.

Adrenals/Urinary Tract:

--Adrenal glands: Unremarkable.

--Right kidney/ureter: No hydronephrosis or radiopaque kidney
stones.

--Left kidney/ureter: No hydronephrosis or radiopaque kidney stones.

--Urinary bladder: Unremarkable.

Stomach/Bowel:

--Stomach/Duodenum: No hiatal hernia or other gastric abnormality.
Normal duodenal course and caliber.

--Small bowel: Unremarkable.

--Colon: Rectosigmoid diverticulosis without acute inflammation.

--Appendix: Surgically absent.

Vascular/Lymphatic: Atherosclerotic calcification is present within
the non-aneurysmal abdominal aorta, without hemodynamically
significant stenosis.

--No retroperitoneal lymphadenopathy.

--No mesenteric lymphadenopathy.

--No pelvic or inguinal lymphadenopathy.

Reproductive: Unremarkable

Other: No ascites or free air. The abdominal wall is normal.

Musculoskeletal. No acute displaced fractures.

Review of the MIP images confirms the above findings.
IMPRESSION: 1. No evidence of aortic dissection or aneurysm.
2. Peripancreatic fat stranding consistent with acute pancreatitis.
No well-formed drainable collection.
3. Hepatic steatosis with findings suspicious for underlying
cirrhosis.
4. Dilated main pulmonary artery which can be seen in patients with
elevated pulmonary artery pressures.
5. Rectosigmoid diverticulosis without acute inflammation.

Aortic Atherosclerosis ([id number]).

## 2022-06-28 IMAGING — DX DG CHEST 1V PORT
1 series · 1 of 1 positions shown · non-contrast
Comparison: 05/15/2019

CLINICAL DATA: Chest pain, shortness of breath

EXAM:
PORTABLE CHEST 1 VIEW

[chest]
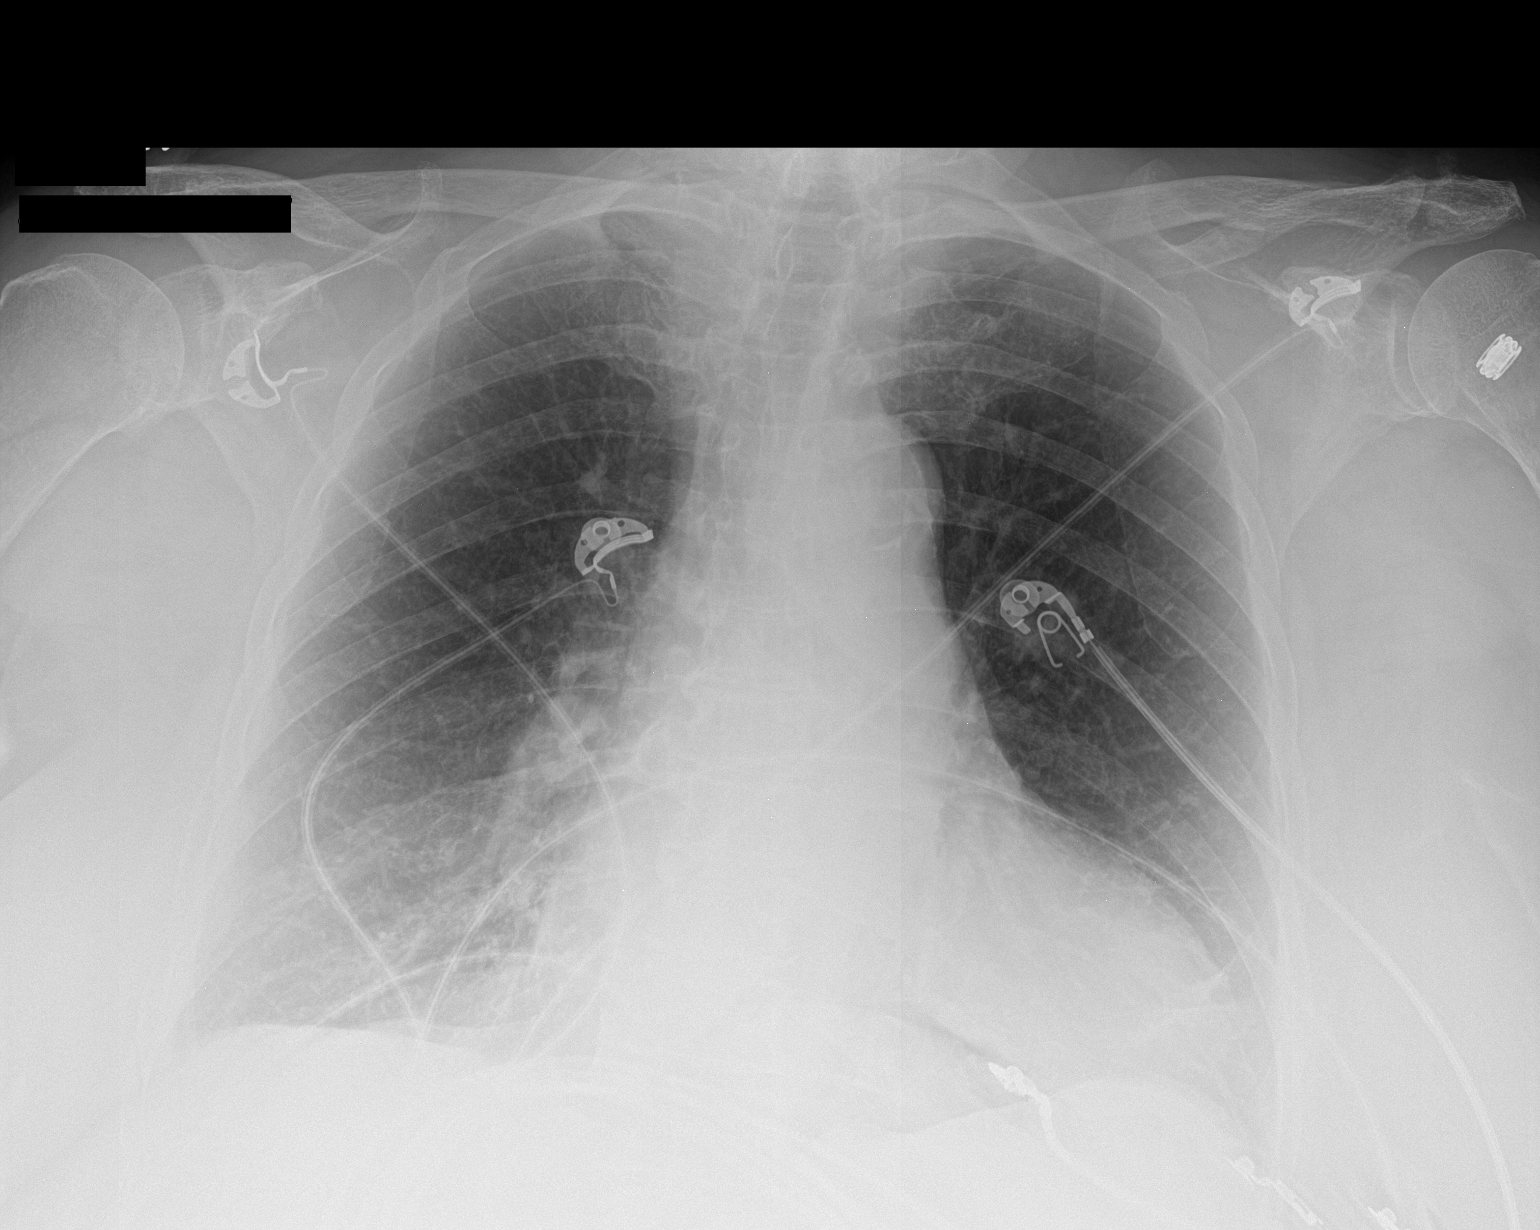

[1 of 1 positions shown; findings below may reference images not displayed]

FINDINGS: Underpenetrated examination. Cardiomegaly. No acute airspace
opacity. The visualized skeletal structures are unremarkable.
IMPRESSION: 1. Underpenetrated AP portable examination without acute abnormality
of the lungs.
2. Cardiomegaly.

## 2022-06-29 IMAGING — US US ABDOMEN LIMITED RUQ/ASCITES
1 series · 14 of 25 positions shown · non-contrast
Comparison: CT scan December 09, 2020

CLINICAL DATA: Abnormal CT of the liver

EXAM:
ULTRASOUND ABDOMEN LIMITED RIGHT UPPER QUADRANT

[Series 1: us abdomen limited ruq (liver/gb) · 14 of 49 slices shown]
[im 1/49]
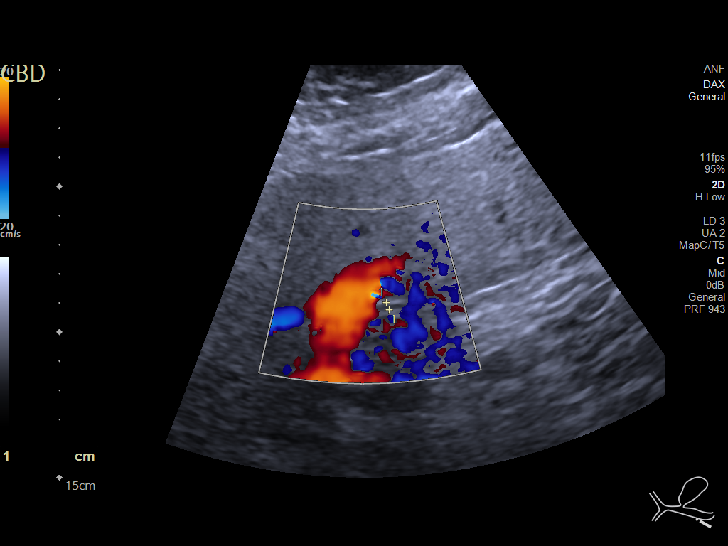
[im 5/49]
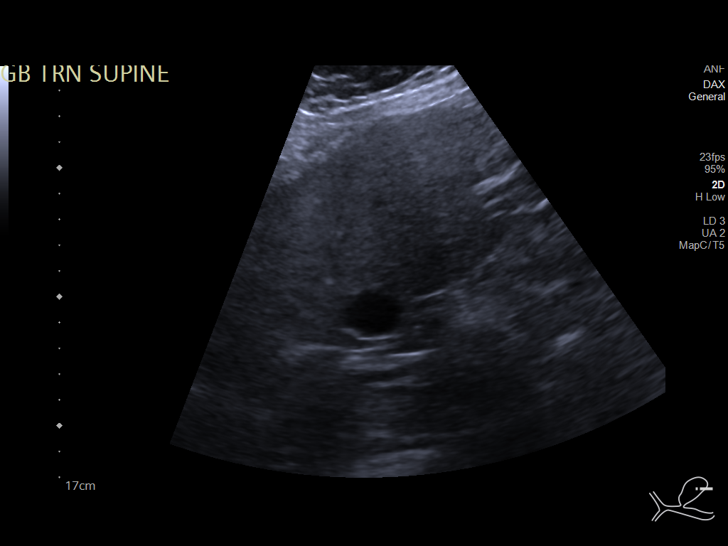
[im 9/49]
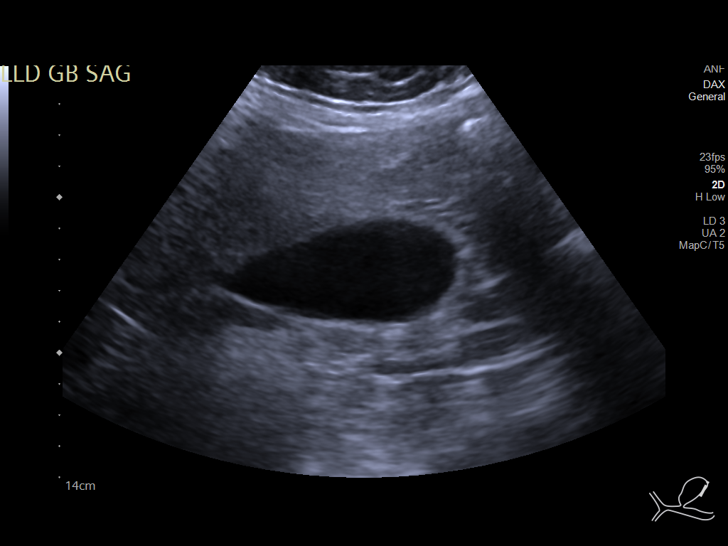
[im 13/49]
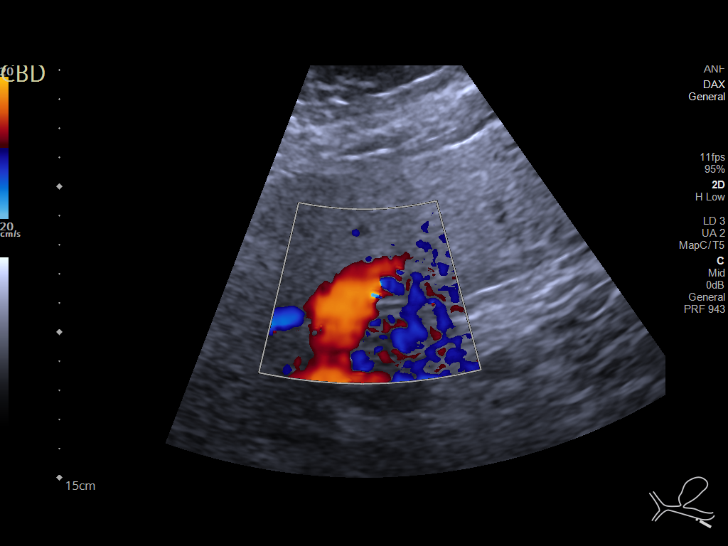
[im 17/49]
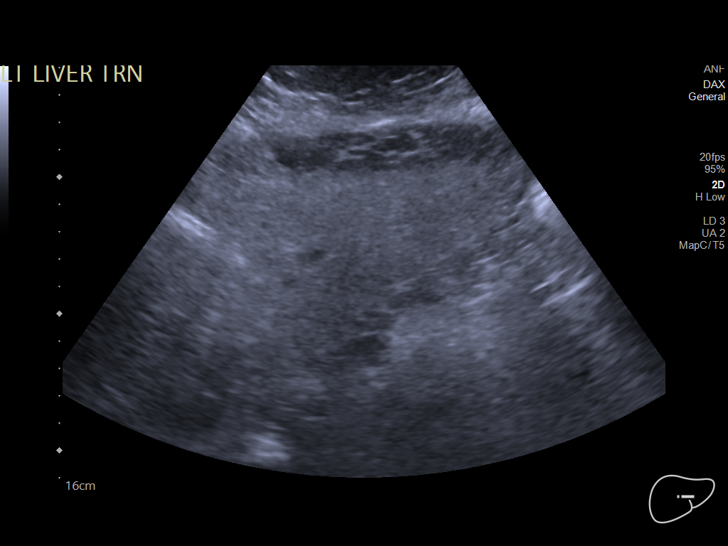
[im 19/49]
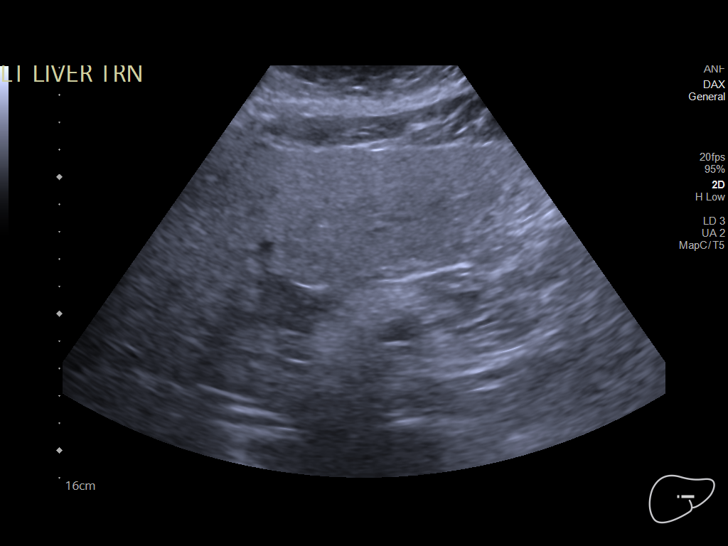
[im 23/49]
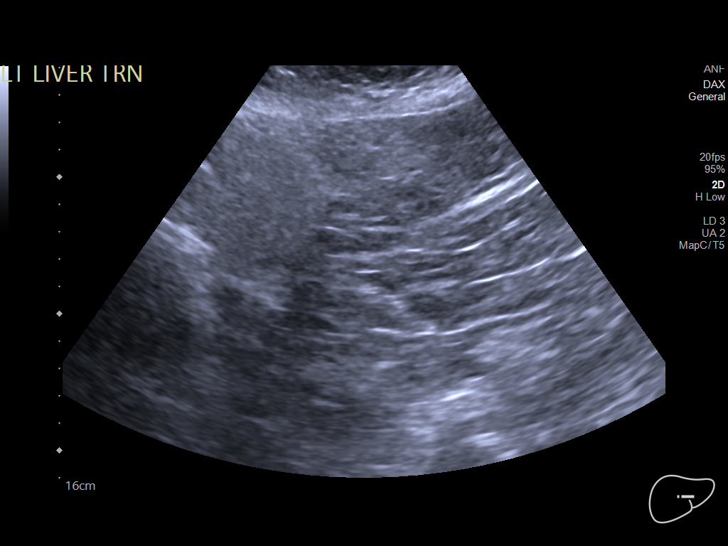
[im 27/49]
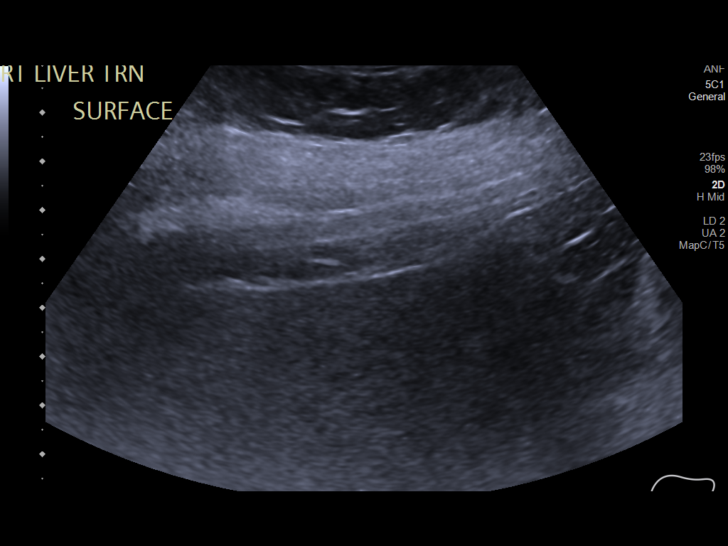
[im 31/49]
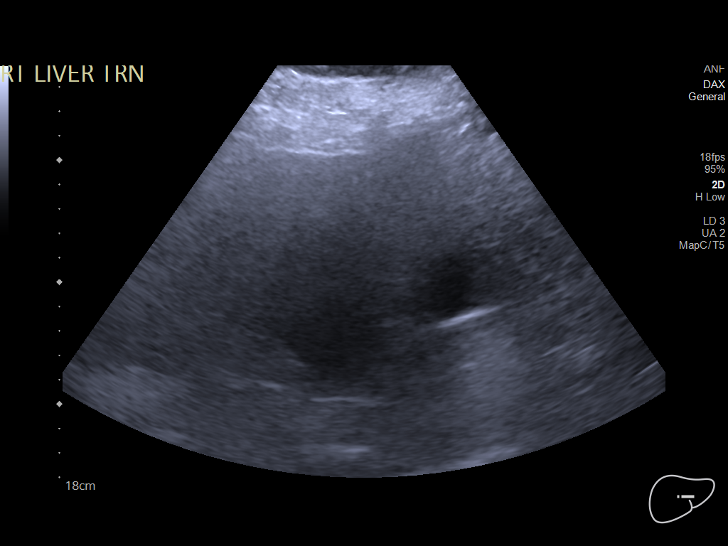
[im 33/49]
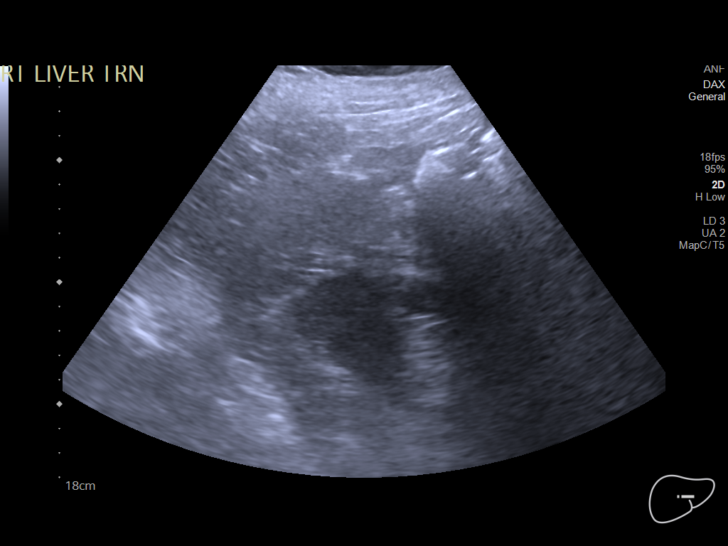
[im 37/49]
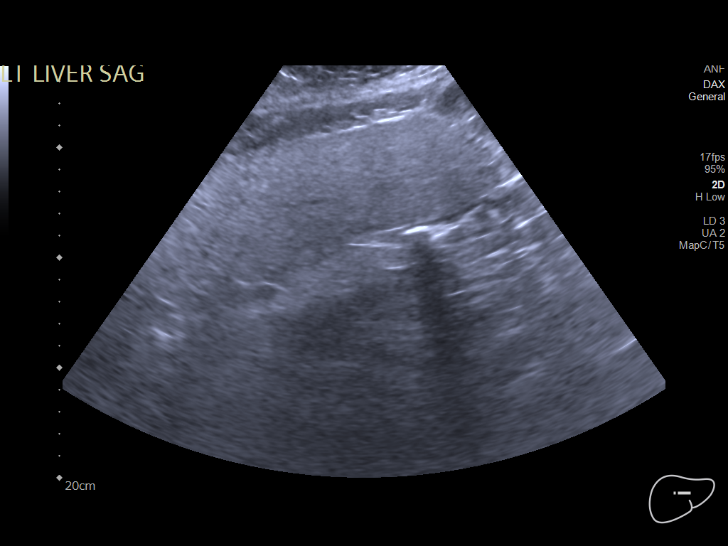
[im 41/49]
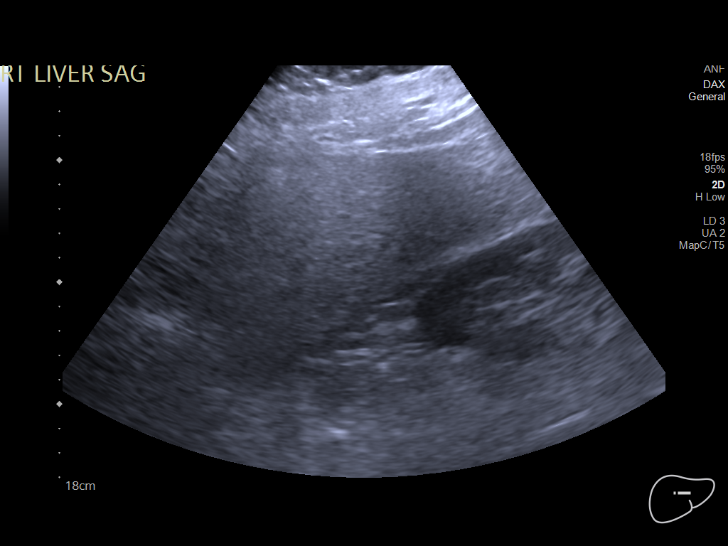
[im 45/49]
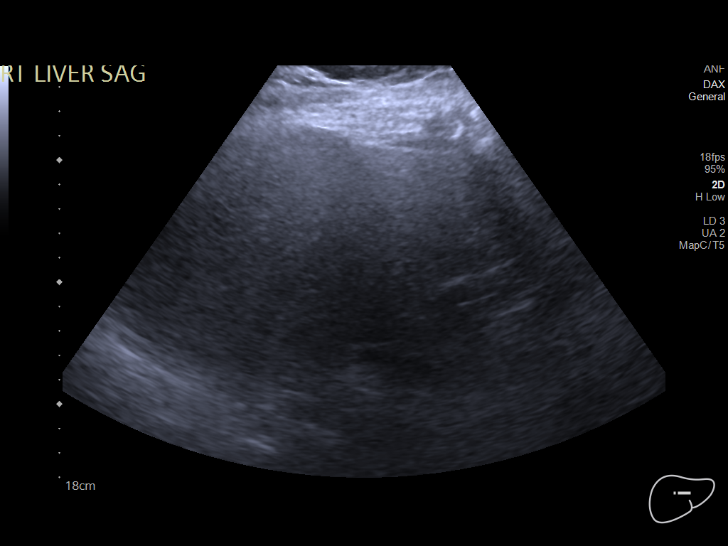
[im 49/49]
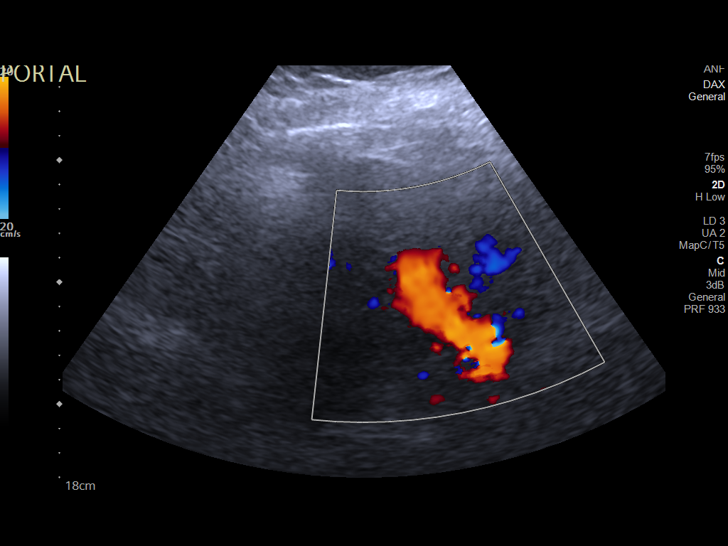

[14 of 25 positions shown; findings below may reference images not displayed]

FINDINGS: Gallbladder:

No gallstones or wall thickening visualized. No sonographic Murphy
sign noted by sonographer.

Common bile duct:

Diameter: 2.6 mm on limited imaging.

Liver:

Increased echogenicity in the liver. The liver demonstrates a
nodular contour on several images. Portal vein is patent on color
Doppler imaging with normal direction of blood flow towards the
liver.

Other: None.
IMPRESSION: 1. The liver demonstrates a nodular contour consistent with
cirrhosis.
2. No other abnormalities.

## 2022-08-14 ENCOUNTER — Ambulatory Visit: Payer: Medicare Other | Admitting: Podiatry

## 2022-09-03 ENCOUNTER — Ambulatory Visit: Payer: Medicare Other | Admitting: Podiatry

## 2022-09-03 DIAGNOSIS — Z7901 Long term (current) use of anticoagulants: Secondary | ICD-10-CM

## 2022-09-03 DIAGNOSIS — M79675 Pain in left toe(s): Secondary | ICD-10-CM | POA: Diagnosis not present

## 2022-09-03 DIAGNOSIS — L02611 Cutaneous abscess of right foot: Secondary | ICD-10-CM

## 2022-09-03 DIAGNOSIS — M79674 Pain in right toe(s): Secondary | ICD-10-CM

## 2022-09-03 DIAGNOSIS — L03031 Cellulitis of right toe: Secondary | ICD-10-CM

## 2022-09-03 DIAGNOSIS — B351 Tinea unguium: Secondary | ICD-10-CM

## 2022-09-03 MED ORDER — CEPHALEXIN 500 MG PO CAPS: 500.0000 mg | ORAL_CAPSULE | Freq: Three times a day (TID) | ORAL | 0 refills | Status: DC

## 2022-09-03 NOTE — Progress Notes (Signed)
Subjective: Chief Complaint  Patient presents with   Nail Problem    Routine foot care, nail trim, right hallux infection,  patient denies any pain,     86 year old female presents to the office with her family member today for the above concerns.  She does have the nails trimmed and is also swelling and redness to the right big toe.  No fever or chills.  No recent treatment.   Objective: AAO x3, NAD DP/PT pulses palpable bilaterally, CRT less than 3 seconds On the right second toe there is skin present there is mild edema and erythema present to the digit.   There is no drainage or pus today there is no fluctuance or crepitation.  There is no malodor.  Clear blister noted on the left second toe without any edema, erythema. Nails are hypertrophic, dystrophic, brittle, discolored, elongated 10. No surrounding redness or drainage. Tenderness nails 2-5 bilaterally. No open lesions or pre-ulcerative lesions are identified today. No pain with calf compression, swelling, warmth, erythema  Assessment: Symptomatic onychomycosis, cellulitis right hallux   Plan: -All treatment options discussed with the patient including all alternatives, risks, complications.  -For the rest I prescribed Keflex.  Recommend Epsom salt soaks daily.  Antibiotic ointment dressing changes daily. -I did treat the blister on the left second toe.  No signs of infection.  Antibiotic ointment. -Sharply debrided the nails x 8 without any complications or bleeding. -Monitor for any clinical signs or symptoms of infection and directed to call the office immediately should any occur or go to the ER.  Return in about 3 weeks (around 09/24/2022) for right big toe infection.  Vivi Barrack DPM

## 2022-09-13 ENCOUNTER — Emergency Department (HOSPITAL_COMMUNITY)
Admission: EM | Admit: 2022-09-13 | Discharge: 2022-09-13 | Disposition: A | Discharge status: Home / Self Care | Payer: Medicare Other | Attending: Emergency Medicine | Admitting: Emergency Medicine

## 2022-09-13 ENCOUNTER — Other Ambulatory Visit: Payer: Self-pay

## 2022-09-13 ENCOUNTER — Emergency Department (HOSPITAL_COMMUNITY): Payer: Medicare Other

## 2022-09-13 DIAGNOSIS — S99921A Unspecified injury of right foot, initial encounter: Secondary | ICD-10-CM | POA: Diagnosis present

## 2022-09-13 DIAGNOSIS — S9031XA Contusion of right foot, initial encounter: Secondary | ICD-10-CM | POA: Diagnosis not present

## 2022-09-13 DIAGNOSIS — J449 Chronic obstructive pulmonary disease, unspecified: Secondary | ICD-10-CM | POA: Insufficient documentation

## 2022-09-13 DIAGNOSIS — Z91040 Latex allergy status: Secondary | ICD-10-CM | POA: Insufficient documentation

## 2022-09-13 DIAGNOSIS — Y92410 Unspecified street and highway as the place of occurrence of the external cause: Secondary | ICD-10-CM | POA: Insufficient documentation

## 2022-09-13 DIAGNOSIS — Z7982 Long term (current) use of aspirin: Secondary | ICD-10-CM | POA: Diagnosis not present

## 2022-09-13 DIAGNOSIS — Z7901 Long term (current) use of anticoagulants: Secondary | ICD-10-CM | POA: Diagnosis not present

## 2022-09-13 LAB — CBC WITH DIFFERENTIAL/PLATELET
Abs Immature Granulocytes: 0.14 10*3/uL — ABNORMAL HIGH (ref 0.00–0.07)
Basophils Absolute: 0.1 10*3/uL (ref 0.0–0.1)
Basophils Relative: 0 %
Eosinophils Absolute: 0.1 10*3/uL (ref 0.0–0.5)
Eosinophils Relative: 1 %
HCT: 44.2 % (ref 36.0–46.0)
Hemoglobin: 14.4 g/dL (ref 12.0–15.0)
Immature Granulocytes: 1 %
Lymphocytes Relative: 5 %
Lymphs Abs: 0.6 10*3/uL — ABNORMAL LOW (ref 0.7–4.0)
MCH: 30.4 pg (ref 26.0–34.0)
MCHC: 32.6 g/dL (ref 30.0–36.0)
MCV: 93.2 fL (ref 80.0–100.0)
Monocytes Absolute: 0.7 10*3/uL (ref 0.1–1.0)
Monocytes Relative: 5 %
Neutro Abs: 11.4 10*3/uL — ABNORMAL HIGH (ref 1.7–7.7)
Neutrophils Relative %: 88 %
Platelets: 237 10*3/uL (ref 150–400)
RBC: 4.74 MIL/uL (ref 3.87–5.11)
RDW: 14.3 % (ref 11.5–15.5)
WBC: 13 10*3/uL — ABNORMAL HIGH (ref 4.0–10.5)
nRBC: 0 % (ref 0.0–0.2)

## 2022-09-13 LAB — BASIC METABOLIC PANEL
Anion gap: 10 (ref 5–15)
BUN: 14 mg/dL (ref 8–23)
CO2: 25 mmol/L (ref 22–32)
Calcium: 9.3 mg/dL (ref 8.9–10.3)
Chloride: 102 mmol/L (ref 98–111)
Creatinine, Ser: 1.14 mg/dL — ABNORMAL HIGH (ref 0.44–1.00)
GFR, Estimated: 47 mL/min — ABNORMAL LOW (ref >60)
Glucose, Bld: 159 mg/dL — ABNORMAL HIGH (ref 70–99)
Potassium: 4.7 mmol/L (ref 3.5–5.1)
Sodium: 137 mmol/L (ref 135–145)
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
## 2022-09-13 NOTE — ED Triage Notes (Signed)
Pedal doppler was positive
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
## 2022-09-13 NOTE — Discharge Instructions (Signed)
You can use an ACE bandage to help with compression. Apply ice intermittently and raise your legs as high as you can to help with swelling (ideally above the level of your heart).  If your pain or swelling worsens, you develop numbness or weakness, inability to bear weight, or any other new/concerning symptoms then return to the ER for evaluation.

## 2023-03-20 ENCOUNTER — Other Ambulatory Visit: Payer: Self-pay

## 2023-03-20 ENCOUNTER — Observation Stay (HOSPITAL_COMMUNITY)
Admission: EM | Admit: 2023-03-20 | Discharge: 2023-03-22 | Disposition: A | Discharge status: Home / Self Care | Payer: Medicare Other | Attending: Family Medicine | Admitting: Family Medicine

## 2023-03-20 ENCOUNTER — Encounter (HOSPITAL_COMMUNITY): Payer: Self-pay | Admitting: Emergency Medicine

## 2023-03-20 ENCOUNTER — Emergency Department (HOSPITAL_COMMUNITY): Payer: Medicare Other

## 2023-03-20 ENCOUNTER — Other Ambulatory Visit (HOSPITAL_COMMUNITY): Payer: Medicare Other

## 2023-03-20 ENCOUNTER — Observation Stay (HOSPITAL_COMMUNITY): Payer: Medicare Other

## 2023-03-20 DIAGNOSIS — R4781 Slurred speech: Secondary | ICD-10-CM | POA: Insufficient documentation

## 2023-03-20 DIAGNOSIS — Z96642 Presence of left artificial hip joint: Secondary | ICD-10-CM | POA: Diagnosis not present

## 2023-03-20 DIAGNOSIS — Z7901 Long term (current) use of anticoagulants: Secondary | ICD-10-CM | POA: Diagnosis not present

## 2023-03-20 DIAGNOSIS — Z86718 Personal history of other venous thrombosis and embolism: Secondary | ICD-10-CM | POA: Diagnosis not present

## 2023-03-20 DIAGNOSIS — Z79899 Other long term (current) drug therapy: Secondary | ICD-10-CM | POA: Insufficient documentation

## 2023-03-20 DIAGNOSIS — I639 Cerebral infarction, unspecified: Principal | ICD-10-CM | POA: Diagnosis present

## 2023-03-20 DIAGNOSIS — E039 Hypothyroidism, unspecified: Secondary | ICD-10-CM | POA: Insufficient documentation

## 2023-03-20 DIAGNOSIS — N1831 Chronic kidney disease, stage 3a: Secondary | ICD-10-CM | POA: Diagnosis present

## 2023-03-20 DIAGNOSIS — R29700 NIHSS score 0: Secondary | ICD-10-CM | POA: Diagnosis not present

## 2023-03-20 DIAGNOSIS — L8990 Pressure ulcer of unspecified site, unspecified stage: Secondary | ICD-10-CM | POA: Insufficient documentation

## 2023-03-20 DIAGNOSIS — J449 Chronic obstructive pulmonary disease, unspecified: Secondary | ICD-10-CM | POA: Insufficient documentation

## 2023-03-20 DIAGNOSIS — I480 Paroxysmal atrial fibrillation: Secondary | ICD-10-CM | POA: Insufficient documentation

## 2023-03-20 DIAGNOSIS — Z7982 Long term (current) use of aspirin: Secondary | ICD-10-CM | POA: Diagnosis not present

## 2023-03-20 DIAGNOSIS — G459 Transient cerebral ischemic attack, unspecified: Secondary | ICD-10-CM

## 2023-03-20 DIAGNOSIS — E785 Hyperlipidemia, unspecified: Secondary | ICD-10-CM | POA: Diagnosis present

## 2023-03-20 DIAGNOSIS — Z87891 Personal history of nicotine dependence: Secondary | ICD-10-CM | POA: Insufficient documentation

## 2023-03-20 DIAGNOSIS — I130 Hypertensive heart and chronic kidney disease with heart failure and stage 1 through stage 4 chronic kidney disease, or unspecified chronic kidney disease: Secondary | ICD-10-CM | POA: Diagnosis not present

## 2023-03-20 DIAGNOSIS — Z91040 Latex allergy status: Secondary | ICD-10-CM | POA: Insufficient documentation

## 2023-03-20 DIAGNOSIS — I4891 Unspecified atrial fibrillation: Secondary | ICD-10-CM | POA: Diagnosis present

## 2023-03-20 DIAGNOSIS — R4189 Other symptoms and signs involving cognitive functions and awareness: Secondary | ICD-10-CM

## 2023-03-20 DIAGNOSIS — N1832 Chronic kidney disease, stage 3b: Secondary | ICD-10-CM | POA: Insufficient documentation

## 2023-03-20 DIAGNOSIS — R531 Weakness: Secondary | ICD-10-CM | POA: Diagnosis present

## 2023-03-20 DIAGNOSIS — I5022 Chronic systolic (congestive) heart failure: Secondary | ICD-10-CM | POA: Insufficient documentation

## 2023-03-20 DIAGNOSIS — M069 Rheumatoid arthritis, unspecified: Secondary | ICD-10-CM | POA: Diagnosis present

## 2023-03-20 LAB — I-STAT CHEM 8, ED
BUN: 20 mg/dL (ref 8–23)
Calcium, Ion: 1.15 mmol/L (ref 1.15–1.40)
Chloride: 108 mmol/L (ref 98–111)
Creatinine, Ser: 1.2 mg/dL — ABNORMAL HIGH (ref 0.44–1.00)
Glucose, Bld: 221 mg/dL — ABNORMAL HIGH (ref 70–99)
HCT: 42 % (ref 36.0–46.0)
Hemoglobin: 14.3 g/dL (ref 12.0–15.0)
Potassium: 4 mmol/L (ref 3.5–5.1)
Sodium: 140 mmol/L (ref 135–145)
TCO2: 23 mmol/L (ref 22–32)

## 2023-03-20 LAB — RAPID URINE DRUG SCREEN, HOSP PERFORMED
Amphetamines: NOT DETECTED
Barbiturates: NOT DETECTED
Benzodiazepines: NOT DETECTED
Cocaine: NOT DETECTED
Opiates: NOT DETECTED
Tetrahydrocannabinol: NOT DETECTED

## 2023-03-20 LAB — URINALYSIS, ROUTINE W REFLEX MICROSCOPIC
Bilirubin Urine: NEGATIVE
Glucose, UA: NEGATIVE mg/dL
Hgb urine dipstick: NEGATIVE
Ketones, ur: NEGATIVE mg/dL
Leukocytes,Ua: NEGATIVE
Nitrite: NEGATIVE
Protein, ur: NEGATIVE mg/dL
Specific Gravity, Urine: 1.018 (ref 1.005–1.030)
pH: 5 (ref 5.0–8.0)

## 2023-03-20 LAB — DIFFERENTIAL
Abs Immature Granulocytes: 0.08 10*3/uL — ABNORMAL HIGH (ref 0.00–0.07)
Basophils Absolute: 0.1 10*3/uL (ref 0.0–0.1)
Basophils Relative: 1 %
Eosinophils Absolute: 0.2 10*3/uL (ref 0.0–0.5)
Eosinophils Relative: 2 %
Immature Granulocytes: 1 %
Lymphocytes Relative: 7 %
Lymphs Abs: 0.7 10*3/uL (ref 0.7–4.0)
Monocytes Absolute: 0.6 10*3/uL (ref 0.1–1.0)
Monocytes Relative: 6 %
Neutro Abs: 8.1 10*3/uL — ABNORMAL HIGH (ref 1.7–7.7)
Neutrophils Relative %: 83 %

## 2023-03-20 LAB — ETHANOL: Alcohol, Ethyl (B): <10 mg/dL (ref <10)

## 2023-03-20 LAB — COMPREHENSIVE METABOLIC PANEL
ALT: 21 U/L (ref 0–44)
AST: 16 U/L (ref 15–41)
Albumin: 3.1 g/dL — ABNORMAL LOW (ref 3.5–5.0)
Alkaline Phosphatase: 109 U/L (ref 38–126)
Anion gap: 11 (ref 5–15)
BUN: 19 mg/dL (ref 8–23)
CO2: 23 mmol/L (ref 22–32)
Calcium: 9.3 mg/dL (ref 8.9–10.3)
Chloride: 108 mmol/L (ref 98–111)
Creatinine, Ser: 1.24 mg/dL — ABNORMAL HIGH (ref 0.44–1.00)
GFR, Estimated: 43 mL/min — ABNORMAL LOW (ref >60)
Glucose, Bld: 218 mg/dL — ABNORMAL HIGH (ref 70–99)
Potassium: 4.3 mmol/L (ref 3.5–5.1)
Sodium: 142 mmol/L (ref 135–145)
Total Bilirubin: 0.3 mg/dL (ref 0.3–1.2)
Total Protein: 5.5 g/dL — ABNORMAL LOW (ref 6.5–8.1)

## 2023-03-20 LAB — CBC
HCT: 44.4 % (ref 36.0–46.0)
Hemoglobin: 13.6 g/dL (ref 12.0–15.0)
MCH: 28.6 pg (ref 26.0–34.0)
MCHC: 30.6 g/dL (ref 30.0–36.0)
MCV: 93.5 fL (ref 80.0–100.0)
Platelets: 204 10*3/uL (ref 150–400)
RBC: 4.75 MIL/uL (ref 3.87–5.11)
RDW: 14.6 % (ref 11.5–15.5)
WBC: 9.7 10*3/uL (ref 4.0–10.5)
nRBC: 0 % (ref 0.0–0.2)

## 2023-03-20 LAB — PROTIME-INR
INR: 1.1 (ref 0.8–1.2)
Prothrombin Time: 14.2 seconds (ref 11.4–15.2)

## 2023-03-20 LAB — HEMOGLOBIN A1C
Hgb A1c MFr Bld: 6.7 % — ABNORMAL HIGH (ref 4.8–5.6)
Mean Plasma Glucose: 145.59 mg/dL

## 2023-03-20 LAB — APTT: aPTT: 26 seconds (ref 24–36)

## 2023-03-20 MED ORDER — STROKE: EARLY STAGES OF RECOVERY BOOK
Freq: Once | Status: AC
  Filled 2023-03-20: qty 1

## 2023-03-20 MED ORDER — IPRATROPIUM-ALBUTEROL 0.5-2.5 (3) MG/3ML IN SOLN: 3.0000 mL | Freq: Four times a day (QID) | RESPIRATORY_TRACT | Status: DC | PRN

## 2023-03-20 MED ORDER — FOLIC ACID 1 MG PO TABS
1.0000 mg | ORAL_TABLET | Freq: Every day | ORAL | Status: DC
  Administered 2023-03-21 – 2023-03-22 (×2): 1 mg via ORAL
  Filled 2023-03-20 (×2): qty 1

## 2023-03-20 MED ORDER — PREDNISONE 5 MG PO TABS
5.0000 mg | ORAL_TABLET | Freq: Two times a day (BID) | ORAL | Status: DC
  Administered 2023-03-20 – 2023-03-22 (×4): 5 mg via ORAL
  Filled 2023-03-20 (×4): qty 1

## 2023-03-20 MED ORDER — MOMETASONE FURO-FORMOTEROL FUM 200-5 MCG/ACT IN AERO
2.0000 | INHALATION_SPRAY | Freq: Two times a day (BID) | RESPIRATORY_TRACT | Status: DC
  Administered 2023-03-21 (×2): 2 via RESPIRATORY_TRACT
  Filled 2023-03-20: qty 8.8

## 2023-03-20 MED ORDER — ALBUTEROL SULFATE HFA 108 (90 BASE) MCG/ACT IN AERS: 2.0000 | INHALATION_SPRAY | Freq: Four times a day (QID) | RESPIRATORY_TRACT | Status: DC | PRN

## 2023-03-20 MED ORDER — ACETAMINOPHEN 650 MG RE SUPP: 650.0000 mg | RECTAL | Status: DC | PRN

## 2023-03-20 MED ORDER — GUAIFENESIN ER 600 MG PO TB12
1200.0000 mg | ORAL_TABLET | Freq: Every evening | ORAL | Status: DC
  Administered 2023-03-21: 1200 mg via ORAL
  Filled 2023-03-20: qty 2

## 2023-03-20 MED ORDER — APIXABAN 5 MG PO TABS
5.0000 mg | ORAL_TABLET | Freq: Two times a day (BID) | ORAL | Status: DC
  Administered 2023-03-20 – 2023-03-22 (×4): 5 mg via ORAL
  Filled 2023-03-20 (×4): qty 1

## 2023-03-20 MED ORDER — UMECLIDINIUM BROMIDE 62.5 MCG/ACT IN AEPB
1.0000 | INHALATION_SPRAY | Freq: Every morning | RESPIRATORY_TRACT | Status: DC
  Administered 2023-03-21: 1 via RESPIRATORY_TRACT
  Filled 2023-03-20: qty 7

## 2023-03-20 MED ORDER — OXYCODONE HCL 5 MG PO TABS: 5.0000 mg | ORAL_TABLET | ORAL | Status: DC | PRN

## 2023-03-20 MED ORDER — ASPIRIN 81 MG PO TBEC
81.0000 mg | DELAYED_RELEASE_TABLET | Freq: Every day | ORAL | Status: DC
  Administered 2023-03-21 – 2023-03-22 (×2): 81 mg via ORAL
  Filled 2023-03-20 (×2): qty 1

## 2023-03-20 MED ORDER — NAPROXEN 250 MG PO TABS: 250.0000 mg | ORAL_TABLET | Freq: Two times a day (BID) | ORAL | Status: DC | PRN

## 2023-03-20 MED ORDER — LEVOTHYROXINE SODIUM 75 MCG PO TABS
150.0000 ug | ORAL_TABLET | Freq: Every day | ORAL | Status: DC
  Administered 2023-03-21 – 2023-03-22 (×2): 150 ug via ORAL
  Filled 2023-03-20 (×2): qty 2

## 2023-03-20 MED ORDER — LOSARTAN POTASSIUM 25 MG PO TABS: 12.5000 mg | ORAL_TABLET | Freq: Every morning | ORAL | Status: DC

## 2023-03-20 MED ORDER — BACID PO TABS
2.0000 | ORAL_TABLET | Freq: Every day | ORAL | Status: DC
  Filled 2023-03-20: qty 2

## 2023-03-20 MED ORDER — ACETAMINOPHEN 325 MG PO TABS
650.0000 mg | ORAL_TABLET | ORAL | Status: DC | PRN
  Administered 2023-03-21: 650 mg via ORAL
  Filled 2023-03-20: qty 2

## 2023-03-20 MED ORDER — FUROSEMIDE 20 MG PO TABS
20.0000 mg | ORAL_TABLET | Freq: Every day | ORAL | Status: DC
  Administered 2023-03-21 – 2023-03-22 (×2): 20 mg via ORAL
  Filled 2023-03-20 (×2): qty 1

## 2023-03-20 MED ORDER — LABETALOL HCL 5 MG/ML IV SOLN: 10.0000 mg | INTRAVENOUS | Status: DC | PRN

## 2023-03-20 MED ORDER — RISAQUAD PO CAPS: 1.0000 | ORAL_CAPSULE | Freq: Every day | ORAL | Status: DC

## 2023-03-20 MED ORDER — NAPROXEN SODIUM 220 MG PO TABS: 220.0000 mg | ORAL_TABLET | Freq: Two times a day (BID) | ORAL | Status: DC | PRN

## 2023-03-20 MED ORDER — PRAVASTATIN SODIUM 40 MG PO TABS
40.0000 mg | ORAL_TABLET | Freq: Every day | ORAL | Status: DC
  Administered 2023-03-20 – 2023-03-21 (×2): 40 mg via ORAL
  Filled 2023-03-20 (×2): qty 1

## 2023-03-20 MED ORDER — LEFLUNOMIDE 10 MG PO TABS
10.0000 mg | ORAL_TABLET | Freq: Every morning | ORAL | Status: DC
  Administered 2023-03-21 – 2023-03-22 (×2): 10 mg via ORAL
  Filled 2023-03-20 (×2): qty 1

## 2023-03-20 MED ORDER — SENNOSIDES-DOCUSATE SODIUM 8.6-50 MG PO TABS
1.0000 | ORAL_TABLET | Freq: Every evening | ORAL | Status: DC | PRN
  Administered 2023-03-21: 1 via ORAL
  Filled 2023-03-20: qty 1

## 2023-03-20 MED ORDER — ACETAMINOPHEN 160 MG/5ML PO SOLN: 650.0000 mg | ORAL | Status: DC | PRN

## 2023-03-20 MED ORDER — METOPROLOL SUCCINATE ER 25 MG PO TB24
50.0000 mg | ORAL_TABLET | Freq: Two times a day (BID) | ORAL | Status: DC
  Administered 2023-03-20 – 2023-03-22 (×4): 50 mg via ORAL
  Filled 2023-03-20 (×4): qty 2

## 2023-03-20 MED ORDER — ALBUTEROL SULFATE (2.5 MG/3ML) 0.083% IN NEBU: 2.5000 mg | INHALATION_SOLUTION | Freq: Four times a day (QID) | RESPIRATORY_TRACT | Status: DC | PRN

## 2023-03-20 NOTE — H&P (Signed)
History and Physical    Kristina May ZOX:[id number] DOB: May 03, 1936 DOA: 03/20/2023  PCP: Jettie Pagan, NP  Patient coming from: SNF  I have personally briefly reviewed patient's old medical records in [facility]  Chief Complaint: Slurred speech and confusion  HPI: Kristina May is a 87 y.o. female with medical history significant of COPD, paroxysmal atrial fibrillation on Eliquis, hypertension, hyperlipidemia, history of PE, chronic systolic congestive heart failure who lives at a nursing home was brought in to the emergency department due to episode of slurred speech and confusion.  According to the reports provided by the patient and patient's daughter at the bedside, patient was in her usual state of health when she slept last night but when she woke up this morning, she had above-mentioned symptoms but those symptoms lasted only 20 minutes however due to concern of stroke, she was presented to the emergency department.  Obviously, upon presentation, she was back at her baseline.  Patient endorses no other complaint.  During my evaluation, patient offers no symptoms.  ED Course: Upon presentation to ED, she was slightly hypertensive, labs were within normal range, CT head was done which was concerning for possible stroke so MRI was ordered and hospital service were consulted for admission.  Review of Systems: As per HPI otherwise negative.    Past Medical History:  Diagnosis Date   A-fib (HCC)    COPD (chronic obstructive pulmonary disease) (HCC)    Dyslipidemia    Hypertension    Pancreatitis    PE (pulmonary thromboembolism) (HCC)    Rheumatoid arthritis(714.0)    Shingles    Thyroid disease     Past Surgical History:  Procedure Laterality Date   APPENDECTOMY     FOOT SURGERY     TOTAL HIP ARTHROPLASTY Left      reports that she has quit smoking. She has never used smokeless tobacco. She reports current alcohol use. She reports that she does not use  drugs.  Allergies  Allergen Reactions   Betamethasone Hives   Dexamethasone Hives   Infliximab Rash and Other (See Comments)    Remicade = "She went to rehab because of it"   Latex Other (See Comments)    Causes blisters   Tape Other (See Comments)    Caused blisters    Tylenol [Acetaminophen] Other (See Comments)    Doesn't work for patient    Lisinopril     Other reaction(s): Unknown   Claritin [Loratadine] Other (See Comments)    Doesn't work for patient    Family History  Problem Relation Age of Onset   Multiple sclerosis Other     Prior to Admission medications   Medication Sig Start Date End Date Taking? Authorizing Provider  acidophilus (RISAQUAD) CAPS capsule Take 1 capsule by mouth daily. 05/26/19   Swayze, Ava, DO  albuterol (VENTOLIN HFA) 108 (90 Base) MCG/ACT inhaler Inhale 2 puffs into the lungs every 6 (six) hours as needed for wheezing or shortness of breath.    [provider]  apixaban (ELIQUIS) 5 MG TABS tablet Take 5 mg by mouth 2 (two) times daily.    [provider]  aspirin EC 81 MG tablet Take 81 mg by mouth in the morning.    [provider]  cephALEXin (KEFLEX) 500 MG capsule Take 1 capsule (500 mg total) by mouth 3 (three) times daily. 09/03/22   Vivi Barrack, DPM  Cholecalciferol (VITAMIN D3) 50 MCG (2000 UT) TABS Take 2,000 microcuries/1.47m2 by mouth  daily.    [provider]  ciclopirox (PENLAC) 8 % solution Apply topically at bedtime. Apply over nail and surrounding skin. Apply daily over previous coat. After seven (7) days, may remove with alcohol and continue cycle. 05/15/22   Vivi Barrack, DPM  fexofenadine (ALLEGRA) 180 MG tablet Take 180 mg by mouth every evening.    [provider]  Fluticasone-Salmeterol (ADVAIR) 250-50 MCG/DOSE AEPB Inhale 1 puff into the lungs 2 (two) times daily.    [provider]  folic acid (FOLVITE) 1 MG tablet Take 1 mg by mouth daily.    [provider]  furosemide (LASIX) 20 MG tablet Take 20 mg by mouth daily. 12/05/20   [provider]  guaiFENesin (MUCINEX) 600 MG 12 hr tablet Take 1,200 mg by mouth every evening.    [provider]  INCRUSE ELLIPTA 62.5 MCG/INH AEPB Inhale 1 puff into the lungs in the morning. 11/27/20   [provider]  ipratropium-albuterol (DUONEB) 0.5-2.5 (3) MG/3ML SOLN Take 3 mLs by nebulization every 6 (six) hours as needed (wheezing).     [provider]  leflunomide (ARAVA) 10 MG tablet Take 10 mg by mouth in the morning.    [provider]  levothyroxine (SYNTHROID) 150 MCG tablet Take 150 mcg by mouth daily before breakfast.     [provider]  losartan (COZAAR) 25 MG tablet Take 12.5 mg by mouth in the morning.    [provider]  metoprolol succinate (TOPROL-XL) 50 MG 24 hr tablet Take 50 mg by mouth 2 (two) times daily. Take with or immediately following a meal.    [provider]  miconazole (MICATIN) 2 % cream Apply 1 Application topically 2 (two) times daily. 04/21/22   Vivi Barrack, DPM  naproxen sodium (ALEVE) 220 MG tablet Take 220 mg by mouth 2 (two) times daily as needed (for pain).    [provider]  nystatin (MYCOSTATIN/NYSTOP) powder Apply topically 3 (three) times daily. Patient taking differently: Apply 1 application  topically 3 (three) times daily as needed (to affected sites). 05/26/19   Swayze, Ava, DO  oxyCODONE (OXY IR/ROXICODONE) 5 MG immediate release tablet Take 1 tablet (5 mg total) by mouth every 4 (four) hours as needed for moderate pain. 12/12/20   Sabino Dick, DO  potassium chloride SA (K-DUR) 20 MEQ tablet Take 2 tablets (40 mEq total) by mouth 2 (two) times daily. Patient taking differently: Take 20 mEq by mouth every evening. 05/26/19   Swayze, Ava, DO  pravastatin (PRAVACHOL) 40 MG tablet Take 40 mg by mouth at bedtime.    [provider]  predniSONE (DELTASONE) 5 MG  tablet Take 5 mg by mouth 2 (two) times daily with a meal.    [provider]    Physical Exam: Vitals:   03/20/23 1127 03/20/23 1134 03/20/23 1415 03/20/23 1515  BP:  (!) 158/94  (!) 188/96  Pulse:  65 63 63  Resp:  20 12 14   Temp:  98 F (36.7 C)  98 F (36.7 C)  TempSrc:  Oral  Oral  SpO2:  98% 95% 95%  Weight: 113.4 kg     Height: 5\' 6"  (1.676 m)       Constitutional: NAD, calm, comfortable Vitals:   03/20/23 1127 03/20/23 1134 03/20/23 1415 03/20/23 1515  BP:  (!) 158/94  (!) 188/96  Pulse:  65 63 63  Resp:  20 12 14   Temp:  98 F (36.7 C)  98  F (36.7 C)  TempSrc:  Oral  Oral  SpO2:  98% 95% 95%  Weight: 113.4 kg     Height: 5\' 6"  (1.676 m)      Eyes: PERRL, lids and conjunctivae normal, morbidly obese ENMT: Mucous membranes are moist. Posterior pharynx clear of any exudate or lesions.Normal dentition.  Neck: normal, supple, no masses, no thyromegaly Respiratory: clear to auscultation bilaterally, no wheezing, no crackles. Normal respiratory effort. No accessory muscle use.  Cardiovascular: Regular rate and rhythm, no murmurs / rubs / gallops.  +2 pitting edema, chronic. 2+ pedal pulses. No carotid bruits.  Abdomen: no tenderness, no masses palpated. No hepatosplenomegaly. Bowel sounds positive.  Musculoskeletal: no clubbing / cyanosis. No joint deformity upper and lower extremities. Good ROM, no contractures. Normal muscle tone.  Skin: no rashes, lesions, ulcers. No induration Neurologic: CN 2-12 grossly intact. Sensation intact, DTR normal. Strength 5/5 in all 4.  Psychiatric: Normal judgment and insight. Alert and oriented x 3. Normal mood.    Labs on Admission: I have personally reviewed following labs and imaging studies  CBC: Recent Labs  Lab 03/20/23 1134 03/20/23 1237  WBC 9.7  --   NEUTROABS 8.1*  --   HGB 13.6 14.3  HCT 44.4 42.0  MCV 93.5  --   PLT 204  --    Basic Metabolic Panel: Recent Labs  Lab 03/20/23 1134 03/20/23 1237   NA 142 140  K 4.3 4.0  CL 108 108  CO2 23  --   GLUCOSE 218* 221*  BUN 19 20  CREATININE 1.24* 1.20*  CALCIUM 9.3  --    GFR: Estimated Creatinine Clearance: 44.6 mL/min (A) (by C-G formula based on SCr of 1.2 mg/dL (H)). Liver Function Tests: Recent Labs  Lab 03/20/23 1134  AST 16  ALT 21  ALKPHOS 109  BILITOT 0.3  PROT 5.5*  ALBUMIN 3.1*   No results for input(s): "LIPASE", "AMYLASE" in the last 168 hours. No results for input(s): "AMMONIA" in the last 168 hours. Coagulation Profile: Recent Labs  Lab 03/20/23 1134  INR 1.1   Cardiac Enzymes: No results for input(s): "CKTOTAL", "CKMB", "CKMBINDEX", "TROPONINI" in the last 168 hours. BNP (last 3 results) No results for input(s): "PROBNP" in the last 8760 hours. HbA1C: No results for input(s): "HGBA1C" in the last 72 hours. CBG: No results for input(s): "GLUCAP" in the last 168 hours. Lipid Profile: No results for input(s): "CHOL", "HDL", "LDLCALC", "TRIG", "CHOLHDL", "LDLDIRECT" in the last 72 hours. Thyroid Function Tests: No results for input(s): "TSH", "T4TOTAL", "FREET4", "T3FREE", "THYROIDAB" in the last 72 hours. Anemia Panel: No results for input(s): "VITAMINB12", "FOLATE", "FERRITIN", "TIBC", "IRON", "RETICCTPCT" in the last 72 hours. Urine analysis:    Component Value Date/Time   COLORURINE YELLOW 03/20/2023 1233   APPEARANCEUR CLEAR 03/20/2023 1233   LABSPEC 1.018 03/20/2023 1233   PHURINE 5.0 03/20/2023 1233   GLUCOSEU NEGATIVE 03/20/2023 1233   HGBUR NEGATIVE 03/20/2023 1233   BILIRUBINUR NEGATIVE 03/20/2023 1233   KETONESUR NEGATIVE 03/20/2023 1233   PROTEINUR NEGATIVE 03/20/2023 1233   NITRITE NEGATIVE 03/20/2023 1233   LEUKOCYTESUR NEGATIVE 03/20/2023 1233    Radiological Exams on Admission: CT HEAD WO CONTRAST  Result Date: 03/20/2023 CLINICAL DATA:  Stroke, follow up EXAM: CT HEAD WITHOUT CONTRAST TECHNIQUE: Contiguous axial images were obtained from the base of the skull through the  vertex without intravenous contrast. RADIATION DOSE REDUCTION: This exam was performed according to the departmental dose-optimization program which includes automated exposure control, adjustment of the  mA and/or kV according to patient size and/or use of iterative reconstruction technique. COMPARISON:  None Available. FINDINGS: Brain: No evidence of acute infarction, hemorrhage, hydrocephalus, extra-axial collection or mass lesion/mass effect. Sequela of moderate chronic microvascular ischemic change with a possible age indeterminate infarcts in the centrum semiovale on the left (series 5, image 44). Vascular: No hyperdense vessel or unexpected calcification. Skull: Normal. Negative for fracture or focal lesion. Sinuses/Orbits: No middle ear or mastoid effusion. Paranasal sinuses are clear. Bilateral lens replacement. Orbits are otherwise. Other: None. IMPRESSION: Sequela of moderate chronic microvascular ischemic change with a possible age indeterminate infarcts in the centrum semiovale on the left. If there is concern for acute infarct, consider further evaluation with brain MRI. Electronically Signed   By: Lorenza Cambridge M.D.   On: 03/20/2023 14:07    EKG: Independently reviewed.  Sinus rhythm  Assessment/Plan Principal Problem:   TIA (transient ischemic attack)   Strokelike symtoms: Patient has no symptoms and no signs at the moment.  She is back at her baseline. -admit forTelemetry monitoring -Stroke protocol -Allow for permissive hypertension for the first 24-48h - only treat PRN if SBP >220 mmHg or diastolic blood pressure >120. Blood pressures can be gradually normalized to SBP<140 upon discharge. -MRI brain without contrast and MRA head and neck  -Maintain Euthermia.  -ASA not given because patient is already on Eliquis. -Echocardiogram to- rule out PFO -Lipid Panel, TSH and A1C -Frequent neuro checks -Atorvastatin PO within 24 hrs.  -Risk factor modification -Consult Neurology -PT/OT  eval, Speech consult  COPD: Stable.  Resume home medications.  Hyperlipidemia: Resume statin.  Rheumatoid arthritis: Resume prednisone.  Acquired hypothyroidism: Resume Synthroid.  Paroxysmal atrial fibrillation: Rates controlled.  Resume Toprol-XL and Eliquis.  History of systolic congestive heart failure: Appears euvolemic.  Has chronic lower extremity edema.  Resume home dose of Lasix.  CKD stage IIIb: At baseline.  DVT prophylaxis: Eliquis Code Status: DNR-confirmed with patient and the daughter at the bedside Family Communication: Daughter and son-in-law present at bedside.  Plan of care discussed with patient in length and he verbalized understanding and agreed with it. Disposition Plan: Potential discharge tomorrow Consults called: None  Hughie Closs MD [facility]  *Please note that this is a verbal dictation therefore any spelling or grammatical errors are due to the "[employer] One" system interpretation.  Please page via Amion and do not message via secure chat for urgent patient care matters. Secure chat can be used for non urgent patient care matters. 03/20/2023, 4:27 PM  To contact the attending provider between 7A-7P or the covering provider during after hours 7P-7A, please log into the web site [email or web address]

## 2023-03-20 NOTE — ED Triage Notes (Signed)
Pt BIB [facility] from [facility] SNF due to gargled speech that started at 0800 this morning 03/20/23.  Facility reports it lasted about 15 minutes.  20g left AC.  VS BP 180/112, CBG 204, SpO2 97% RA

## 2023-03-20 NOTE — ED Notes (Signed)
Patient transported to CT 

## 2023-03-20 NOTE — ED Provider Notes (Signed)
[place] EMERGENCY DEPARTMENT AT [facility] Provider Note   CSN: [id number] Arrival date & time: 03/20/23  1123     History  No chief complaint on file.   Kristina May is a 87 y.o. female with HTN, hypothyroidism, rheumatoid arthritis, CKD stage 3, afib, HFpEF, COPD, QT prolongation, history of pancreatitis, hypokalemia, MDD who  presents with daughter and son-in-law who provide additional history..  wn normal was when she went to sleep last night. Facility reports it lasted about 15 minutes.  Patient reports that she does remember the event and she was asking about her bed, however patient has slept in a lift chair for more than 10 years, so this did not make any sense.  Patient fell after losing her balance with her rollator 1 week ago and did hit her head on the floor, did not lose consciousness, but was not evaluated after this incident.  She states she is not hurting anywhere as result of that fall.  She complains of no fevers chills, chest pain, shortness of breath, nausea vomiting diarrhea constipation, urinary symptoms, numbness tingling, asymmetric weakness.  She is urinary incontinence at baseline and wears a diaper.  She has bilateral lower extremity lymphedema/swelling with some tenderness that is at her baseline, not any worse than normal. No recent changes to her meds. Right now she feels normal. However, her daughter reports that she may have had some additional confusion over the course of the day today which is abnormal for her.  20g left AC. VS BP 180/112, CBG 204, SpO2 97% RA   HPI     Home Medications Prior to Admission medications   Medication Sig Start Date End Date Taking? Authorizing Provider  albuterol (VENTOLIN HFA) 108 (90 Base) MCG/ACT inhaler Inhale 2 puffs into the lungs every 6 (six) hours as needed for wheezing or shortness of breath.   Yes [provider]  apixaban (ELIQUIS) 5 MG TABS tablet Take 5 mg by mouth 2 (two) times daily.   Yes  [provider]  aspirin EC 81 MG tablet Take 81 mg by mouth daily.   Yes [provider]  Calcium Carb-Cholecalciferol (CALCIUM 600/VITAMIN D3) 600-20 MG-MCG TABS Take 1 tablet by mouth at bedtime.   Yes [provider]  calcium carbonate (OS-CAL - DOSED IN MG OF ELEMENTAL CALCIUM) 1250 (500 Ca) MG tablet Take 500 mg of elemental calcium by mouth at bedtime.   Yes [provider]  calcium carbonate (TUMS EX) 750 MG chewable tablet Chew 750 mg by mouth every 12 (twelve) hours as needed for heartburn.   Yes [provider]  CARBOXYMETHYLCELLULOSE SODIUM OP Place 1 drop into both eyes every 8 (eight) hours as needed (dry eyes).   Yes [provider]  Cholecalciferol (VITAMIN D3) 50 MCG (2000 UT) TABS Take 2,000 Units by mouth daily.   Yes [provider]  DULoxetine (CYMBALTA) 30 MG capsule Take 30 mg by mouth at bedtime.   Yes [provider]  fexofenadine (ALLEGRA) 180 MG tablet Take 180 mg by mouth at bedtime.   Yes [provider]  Fluticasone-Salmeterol (ADVAIR) 250-50 MCG/DOSE AEPB Inhale 1 puff into the lungs 2 (two) times daily.   Yes [provider]  folic acid (FOLVITE) 1 MG tablet Take 1 mg by mouth daily.   Yes [provider]  furosemide (LASIX) 20 MG tablet Take 20 mg by mouth daily. 12/05/20  Yes [provider]  Guaifenesin (MUCINEX MAXIMUM STRENGTH) 1200 MG TB12 Take  1,200 mg by mouth every 12 (twelve) hours as needed (cough, congestion).   Yes [provider]  guaiFENesin (MUCINEX) 600 MG 12 hr tablet Take 600 mg by mouth every 12 (twelve) hours.   Yes [provider]  INCRUSE ELLIPTA 62.5 MCG/INH AEPB Inhale 1 puff into the lungs daily. 11/27/20  Yes [provider]  ipratropium-albuterol (DUONEB) 0.5-2.5 (3) MG/3ML SOLN Take 3 mLs by nebulization every 6 (six) hours as needed (wheezing).    Yes [provider]  LACTOBACILLUS PO Take 1 tablet by  mouth daily.   Yes [provider]  leflunomide (ARAVA) 10 MG tablet Take 10 mg by mouth daily.   Yes [provider]  levothyroxine (SYNTHROID) 150 MCG tablet Take 150 mcg by mouth daily.   Yes [provider]  loperamide (IMODIUM A-D) 2 MG tablet Take 2 mg by mouth every 8 (eight) hours as needed for diarrhea or loose stools.   Yes [provider]  losartan (COZAAR) 25 MG tablet Take 25 mg by mouth daily.   Yes [provider]  metoprolol tartrate (LOPRESSOR) 50 MG tablet Take 50 mg by mouth 2 (two) times daily.   Yes [provider]  naproxen sodium (ALEVE) 220 MG tablet Take 220 mg by mouth See admin instructions. 2 entries on MAR : 1) 220 mg once daily every morning 0900 2) 220 mg every 8 hours as needed for pain (must be 8 hours after scheduled dose)   Yes [provider]  ondansetron (ZOFRAN) 4 MG tablet Take 4 mg by mouth every 8 (eight) hours as needed for nausea or vomiting.   Yes [provider]  potassium chloride SA (K-DUR) 20 MEQ tablet Take 2 tablets (40 mEq total) by mouth 2 (two) times daily. Patient taking differently: Take 20 mEq by mouth daily. 05/26/19  Yes Swayze, Ava, DO  pravastatin (PRAVACHOL) 40 MG tablet Take 40 mg by mouth at bedtime.   Yes [provider]  predniSONE (DELTASONE) 5 MG tablet Take 5 mg by mouth 2 (two) times daily.   Yes [provider]  acidophilus (RISAQUAD) CAPS capsule Take 1 capsule by mouth daily. Patient not taking: Reported on 03/20/2023 05/26/19   Swayze, Ava, DO      Allergies    Celestone [betamethasone], Decadron [dexamethasone], Latex, Remicade [infliximab], Tape, Tylenol [acetaminophen], Z-pak [azithromycin], Zestril [lisinopril], Claritin [loratadine], and Wound dressing adhesive    Review of Systems   Review of Systems A 10 point review of systems was performed and is negative unless otherwise reported in HPI.  Physical Exam Updated Vital  Signs BP (!) 143/125 (BP Location: Right Arm)   Pulse 67   Temp 98 F (36.7 C) (Oral)   Resp 16   Ht 5\' 6"  (1.676 m)   Wt 113.4 kg   SpO2 94%   BMI 40.35 kg/m  Physical Exam General: Normal appearing female, lying in bed.  HEENT: PERRLA, EOMI, Sclera anicteric, MMM, trachea midline.  Cardiology: RRR, no murmurs/rubs/gallops. BL radial and DP pulses equal bilaterally.  Resp: Normal respiratory rate and effort. CTAB, no wheezes, rhonchi, crackles.  Abd: Soft, non-tender, non-distended. No rebound tenderness or guarding.  GU: Deferred. MSK: 3+ nonpitting edema bilateralNo peripheral edema or signs of trauma. Extremities without deformity or TTP. No cyanosis or clubbing. Skin: warm, dry.  Neuro: A&Ox4, CNs II-XII grossly intact. MAEs. Sensation grossly intact.  Psych: Normal mood and affect.   ED Results / Procedures / Treatments   Labs (all labs ordered  are listed, but only abnormal results are displayed) Labs Reviewed  DIFFERENTIAL - Abnormal; Notable for the following components:      Result Value   Neutro Abs 8.1 (*)    Abs Immature Granulocytes 0.08 (*)    All other components within normal limits  COMPREHENSIVE METABOLIC PANEL - Abnormal; Notable for the following components:   Glucose, Bld 218 (*)    Creatinine, Ser 1.24 (*)    Total Protein 5.5 (*)    Albumin 3.1 (*)    GFR, Estimated 43 (*)    All other components within normal limits  I-STAT CHEM 8, ED - Abnormal; Notable for the following components:   Creatinine, Ser 1.20 (*)    Glucose, Bld 221 (*)    All other components within normal limits  ETHANOL  PROTIME-INR  APTT  CBC  RAPID URINE DRUG SCREEN, HOSP PERFORMED  URINALYSIS, ROUTINE W REFLEX MICROSCOPIC  HEMOGLOBIN A1C  LIPID PANEL    EKG EKG Interpretation  Date/Time:  Wednesday March 20 2023 11:32:58 EDT Ventricular Rate:  71 PR Interval:  236 QRS Duration: 101 QT Interval:  412 QTC Calculation: 448 R Axis:   -46 Text Interpretation: Sinus  rhythm Ventricular premature complex Prolonged PR interval Left anterior fascicular block Abnormal R-wave progression, late transition Left ventricular hypertrophy Nonspecific T abnormalities, lateral leads No significant change since last tracing Confirmed by Alvira Monday ([id number]) on 03/20/2023 3:14:17 PM  Radiology CT HEAD WO CONTRAST  Result Date: 03/20/2023 CLINICAL DATA:  Stroke, follow up EXAM: CT HEAD WITHOUT CONTRAST TECHNIQUE: Contiguous axial images were obtained from the base of the skull through the vertex without intravenous contrast. RADIATION DOSE REDUCTION: This exam was performed according to the departmental dose-optimization program which includes automated exposure control, adjustment of the mA and/or kV according to patient size and/or use of iterative reconstruction technique. COMPARISON:  None Available. FINDINGS: Brain: No evidence of acute infarction, hemorrhage, hydrocephalus, extra-axial collection or mass lesion/mass effect. Sequela of moderate chronic microvascular ischemic change with a possible age indeterminate infarcts in the centrum semiovale on the left (series 5, image 44). Vascular: No hyperdense vessel or unexpected calcification. Skull: Normal. Negative for fracture or focal lesion. Sinuses/Orbits: No middle ear or mastoid effusion. Paranasal sinuses are clear. Bilateral lens replacement. Orbits are otherwise. Other: None. IMPRESSION: Sequela of moderate chronic microvascular ischemic change with a possible age indeterminate infarcts in the centrum semiovale on the left. If there is concern for acute infarct, consider further evaluation with brain MRI. Electronically Signed   By: Lorenza Cambridge M.D.   On: 03/20/2023 14:07    Procedures Procedures    Medications Ordered in ED Medications  aspirin EC tablet 81 mg (has no administration in time range)  leflunomide (ARAVA) tablet 10 mg (has no administration in time range)  naproxen sodium (ALEVE) tablet 220 mg (has  no administration in time range)  oxyCODONE (Oxy IR/ROXICODONE) immediate release tablet 5 mg (has no administration in time range)  furosemide (LASIX) tablet 20 mg (has no administration in time range)  metoprolol succinate (TOPROL-XL) 24 hr tablet 50 mg (has no administration in time range)  pravastatin (PRAVACHOL) tablet 40 mg (has no administration in time range)  levothyroxine (SYNTHROID) tablet 150 mcg (has no administration in time range)  predniSONE (DELTASONE) tablet 5 mg (has no administration in time range)  acidophilus (RISAQUAD) capsule 1 capsule (has no administration in time range)  apixaban (ELIQUIS) tablet 5 mg (has no administration in time range)  folic acid (FOLVITE) tablet  1 mg (has no administration in time range)  albuterol (VENTOLIN HFA) 108 (90 Base) MCG/ACT inhaler 2 puff (has no administration in time range)  mometasone-formoterol (DULERA) 200-5 MCG/ACT inhaler 2 puff (has no administration in time range)  guaiFENesin (MUCINEX) 12 hr tablet 1,200 mg (has no administration in time range)  umeclidinium bromide (INCRUSE ELLIPTA) 62.5 MCG/ACT 1 puff (has no administration in time range)  ipratropium-albuterol (DUONEB) 0.5-2.5 (3) MG/3ML nebulizer solution 3 mL (has no administration in time range)   stroke: early stages of recovery book (has no administration in time range)  acetaminophen (TYLENOL) tablet 650 mg (has no administration in time range)    Or  acetaminophen (TYLENOL) 160 MG/5ML solution 650 mg (has no administration in time range)    Or  acetaminophen (TYLENOL) suppository 650 mg (has no administration in time range)  senna-docusate (Senokot-S) tablet 1 tablet (has no administration in time range)  labetalol (NORMODYNE) injection 10 mg (has no administration in time range)    ED Course/ Medical Decision Making/ A&P                          Medical Decision Making Amount and/or Complexity of Data Reviewed Labs: ordered. Decision-making details  documented in ED Course. Radiology: ordered. Decision-making details documented in ED Course.  Risk Decision regarding hospitalization.    This patient presents to the ED for concern of episode of confusion/AMS/slurred speech, this involves an extensive number of treatment options, and is a complaint that carries with it a high risk of complications and morbidity.  I considered the following differential and admission for this acute, potentially life threatening condition.   MDM:    Patient with acute onset of confusion, garbled speech that lasted approximately 20 minutes per facility.  This is concerning for a TIA.  Patient is reportedly feeling well and has no slurred speech at this time, no focal neurodeficits, however daughter reports possible ongoing intermittent confusion which is abnormal for her.  Consider DDx of altered mental status/slurred speech which could include intracranial abnormalities such as ICH, hydrocephalus, and given patient's recent trauma will obtain CT head, she does take Eliquis.  She has no headache or focal neurodeficits at this time.  Consider infection such as UTI or pneumonia, though patient reports no infectious symptoms or fever/chills.  No recent changes to her medications, and no significantly sedating meds noted in her med list.  Consider electrolyte abnormalities, hypo-/hyperglycemic episode.  She has no chest pain or shortness of breath indicate ACS or arrhythmia, and EKG here is without ischemia or arrhythmia.  If workup is reassuring will likely be admitting patient for TIA workup.   Clinical Course as of 03/20/23 1759  Wed Mar 20, 2023  1514 Alcohol, Ethyl (B): <10 [HN]  1514 INR: 1.1 wnl [HN]  1514 CBC wnl [HN]  1516 Glucose(!): 218 Mild hyperglycemia [HN]  1517 CT HEAD WO CONTRAST Sequela of moderate chronic microvascular ischemic change with a possible age indeterminate infarcts in the centrum semiovale on the left. If there is concern for acute  infarct, consider further evaluation with brain MRI.   [HN]  1533 Urinalysis, Routine w reflex microscopic -Urine, Clean Catch No UTI [HN]    Clinical Course User Index [HN] Loetta Rough, MD    Labs: I Ordered, and personally interpreted labs.  The pertinent results include: Those listed above  Imaging Studies ordered: Imaging is ordered from triage including CT head I independently visualized and interpreted  imaging. I agree with the radiologist interpretation  Additional history obtained from daughter and son-in-law at bedside, chart review.    Cardiac Monitoring: The patient was maintained on a cardiac monitor.  I personally viewed and interpreted the cardiac monitored which showed an underlying rhythm of: Normal sinus rhythm  Reevaluation: I reevaluated the patient and found that they have :improved  Social Determinants of Health:  patient lives at [facility]  Disposition: Admit for TIA workup  Co morbidities that complicate the patient evaluation  Past Medical History:  Diagnosis Date   A-fib (HCC)    COPD (chronic obstructive pulmonary disease) (HCC)    Dyslipidemia    Hypertension    Pancreatitis    PE (pulmonary thromboembolism) (HCC)    Rheumatoid arthritis(714.0)    Shingles    Thyroid disease      Medicines Meds ordered this encounter  Medications   aspirin EC tablet 81 mg   leflunomide (ARAVA) tablet 10 mg   naproxen sodium (ALEVE) tablet 220 mg   oxyCODONE (Oxy IR/ROXICODONE) immediate release tablet 5 mg   furosemide (LASIX) tablet 20 mg   DISCONTD: losartan (COZAAR) tablet 12.5 mg   metoprolol succinate (TOPROL-XL) 24 hr tablet 50 mg    Take with or immediately following a meal.     pravastatin (PRAVACHOL) tablet 40 mg   levothyroxine (SYNTHROID) tablet 150 mcg   predniSONE (DELTASONE) tablet 5 mg   acidophilus (RISAQUAD) capsule 1 capsule   apixaban (ELIQUIS) tablet 5 mg   folic acid (FOLVITE) tablet 1 mg   albuterol (VENTOLIN HFA)  108 (90 Base) MCG/ACT inhaler 2 puff   mometasone-formoterol (DULERA) 200-5 MCG/ACT inhaler 2 puff   guaiFENesin (MUCINEX) 12 hr tablet 1,200 mg   umeclidinium bromide (INCRUSE ELLIPTA) 62.5 MCG/ACT 1 puff   ipratropium-albuterol (DUONEB) 0.5-2.5 (3) MG/3ML nebulizer solution 3 mL    stroke: early stages of recovery book   OR Linked Order Group    acetaminophen (TYLENOL) tablet 650 mg    acetaminophen (TYLENOL) 160 MG/5ML solution 650 mg    acetaminophen (TYLENOL) suppository 650 mg   senna-docusate (Senokot-S) tablet 1 tablet   labetalol (NORMODYNE) injection 10 mg    I have reviewed the patients home medicines and have made adjustments as needed  Problem List / ED Course: Problem List Items Addressed This Visit       Cardiovascular and Mediastinum   * (Principal) TIA (transient ischemic attack) - Primary   Relevant Medications   aspirin EC tablet 81 mg   furosemide (LASIX) tablet 20 mg   metoprolol succinate (TOPROL-XL) 24 hr tablet 50 mg (Start on 03/20/2023 10:00 PM)   pravastatin (PRAVACHOL) tablet 40 mg (Start on 03/20/2023 10:00 PM)   apixaban (ELIQUIS) tablet 5 mg (Start on 03/20/2023 10:00 PM)   labetalol (NORMODYNE) injection 10 mg   metoprolol tartrate (LOPRESSOR) 50 MG tablet                This note was created using dictation software, which may contain spelling or grammatical errors.    Loetta Rough, MD 03/20/23 [phone]

## 2023-03-20 NOTE — ED Notes (Addendum)
ED TO INPATIENT HANDOFF REPORT  ED Nurse Name and Phone #: Keshona Kartes [phone]  S Name/Age/Gender Kristina May 87 y.o. female Room/Bed: 031C/031C  Code Status   Code Status: Prior  Home/SNF/Other Home Patient oriented to: self, place, time, and situation Is this baseline? Yes   Triage Complete: Triage complete  Chief Complaint TIA (transient ischemic attack) [G45.9]  Triage Note Pt BIB [facility] from [facility] due to gargled speech that started at 0800 this morning 03/20/23.  Facility reports it lasted about 15 minutes.  20g left AC.  VS BP 180/112, CBG 204, SpO2 97% RA    Allergies Allergies  Allergen Reactions   Betamethasone Hives   Dexamethasone Hives   Infliximab Rash and Other (See Comments)    Remicade = "She went to rehab because of it"   Latex Other (See Comments)    Causes blisters   Tape Other (See Comments)    Caused blisters    Tylenol [Acetaminophen] Other (See Comments)    Doesn't work for patient    Lisinopril     Other reaction(s): Unknown   Claritin [Loratadine] Other (See Comments)    Doesn't work for patient    Level of Care/Admitting Diagnosis ED Disposition     ED Disposition  Admit   Condition  --   Comment  Hospital Area: [facility] [[id number]]  Level of Care: Telemetry Medical [104]  May place patient in observation at [facility] or [facility] if equivalent level of care is available:: No  Covid Evaluation: Asymptomatic - no recent exposure (last 10 days) testing not required  Diagnosis: TIA (transient ischemic attack) [454098]  Admitting Physician: Hughie Closs [[id number]]  Attending Physician: Hughie Closs [[id number]]          B Medical/Surgery History Past Medical History:  Diagnosis Date   A-fib (HCC)    COPD (chronic obstructive pulmonary disease) (HCC)    Dyslipidemia    Hypertension    Pancreatitis    PE (pulmonary thromboembolism) (HCC)    Rheumatoid arthritis(714.0)    Shingles    Thyroid  disease    Past Surgical History:  Procedure Laterality Date   APPENDECTOMY     FOOT SURGERY     TOTAL HIP ARTHROPLASTY Left      A IV Location/Drains/Wounds Patient Lines/Drains/Airways Status     Active Line/Drains/Airways     Name Placement date Placement time Site Days   Peripheral IV 03/20/23 20 G Left Antecubital 03/20/23  1110  Antecubital  less than 1            Intake/Output Last 24 hours No intake or output data in the 24 hours ending 03/20/23 1621  Labs/Imaging Results for orders placed or performed during the hospital encounter of 03/20/23 (from the past 48 hour(s))  Ethanol     Status: None   Collection Time: 03/20/23 11:34 AM  Result Value Ref Range   Alcohol, Ethyl (B) <10 <10 mg/dL    Comment: (NOTE) Lowest detectable limit for serum alcohol is 10 mg/dL.  For medical purposes only. Performed at [facility], [address] [place], [place] [address]   Protime-INR     Status: None   Collection Time: 03/20/23 11:34 AM  Result Value Ref Range   Prothrombin Time 14.2 11.4 - 15.2 seconds   INR 1.1 0.8 - 1.2    Comment: (NOTE) INR goal varies based on device and disease states. Performed at [facility], [address], [place], [place]  [address]   APTT     Status: None   Collection Time: 03/20/23 11:34 AM  Result Value Ref Range   aPTT 26 24 - 36 seconds    Comment: Performed at [facility], [address], [place], [place] [address]  CBC     Status: None   Collection Time: 03/20/23 11:34 AM  Result Value Ref Range   WBC 9.7 4.0 - 10.5 K/uL   RBC 4.75 3.87 - 5.11 MIL/uL   Hemoglobin 13.6 12.0 - 15.0 g/dL   HCT 29.5 62.1 - 30.8 %   MCV 93.5 80.0 - 100.0 fL   MCH 28.6 26.0 - 34.0 pg   MCHC 30.6 30.0 - 36.0 g/dL   RDW 65.7 84.6 - 96.2 %   Platelets 204 150 - 400 K/uL   nRBC 0.0 0.0 - 0.2 %    Comment: Performed at [facility], [address], [place], [place] [address]  Differential     Status: Abnormal    Collection Time: 03/20/23 11:34 AM  Result Value Ref Range   Neutrophils Relative % 83 %   Neutro Abs 8.1 (H) 1.7 - 7.7 K/uL   Lymphocytes Relative 7 %   Lymphs Abs 0.7 0.7 - 4.0 K/uL   Monocytes Relative 6 %   Monocytes Absolute 0.6 0.1 - 1.0 K/uL   Eosinophils Relative 2 %   Eosinophils Absolute 0.2 0.0 - 0.5 K/uL   Basophils Relative 1 %   Basophils Absolute 0.1 0.0 - 0.1 K/uL   Immature Granulocytes 1 %   Abs Immature Granulocytes 0.08 (H) 0.00 - 0.07 K/uL    Comment: Performed at [facility], [address] [place], [place] [address]  Comprehensive metabolic panel     Status: Abnormal   Collection Time: 03/20/23 11:34 AM  Result Value Ref Range   Sodium 142 135 - 145 mmol/L   Potassium 4.3 3.5 - 5.1 mmol/L   Chloride 108 98 - 111 mmol/L   CO2 23 22 - 32 mmol/L   Glucose, Bld 218 (H) 70 - 99 mg/dL    Comment: Glucose reference range applies only to samples taken after fasting for at least 8 hours.   BUN 19 8 - 23 mg/dL   Creatinine, Ser 0.10 (H) 0.44 - 1.00 mg/dL   Calcium 9.3 8.9 - 27.2 mg/dL   Total Protein 5.5 (L) 6.5 - 8.1 g/dL   Albumin 3.1 (L) 3.5 - 5.0 g/dL   AST 16 15 - 41 U/L   ALT 21 0 - 44 U/L   Alkaline Phosphatase 109 38 - 126 U/L   Total Bilirubin 0.3 0.3 - 1.2 mg/dL   GFR, Estimated 43 (L) >60 mL/min    Comment: (NOTE) Calculated using the CKD-EPI Creatinine Equation (2021)    Anion gap 11 5 - 15    Comment: Performed at [facility], [address] [place], [place] [address]  Urine rapid drug screen (hosp performed)     Status: None   Collection Time: 03/20/23 12:33 PM  Result Value Ref Range   Opiates NONE DETECTED NONE DETECTED   Cocaine NONE DETECTED NONE DETECTED   Benzodiazepines NONE DETECTED NONE DETECTED   Amphetamines NONE DETECTED NONE DETECTED   Tetrahydrocannabinol NONE DETECTED NONE DETECTED   Barbiturates NONE DETECTED NONE DETECTED    Comment: (NOTE) DRUG SCREEN FOR MEDICAL PURPOSES ONLY.  IF CONFIRMATION IS  NEEDED FOR ANY PURPOSE, NOTIFY LAB WITHIN 5 DAYS.  LOWEST DETECTABLE LIMITS  FOR URINE DRUG SCREEN Drug Class                     Cutoff (ng/mL) Amphetamine and metabolites    1000 Barbiturate and metabolites    200 Benzodiazepine                 200 Opiates and metabolites        300 Cocaine and metabolites        300 THC                            50 Performed at [facility], [address] [place], [place] [address]   Urinalysis, Routine w reflex microscopic -Urine, Clean Catch     Status: None   Collection Time: 03/20/23 12:33 PM  Result Value Ref Range   Color, Urine YELLOW YELLOW   APPearance CLEAR CLEAR   Specific Gravity, Urine 1.018 1.005 - 1.030   pH 5.0 5.0 - 8.0   Glucose, UA NEGATIVE NEGATIVE mg/dL   Hgb urine dipstick NEGATIVE NEGATIVE   Bilirubin Urine NEGATIVE NEGATIVE   Ketones, ur NEGATIVE NEGATIVE mg/dL   Protein, ur NEGATIVE NEGATIVE mg/dL   Nitrite NEGATIVE NEGATIVE   Leukocytes,Ua NEGATIVE NEGATIVE    Comment: Performed at [facility], [address] [place], [place] [address]  I-stat chem 8, ED     Status: Abnormal   Collection Time: 03/20/23 12:37 PM  Result Value Ref Range   Sodium 140 135 - 145 mmol/L   Potassium 4.0 3.5 - 5.1 mmol/L   Chloride 108 98 - 111 mmol/L   BUN 20 8 - 23 mg/dL   Creatinine, Ser 1.30 (H) 0.44 - 1.00 mg/dL   Glucose, Bld 865 (H) 70 - 99 mg/dL    Comment: Glucose reference range applies only to samples taken after fasting for at least 8 hours.   Calcium, Ion 1.15 1.15 - 1.40 mmol/L   TCO2 23 22 - 32 mmol/L   Hemoglobin 14.3 12.0 - 15.0 g/dL   HCT 78.4 69.6 - 29.5 %   CT HEAD WO CONTRAST  Result Date: 03/20/2023 CLINICAL DATA:  Stroke, follow up EXAM: CT HEAD WITHOUT CONTRAST TECHNIQUE: Contiguous axial images were obtained from the base of the skull through the vertex without intravenous contrast. RADIATION DOSE REDUCTION: This exam was performed according to the departmental dose-optimization program  which includes automated exposure control, adjustment of the mA and/or kV according to patient size and/or use of iterative reconstruction technique. COMPARISON:  None Available. FINDINGS: Brain: No evidence of acute infarction, hemorrhage, hydrocephalus, extra-axial collection or mass lesion/mass effect. Sequela of moderate chronic microvascular ischemic change with a possible age indeterminate infarcts in the centrum semiovale on the left (series 5, image 44). Vascular: No hyperdense vessel or unexpected calcification. Skull: Normal. Negative for fracture or focal lesion. Sinuses/Orbits: No middle ear or mastoid effusion. Paranasal sinuses are clear. Bilateral lens replacement. Orbits are otherwise. Other: None. IMPRESSION: Sequela of moderate chronic microvascular ischemic change with a possible age indeterminate infarcts in the centrum semiovale on the left. If there is concern for acute infarct, consider further evaluation with brain MRI. Electronically Signed   By: Lorenza Cambridge M.D.   On: 03/20/2023 14:07    Pending Labs Unresulted Labs (From admission, onward)    None       Vitals/Pain Today's Vitals   03/20/23 1127 03/20/23 1134 03/20/23 1415 03/20/23 1515  BP:  (!) 158/94  (!) 188/96  Pulse:  65 63 63  Resp:  20 12 14   Temp:  98 F (36.7 C)  98 F (36.7 C)  TempSrc:  Oral  Oral  SpO2:  98% 95% 95%  Weight: 113.4 kg     Height: 5\' 6"  (1.676 m)     PainSc: 0-No pain 0-No pain      Isolation Precautions No active isolations  Medications Medications - No data to display  Mobility Walks with walker     Focused Assessments    R Recommendations: See Admitting Provider Note  Report given to:   Additional Notes:

## 2023-03-20 NOTE — Progress Notes (Signed)
Attempted echo, but patient is OTF at this time.

## 2023-03-21 ENCOUNTER — Observation Stay (HOSPITAL_COMMUNITY): Payer: Medicare Other

## 2023-03-21 ENCOUNTER — Observation Stay (HOSPITAL_BASED_OUTPATIENT_CLINIC_OR_DEPARTMENT_OTHER): Payer: Medicare Other

## 2023-03-21 DIAGNOSIS — G459 Transient cerebral ischemic attack, unspecified: Secondary | ICD-10-CM | POA: Diagnosis not present

## 2023-03-21 DIAGNOSIS — I6389 Other cerebral infarction: Secondary | ICD-10-CM | POA: Diagnosis not present

## 2023-03-21 DIAGNOSIS — R569 Unspecified convulsions: Secondary | ICD-10-CM

## 2023-03-21 DIAGNOSIS — I10 Essential (primary) hypertension: Secondary | ICD-10-CM

## 2023-03-21 DIAGNOSIS — I639 Cerebral infarction, unspecified: Secondary | ICD-10-CM | POA: Diagnosis not present

## 2023-03-21 DIAGNOSIS — E119 Type 2 diabetes mellitus without complications: Secondary | ICD-10-CM | POA: Diagnosis not present

## 2023-03-21 DIAGNOSIS — E785 Hyperlipidemia, unspecified: Secondary | ICD-10-CM

## 2023-03-21 DIAGNOSIS — L8990 Pressure ulcer of unspecified site, unspecified stage: Secondary | ICD-10-CM | POA: Insufficient documentation

## 2023-03-21 LAB — ECHOCARDIOGRAM COMPLETE
Area-P 1/2: 3.65 cm2
Height: 66 in
S' Lateral: 3.6 cm
Weight: 4000 oz

## 2023-03-21 LAB — LIPID PANEL
Cholesterol: 147 mg/dL (ref 0–200)
HDL: 45 mg/dL (ref >40)
LDL Cholesterol: 55 mg/dL (ref 0–99)
Total CHOL/HDL Ratio: 3.3 RATIO
Triglycerides: 236 mg/dL — ABNORMAL HIGH (ref <150)
VLDL: 47 mg/dL — ABNORMAL HIGH (ref 0–40)

## 2023-03-21 MED ORDER — OXYCODONE HCL 5 MG PO TABS: 5.0000 mg | ORAL_TABLET | ORAL | 0 refills | Status: DC | PRN

## 2023-03-21 MED ORDER — RISAQUAD PO CAPS
2.0000 | ORAL_CAPSULE | Freq: Every day | ORAL | Status: DC
  Administered 2023-03-21 – 2023-03-22 (×2): 2 via ORAL
  Filled 2023-03-21 (×2): qty 2

## 2023-03-21 MED ORDER — IOHEXOL 350 MG/ML SOLN
60.0000 mL | Freq: Once | INTRAVENOUS | Status: AC | PRN
  Administered 2023-03-21: 60 mL via INTRAVENOUS

## 2023-03-21 MED ORDER — ONDANSETRON HCL 4 MG/2ML IJ SOLN
4.0000 mg | Freq: Four times a day (QID) | INTRAMUSCULAR | Status: DC | PRN
  Administered 2023-03-21: 4 mg via INTRAVENOUS
  Filled 2023-03-21: qty 2

## 2023-03-21 MED ORDER — METOPROLOL SUCCINATE ER 50 MG PO TB24: 50.0000 mg | ORAL_TABLET | Freq: Two times a day (BID) | ORAL | 0 refills | Status: DC

## 2023-03-21 NOTE — TOC Transition Note (Signed)
Transition of Care [facility]) - CM/SW Discharge Note   Patient Details  Name: Kristina May MRN: [id number] Date of Birth: 17-Jan-1936  Transition of Care [facility] CM/SW Contact:  Baldemar Lenis, LCSW Phone Number: 03/21/2023, 4:19 PM   Clinical Narrative:   CSW contacted [name] to send patient's paperwork back for discharge. CSW called back and received multiple fax numbers, none of the information came through. CSW received permission to send discharge information with the patient. Transport called with [facility] for next available. CSW updated daughter, Arline Asp, she is in agreement.  Nurse to call report to [phone]    Final next level of care: Assisted Living Barriers to Discharge: Barriers Resolved   Patient Goals and CMS Choice CMS Medicare.gov Compare Post Acute Care list provided to:: Patient Represenative (must comment) Choice offered to / list presented to : Adult Children  Discharge Placement                [name]) Patient to be transferred to facility by: [facility] Name of family member notified: Arline Asp Patient and family notified of of transfer: 03/21/23  Discharge Plan and Services Additional resources added to the After Visit Summary for       Post Acute Care Choice: NA                               Social Determinants of Health (SDOH) Interventions SDOH Screenings   Food Insecurity: No Food Insecurity (03/21/2023)  Housing: Low Risk  (03/21/2023)  Transportation Needs: No Transportation Needs (03/21/2023)  Utilities: Not At Risk (03/21/2023)  Tobacco Use: Medium Risk (03/20/2023)     Readmission Risk Interventions     No data to display

## 2023-03-21 NOTE — Evaluation (Signed)
Speech Language Pathology Evaluation Patient Details Name: Kristina May MRN: [id number] DOB: 05-25-1936 Today's Date: 03/21/2023 Time: 4098-1191 SLP Time Calculation (min) (ACUTE ONLY): 15 min  Problem List:  Patient Active Problem List   Diagnosis Date Noted   Acute ischemic stroke (HCC) 03/21/2023   Pressure injury of skin 03/21/2023   Chronic heart failure with preserved ejection fraction (HCC) 07/19/2021   Chronic pain syndrome 07/19/2021   Right lower lobe pulmonary nodule 05/26/2021   Lab test positive for detection of COVID-19 virus    Pancreatitis 12/09/2020   Palpitations 09/13/2020   Stage 3a chronic kidney disease (HCC) 07/03/2019   MDD (major depressive disorder), recurrent episode, moderate (HCC)    Hypokalemia 05/13/2019   QT prolongation 05/13/2019   UTI (urinary tract infection) 05/13/2019   Suicidal ideation 05/13/2019   Venous stasis 05/13/2019   MDD (major depressive disorder), recurrent severe, without psychosis (HCC)    Atrial fibrillation (HCC) 02/14/2018   Chronic obstructive pulmonary disease (HCC) 02/14/2018   Right knee pain 10/12/2013   Arthritis pain, hand 10/12/2013   Unspecified essential hypertension 10/12/2013   Unspecified hypothyroidism 10/12/2013   Dyslipidemia 10/12/2013   Rheumatoid arthritis (HCC)    Polyarthritis 10/11/2013   Past Medical History:  Past Medical History:  Diagnosis Date   A-fib (HCC)    COPD (chronic obstructive pulmonary disease) (HCC)    Dyslipidemia    Hypertension    Pancreatitis    PE (pulmonary thromboembolism) (HCC)    Rheumatoid arthritis(714.0)    Shingles    Thyroid disease    Past Surgical History:  Past Surgical History:  Procedure Laterality Date   APPENDECTOMY     FOOT SURGERY     TOTAL HIP ARTHROPLASTY Left    HPI:  Pt is an 87 y.o. female who presented 03/20/23 from [facility] nursing home with slurred speech and AMS, which resolved. MRI revealed punctate focus of acute ischemia within the  posterior right parietal lobe. PMH: afib on Eliquis, PE, COPD, HTN, RA, A-fib.   Assessment / Plan / Recommendation Clinical Impression  Patient presents with a mild cognitive-linguistic impairment as per this evaluation. Her son and daughter were in the room as well and they reported that in the morning she was speaking "gibberish" progressing to "gibberish with [place] mixed in" (she is from [place]) and now she is much better. Patient was oriented to time and place, self. She initially was saying she was "87 years old" but after the third time she said that she smirked at her son and he realized she was joking as this is consistent with her personality. Patient named object pictures with 75% accuracy and for objects she could not name she was able to describe. Convergent naming was [facility], responsive naming WFL, divergent naming was impaired with patient naming only 8 animals in 60 seconds. She seemed to use humor to deflect when she was having difficulty. At end of session, she was seeming to try to make a humorous comment, but language was disorganized and SLP unable to determine what she was trying to communicate. Family did report that patient has "a little memory issue" prior to this hospitalization. When SLP describing [facility] services, patient said, "it depends what time they want to come". SLP also discussed how she declined to have [facility] PT and patient indicated that she wants to do what she wants and she is "lazy". SLP encouraged patient to at least try [facility] services.    SLP Assessment  SLP Recommendation/Assessment: All further Speech Lanaguage  Pathology  needs can be addressed in the next venue of care SLP Visit Diagnosis: Cognitive communication deficit (R41.841)    Recommendations for follow up therapy are one component of a multi-disciplinary discharge planning process, led by the attending physician.  Recommendations may be updated based on patient status, additional functional criteria and insurance  authorization.    Follow Up Recommendations  Home health SLP    Assistance Recommended at Discharge  PRN  Functional Status Assessment Patient has had a recent decline in their functional status and demonstrates the ability to make significant improvements in function in a reasonable and predictable amount of time.  Frequency and Duration           SLP Evaluation Cognition  Overall Cognitive Status: Difficult to assess Arousal/Alertness: Awake/alert Orientation Level: Oriented to person;Oriented to place;Oriented to time Year: 2024 Month: June Day of Week: Correct Attention: Sustained Sustained Attention: Impaired Sustained Attention Impairment: Verbal complex;Verbal basic Awareness: Impaired Awareness Impairment: Anticipatory impairment;Emergent impairment Safety/Judgment: Impaired       Comprehension  Auditory Comprehension Overall Auditory Comprehension: Appears within functional limits for tasks assessed    Expression Expression Primary Mode of Expression: Verbal Verbal Expression Overall Verbal Expression: Impaired Initiation: No impairment Repetition: No impairment Naming: Impairment Responsive: 76-100% accurate Confrontation: Impaired Convergent: 75-100% accurate Divergent: 50-74% accurate Pragmatics: Impairment Impairments: Abnormal affect Interfering Components: Attention Effective Techniques: Semantic cues Non-Verbal Means of Communication: Not applicable Written Expression Dominant Hand: Right Written Expression: Not tested   Oral / Motor  Oral Motor/Sensory Function Overall Oral Motor/Sensory Function: Within functional limits Motor Speech Overall Motor Speech: Appears within functional limits for tasks assessed Respiration: Within functional limits Resonance: Within functional limits Articulation: Within functional limitis Intelligibility: Intelligible Motor Planning: Witnin functional limits            Angela Nevin, MA, CCC-SLP Speech  Therapy

## 2023-03-21 NOTE — Progress Notes (Signed)
EEG complete - results pending, no need to LTM as per Melynda Ripple

## 2023-03-21 NOTE — Progress Notes (Signed)
OT Cancellation Note  Patient Details Name: Kristina May MRN: [id number] DOB: 01-Sep-1936   Cancelled Treatment:    Reason Eval/Treat Not Completed: Patient at procedure or test/ unavailable--pt currently having ECHO done, will check back.  Lindon Romp OT Acute Rehabilitation Services Office [phone]    Evette Georges 03/21/2023, 10:22 AM

## 2023-03-21 NOTE — Evaluation (Signed)
Physical Therapy Evaluation Patient Details Name: Kristina May MRN: [id number] DOB: May 11, 1936 Today's Date: 03/21/2023  History of Present Illness  Pt is an 87 y.o. female who presented 03/20/23 from [facility] with slurred speech and AMS, which resolved. MRI revealed punctate focus of acute ischemia within the posterior right parietal lobe. PMH: afib on Eliquis, PE, COPD, HTN, RA, A-fib   Clinical Impression  Pt presents with condition above and deficits mentioned below, see PT Problem List. PTA, she was residing at an ALF where she was mod I using a rollator for bedroom distance mobility. She relied on the nursing staff to transport her in a w/c when going further distances outside the room, but she did just get an electric w/c she is desiring to learn to use soon. Currently, pt displays fairly symmetrical bil lower extremity weakness along with deficits in balance and activity tolerance. She seems to be close to if not at her functional baseline, ambulating with a RW to the door and back in the room at a min guard assist level today. Pt could benefit from some HHPT at the ALF, but she is declining. Will continue to follow acutely to maximize mobility while here.     Recommendations for follow up therapy are one component of a multi-disciplinary discharge planning process, led by the attending physician.  Recommendations may be updated based on patient status, additional functional criteria and insurance authorization.  Follow Up Recommendations       Assistance Recommended at Discharge Intermittent Supervision/Assistance  Patient can return home with the following  A little help with bathing/dressing/bathroom;Assistance with cooking/housework;Direct supervision/assist for medications management;Direct supervision/assist for financial management;Assist for transportation    Equipment Recommendations None recommended by PT  Recommendations for Other Services        Functional Status Assessment Patient has had a recent decline in their functional status and demonstrates the ability to make significant improvements in function in a reasonable and predictable amount of time.     Precautions / Restrictions Precautions Precautions: Fall Restrictions Weight Bearing Restrictions: No      Mobility  Bed Mobility Overal bed mobility: Needs Assistance Bed Mobility: Supine to Sit, Sit to Supine     Supine to sit: Supervision, HOB elevated Sit to supine: Supervision, HOB elevated   General bed mobility comments: Extra time and HOB elevated, supervision for safety    Transfers Overall transfer level: Needs assistance Equipment used: Rolling walker (2 wheels) Transfers: Sit to/from Stand Sit to Stand: Min guard           General transfer comment: Min guard for safety, no LOB    Ambulation/Gait Ambulation/Gait assistance: Min guard Gait Distance (Feet): 20 Feet Assistive device: Rolling walker (2 wheels) Gait Pattern/deviations: Step-through pattern, Decreased stride length Gait velocity: reduced Gait velocity interpretation: <1.31 ft/sec, indicative of household ambulator   General Gait Details: Pt lifts the RW when turning at times, but typically uses a rollator at baseline. No LOB, min guard for safety  Stairs            Wheelchair Mobility    Modified Rankin (Stroke Patients Only) Modified Rankin (Stroke Patients Only) Pre-Morbid Rankin Score: Moderate disability Modified Rankin: Moderate disability     Balance Overall balance assessment: Needs assistance Sitting-balance support: No upper extremity supported, Feet supported Sitting balance-Leahy Scale: Good     Standing balance support: Bilateral upper extremity supported, During functional activity, Reliant on assistive device for balance Standing balance-Leahy Scale: Poor Standing balance comment: Reliant on  RW                             Pertinent  Vitals/Pain Pain Assessment Pain Assessment: Faces Faces Pain Scale: No hurt Pain Intervention(s): Monitored during session    Home Living Family/patient expects to be discharged to:: Assisted living                 Home Equipment: Rollator (4 wheels);Rolling Walker (2 wheels);Wheelchair - power      Prior Function Prior Level of Function : Needs assist             Mobility Comments: Able to ambulate short bedroom distances with a rollator mod I. Just got an electric w/c and wants to learn to use it for longer mobility distances ADLs Comments: takes herself to the bathroom; needs assistance with bathing     Hand Dominance        Extremity/Trunk Assessment   Upper Extremity Assessment Upper Extremity Assessment: Defer to OT evaluation    Lower Extremity Assessment Lower Extremity Assessment: Generalized weakness (seemed fairly symmetrical in strength bil, with gross MMT scores of 4; denied numbness/tingling bil)       Communication   Communication: No difficulties  Cognition Arousal/Alertness: Awake/alert Behavior During Therapy: WFL for tasks assessed/performed Overall Cognitive Status: No family/caregiver present to determine baseline cognitive functioning                                 General Comments: Pt tangential and repetitive with her speech at times, unsure if this is baseline or not.        General Comments General comments (skin integrity, edema, etc.): educated pt that PT could assist with w/c training and strengthening/preserving her strength but pt declining HHPT needs    Exercises     Assessment/Plan    PT Assessment Patient needs continued PT services  PT Problem List Decreased strength;Decreased balance;Decreased activity tolerance;Decreased mobility;Decreased cognition       PT Treatment Interventions DME instruction;Gait training;Functional mobility training;Therapeutic activities;Therapeutic exercise;Neuromuscular  re-education;Balance training;Patient/family education;Cognitive remediation    PT Goals (Current goals can be found in the Care Plan section)  Acute Rehab PT Goals Patient Stated Goal: to go back to her ALF PT Goal Formulation: With patient Time For Goal Achievement: 04/04/23 Potential to Achieve Goals: Good    Frequency Min 3X/week     Co-evaluation               AM-PAC PT "6 Clicks" Mobility  Outcome Measure Help needed turning from your back to your side while in a flat bed without using bedrails?: None Help needed moving from lying on your back to sitting on the side of a flat bed without using bedrails?: A Little Help needed moving to and from a bed to a chair (including a wheelchair)?: A Little Help needed standing up from a chair using your arms (e.g., wheelchair or bedside chair)?: A Little Help needed to walk in hospital room?: A Little Help needed climbing 3-5 steps with a railing? : A Lot 6 Click Score: 18    End of Session Equipment Utilized During Treatment: Gait belt Activity Tolerance: Patient tolerated treatment well Patient left: in bed;with call bell/phone within reach;Other (comment) (with OT) Nurse Communication: Mobility status PT Visit Diagnosis: Unsteadiness on feet (R26.81);Other abnormalities of gait and mobility (R26.89);Muscle weakness (generalized) (M62.81)    Time:  4540-9811 PT Time Calculation (min) (ACUTE ONLY): 17 min   Charges:   PT Evaluation $PT Eval Moderate Complexity: 1 Mod          Raymond Gurney, PT, DPT Acute Rehabilitation Services  Office: [phone]   Jewel Baize 03/21/2023, 2:20 PM

## 2023-03-21 NOTE — Consult Note (Signed)
NEUROLOGY CONSULTATION NOTE   Date of service: March 21, 2023 Patient Name: Kristina May MRN:  [id number] DOB:  09-Aug-1936 Reason for consult: "20 mins episode of word finding difficulty concerning for TIA" Requesting Provider: Hughie Closs, MD _ _ _   _ __   _ __ _ _  __ __   _ __   __ _  History of Present Illness  Kristina May is a 87 y.o. female with PMH significant for afibb, PE on eliquis, COPD who presents with  episode of slurred speech and confusion. The episode lasted 20 mins and spontaneously resolved. Episode occurred a couple day ago althou she cannot recall the exact day. She was very aware that she was having word finding difficulty. She came in to ED for concern for TIA. MRI brain shows a punctate cortical R parietal lobe stroke.  She denies missing any doses of eliquis recently.  LKW: 3 days ago mRS: 2 tNKASE: not offered, outside window and resolution of symptoms. Thrombectomy: not offered, resolution of symptoms. NIHSS components Score: Comment  1a Level of Conscious 0[x]  1[]  2[]  3[]      1b LOC Questions 0[x]  1[]  2[]       1c LOC Commands 0[x]  1[]  2[]       2 Best Gaze 0[x]  1[]  2[]       3 Visual 0[x]  1[]  2[]  3[]      4 Facial Palsy 0[x]  1[]  2[]  3[]      5a Motor Arm - left 0[x]  1[]  2[]  3[]  4[]  UN[]    5b Motor Arm - Right 0[x]  1[]  2[]  3[]  4[]  UN[]    6a Motor Leg - Left 0[x]  1[]  2[]  3[]  4[]  UN[]    6b Motor Leg - Right 0[x]  1[]  2[]  3[]  4[]  UN[]    7 Limb Ataxia 0[x]  1[]  2[]  3[]  UN[]     8 Sensory 0[x]  1[]  2[]  UN[]      9 Best Language 0[x]  1[]  2[]  3[]      10 Dysarthria 0[x]  1[]  2[]  UN[]      11 Extinct. and Inattention 0[x]  1[]  2[]       TOTAL: 0       ROS   Constitutional Denies weight loss, fever and chills.   HEENT Denies changes in vision and hearing.   Respiratory Denies SOB and cough.   CV Denies palpitations and CP   GI Denies abdominal pain, nausea, vomiting and diarrhea.   GU Denies dysuria and urinary frequency.   MSK Denies myalgia and joint pain.    Skin Denies rash and pruritus.   Neurological Denies headache and syncope.   Psychiatric Denies recent changes in mood. Denies anxiety and depression.    Past History   Past Medical History:  Diagnosis Date   A-fib (HCC)    COPD (chronic obstructive pulmonary disease) (HCC)    Dyslipidemia    Hypertension    Pancreatitis    PE (pulmonary thromboembolism) (HCC)    Rheumatoid arthritis(714.0)    Shingles    Thyroid disease    Past Surgical History:  Procedure Laterality Date   APPENDECTOMY     FOOT SURGERY     TOTAL HIP ARTHROPLASTY Left    Family History  Problem Relation Age of Onset   Multiple sclerosis Other    Social History   Socioeconomic History   Marital status: Widowed    Spouse name: Not on file   Number of children: Not on file   Years of education: Not on file   Highest education level: Not on file  Occupational History  Not on file  Tobacco Use   Smoking status: Former   Smokeless tobacco: Never  Substance and Sexual Activity   Alcohol use: Yes    Comment: Occ   Drug use: No   Sexual activity: Not on file  Other Topics Concern   Not on file  Social History Narrative   Not on file   Social Determinants of Health   Financial Resource Strain: Not on file  Food Insecurity: Not on file  Transportation Needs: Not on file  Physical Activity: Not on file  Stress: Not on file  Social Connections: Not on file   Allergies  Allergen Reactions   Celestone [Betamethasone] Hives   Decadron [Dexamethasone] Hives   Latex Rash    Blistering rash   Remicade [Infliximab] Rash and Other (See Comments)    Remicade = "She went to rehab because of it"   Tape Rash    Blistering rash   Tylenol [Acetaminophen] Other (See Comments)    Not effective for pt   Z-Pak [Azithromycin] Other (See Comments)    Unknown reaction   Zestril [Lisinopril] Other (See Comments)    Unknown reaction   Claritin [Loratadine] Other (See Comments)    Not effective for pt    Wound Dressing Adhesive Rash    Blistering rash    Medications   Medications Prior to Admission  Medication Sig Dispense Refill Last Dose   albuterol (VENTOLIN HFA) 108 (90 Base) MCG/ACT inhaler Inhale 2 puffs into the lungs every 6 (six) hours as needed for wheezing or shortness of breath.   UNK   apixaban (ELIQUIS) 5 MG TABS tablet Take 5 mg by mouth 2 (two) times daily.   03/20/2023 at 0900   aspirin EC 81 MG tablet Take 81 mg by mouth daily.   03/20/2023   Calcium Carb-Cholecalciferol (CALCIUM 600/VITAMIN D3) 600-20 MG-MCG TABS Take 1 tablet by mouth at bedtime.   03/19/2023   calcium carbonate (OS-CAL - DOSED IN MG OF ELEMENTAL CALCIUM) 1250 (500 Ca) MG tablet Take 500 mg of elemental calcium by mouth at bedtime.   03/19/2023   calcium carbonate (TUMS EX) 750 MG chewable tablet Chew 750 mg by mouth every 12 (twelve) hours as needed for heartburn.   UNK   CARBOXYMETHYLCELLULOSE SODIUM OP Place 1 drop into both eyes every 8 (eight) hours as needed (dry eyes).   UNK   Cholecalciferol (VITAMIN D3) 50 MCG (2000 UT) TABS Take 2,000 Units by mouth daily.   03/20/2023   DULoxetine (CYMBALTA) 30 MG capsule Take 30 mg by mouth at bedtime.   03/19/2023   fexofenadine (ALLEGRA) 180 MG tablet Take 180 mg by mouth at bedtime.   03/19/2023   Fluticasone-Salmeterol (ADVAIR) 250-50 MCG/DOSE AEPB Inhale 1 puff into the lungs 2 (two) times daily.   03/20/2023   folic acid (FOLVITE) 1 MG tablet Take 1 mg by mouth daily.   03/20/2023   furosemide (LASIX) 20 MG tablet Take 20 mg by mouth daily.   03/20/2023   Guaifenesin (MUCINEX MAXIMUM STRENGTH) 1200 MG TB12 Take 1,200 mg by mouth every 12 (twelve) hours as needed (cough, congestion).   Past Month   guaiFENesin (MUCINEX) 600 MG 12 hr tablet Take 600 mg by mouth every 12 (twelve) hours.   03/20/2023   INCRUSE ELLIPTA 62.5 MCG/INH AEPB Inhale 1 puff into the lungs daily.   03/20/2023   ipratropium-albuterol (DUONEB) 0.5-2.5 (3) MG/3ML SOLN Take 3 mLs by nebulization  every 6 (six) hours as needed (wheezing).  UNK   LACTOBACILLUS PO Take 1 tablet by mouth daily.   03/20/2023   leflunomide (ARAVA) 10 MG tablet Take 10 mg by mouth daily.   03/20/2023   levothyroxine (SYNTHROID) 150 MCG tablet Take 150 mcg by mouth daily.   03/20/2023   loperamide (IMODIUM A-D) 2 MG tablet Take 2 mg by mouth every 8 (eight) hours as needed for diarrhea or loose stools.   UNK   losartan (COZAAR) 25 MG tablet Take 25 mg by mouth daily.   03/20/2023   metoprolol tartrate (LOPRESSOR) 50 MG tablet Take 50 mg by mouth 2 (two) times daily.   03/20/2023 at 0900   naproxen sodium (ALEVE) 220 MG tablet Take 220 mg by mouth See admin instructions. 2 entries on MAR : 1) 220 mg once daily every morning 0900 2) 220 mg every 8 hours as needed for pain (must be 8 hours after scheduled dose)   03/20/2023   ondansetron (ZOFRAN) 4 MG tablet Take 4 mg by mouth every 8 (eight) hours as needed for nausea or vomiting.      potassium chloride SA (K-DUR) 20 MEQ tablet Take 2 tablets (40 mEq total) by mouth 2 (two) times daily. (Patient taking differently: Take 20 mEq by mouth daily.) 60 tablet 0 03/20/2023   pravastatin (PRAVACHOL) 40 MG tablet Take 40 mg by mouth at bedtime.   03/19/2023   predniSONE (DELTASONE) 5 MG tablet Take 5 mg by mouth 2 (two) times daily.   03/20/2023     Vitals   Vitals:   03/20/23 1658 03/20/23 2100 03/20/23 2339 03/21/23 0401  BP: (!) 143/125 (!) 194/100 (!) 160/96 (!) 164/99  Pulse: 67 69 74 65  Resp: 16 18    Temp: 98 F (36.7 C) 97.8 F (36.6 C) (!) 97.5 F (36.4 C) 97.7 F (36.5 C)  TempSrc: Oral Oral Oral Oral  SpO2: 94% 97% 99% 98%  Weight:      Height:         Body mass index is 40.35 kg/m.  Physical Exam   General: Laying comfortably in bed; in no acute distress.  HENT: Normal oropharynx and mucosa. Normal external appearance of ears and nose.  Neck: Supple, no pain or tenderness  CV: No JVD. No peripheral edema.  Pulmonary: Symmetric Chest rise.  Normal respiratory effort.  Abdomen: Soft to touch, non-tender.  Ext: No cyanosis, edema, or deformity  Skin: No rash. Normal palpation of skin.   Musculoskeletal: Normal digits and nails by inspection. No clubbing.   Neurologic Examination  Mental status/Cognition: Alert, oriented to self, place, month and year, good attention.  Speech/language: Fluent, comprehension intact, object naming intact, repetition intact.  Cranial nerves:   CN II Pupils equal and reactive to light, no VF deficits    CN III,IV,VI EOM intact, no gaze preference or deviation, no nystagmus    CN V normal sensation in V1, V2, and V3 segments bilaterally    CN VII no asymmetry, no nasolabial fold flattening    CN VIII normal hearing to speech    CN IX & X normal palatal elevation, no uvular deviation    CN XI 5/5 head turn and 5/5 shoulder shrug bilaterally    CN XII midline tongue protrusion    Motor:  Muscle bulk: normal, tone normal, pronator drift none tremor none Mvmt Root Nerve  Muscle Right Left Comments  SA C5/6 Ax Deltoid 5 5   EF C5/6 Mc Biceps 5 5   EE C6/7/8 Rad Triceps 5  5   WF C6/7 Med FCR     WE C7/8 PIN ECU     F Ab C8/T1 U ADM/FDI 5 5   HF L1/2/3 Fem Illopsoas 5 5   KE L2/3/4 Fem Quad 5 5   DF L4/5 D Peron Tib Ant 5 5   PF S1/2 Tibial Grc/Sol 5 5    Sensation:  Light touch Intact throughout   Pin prick    Temperature    Vibration   Proprioception    Coordination/Complex Motor:  - Finger to Nose intact BL - Heel to shin unable to do 2/2 body habitus. - Rapid alternating movement are slowed BL - Gait: deferred.  Labs   CBC:  Recent Labs  Lab 03/20/23 1134 03/20/23 1237  WBC 9.7  --   NEUTROABS 8.1*  --   HGB 13.6 14.3  HCT 44.4 42.0  MCV 93.5  --   PLT 204  --     Basic Metabolic Panel:  Lab Results  Component Value Date   NA 140 03/20/2023   K 4.0 03/20/2023   CO2 23 03/20/2023   GLUCOSE 221 (H) 03/20/2023   BUN 20 03/20/2023   CREATININE 1.20 (H) 03/20/2023    CALCIUM 9.3 03/20/2023   GFRNONAA 43 (L) 03/20/2023   GFRAA 56 (L) 05/21/2019   Lipid Panel:  Lab Results  Component Value Date   LDLCALC 55 03/21/2023   HgbA1c:  Lab Results  Component Value Date   HGBA1C 6.7 (H) 03/20/2023   Urine Drug Screen:     Component Value Date/Time   LABOPIA NONE DETECTED 03/20/2023 1233   COCAINSCRNUR NONE DETECTED 03/20/2023 1233   LABBENZ NONE DETECTED 03/20/2023 1233   AMPHETMU NONE DETECTED 03/20/2023 1233   THCU NONE DETECTED 03/20/2023 1233   LABBARB NONE DETECTED 03/20/2023 1233    Alcohol Level     Component Value Date/Time   ETH <10 03/20/2023 1134    CT Head without contrast(Personally reviewed): CTH was negative for a large hypodensity concerning for a large territory infarct or hyperdensity concerning for an ICH  CT angio Head and Neck with contrast: pending  MRI Brain(Personally reviewed): Small punctate cortical R hemispheric stroke  Impression   Kristina May is a 87 y.o. female with PMH significant for afibb on eliquis, COPD who presents with  episode of slurred speech and confusion. The episode lasted 20 mins and spontaneously resolved. MRI shows a small punctate cortical R parietal lobe stroke. She is compliant with eliquis and has not missed any doses.  Suspect potential eliquis failure. Will get CTA to evaluate for significant athero or LVO but if negative, consider swithing to a different DOAC.  Recommendations  - Frequent Neuro checks per stroke unit protocol - Recommend Vascular imaging with CTA head and neck - no need to obtain TTE given unlikely to change the need for [facility]. - Recommend obtaining Lipid panel with LDL - Please start statin if LDL > 70 - Recommend HbA1c to evaluate for diabetes and how well it is controlled. - if CTA non revealing, consider switching to a different DOAC. - SBP goal - gradual normotension. - Recommend Telemetry monitoring for arrythmia - Recommend bedside swallow screen prior to  PO intake. - Stroke education booklet - Recommend PT/OT/SLP consult - stroke team to follow. ______________________________________________________________________   Thank you for the opportunity to take part in the care of this patient. If you have any further questions, please contact the neurology consultation attending.  Signed,  Erick Blinks [facility] _ _ _   _ __   _ __ _ _  __ __   _ __   __ _

## 2023-03-21 NOTE — Progress Notes (Addendum)
STROKE TEAM PROGRESS NOTE   INTERVAL HISTORY Her son is at the bedside.    Patient reports that she was aware of her surroundings when she started experiencing slurred speech and word finding difficulties.  Patient states she does not know how she gets her eliquis, but apparently lives in a nursing facility.    This PM, patient noted to have more slurred speech and confusion. She also had a headache.   On my exam, she was back to baseline with respect to her speech but did note that she had a diffuse headache. She does not get headaches usually at home. She had no new neurological deficits.  Patient states she is ambidextrous.  MRI shows tiny punctate right frontal cortical infarct.  CT angiogram shows no large vessel stenosis or occlusion. Vitals:   03/21/23 0401 03/21/23 0808 03/21/23 1206 03/21/23 1300  BP: (!) 164/99 (!) 189/136 (!) 153/87 (!) 184/88  Pulse: 65 74 70   Resp:  20 18   Temp: 97.7 F (36.5 C) 98.2 F (36.8 C) 98.7 F (37.1 C)   TempSrc: Oral Oral Oral   SpO2: 98% 97% 98% 98%  Weight:      Height:       CBC:  Recent Labs  Lab 03/20/23 1134 03/20/23 1237  WBC 9.7  --   NEUTROABS 8.1*  --   HGB 13.6 14.3  HCT 44.4 42.0  MCV 93.5  --   PLT 204  --    Basic Metabolic Panel:  Recent Labs  Lab 03/20/23 1134 03/20/23 1237  NA 142 140  K 4.3 4.0  CL 108 108  CO2 23  --   GLUCOSE 218* 221*  BUN 19 20  CREATININE 1.24* 1.20*  CALCIUM 9.3  --    Lipid Panel:  Recent Labs  Lab 03/21/23 0358  CHOL 147  TRIG 236*  HDL 45  CHOLHDL 3.3  VLDL 47*  LDLCALC 55   HgbA1c:  Recent Labs  Lab 03/20/23 1856  HGBA1C 6.7*   Urine Drug Screen:  Recent Labs  Lab 03/20/23 1233  LABOPIA NONE DETECTED  COCAINSCRNUR NONE DETECTED  LABBENZ NONE DETECTED  AMPHETMU NONE DETECTED  THCU NONE DETECTED  LABBARB NONE DETECTED    Alcohol Level  Recent Labs  Lab 03/20/23 1134  ETH <10    IMAGING past 24 hours ECHOCARDIOGRAM COMPLETE  Result Date:  03/21/2023    ECHOCARDIOGRAM REPORT   Patient Name:   LURAY DOLD Date of Exam: 03/21/2023 Medical Rec #:  [id number]       Height:       66.0 in Accession #:    [id number]      Weight:       250.0 lb Date of Birth:  [birth year]       BSA:          2.199 m Patient Age:    87 years        BP:           189/136 mmHg Patient Gender: F               HR:           67 bpm. Exam Location:  Inpatient Procedure: 2D Echo, Cardiac Doppler and Color Doppler Indications:    stroke  History:        Patient has no prior history of Echocardiogram examinations.                 COPD and chronic  kindey disease, Arrythmias:Atrial Fibrillation;                 Risk Factors:Hypertension and Dyslipidemia.  Sonographer:    Delcie Roch RDCS Referring Phys: [id number] RAVI PAHWANI  Sonographer Comments: Patient is obese. Image acquisition challenging due to patient body habitus. IMPRESSIONS  1. Left ventricular ejection fraction, by estimation, is 60 to 65%. The left ventricle has normal function. The left ventricle has no regional wall motion abnormalities. There is mild left ventricular hypertrophy. Left ventricular diastolic parameters are consistent with Grade I diastolic dysfunction (impaired relaxation).  2. Right ventricular systolic function is normal. The right ventricular size is normal.  3. Left atrial size was mildly dilated.  4. A small pericardial effusion is present. There is no evidence of cardiac tamponade.  5. No evidence of mitral valve regurgitation. Moderate mitral annular calcification.  6. The aortic valve was not well visualized. Aortic valve regurgitation is not visualized.  7. The inferior vena cava is normal in size with greater than 50% respiratory variability, suggesting right atrial pressure of 3 mmHg. Conclusion(s)/Recommendation(s): If there is a high suspicion for a cardioembolic source, consider limited TTE with a bubble study. FINDINGS  Left Ventricle: Left ventricular ejection fraction, by estimation,  is 60 to 65%. The left ventricle has normal function. The left ventricle has no regional wall motion abnormalities. The left ventricular internal cavity size was normal in size. There is  mild left ventricular hypertrophy. Left ventricular diastolic parameters are consistent with Grade I diastolic dysfunction (impaired relaxation). Right Ventricle: The right ventricular size is normal. Right ventricular systolic function is normal. Left Atrium: Left atrial size was mildly dilated. Right Atrium: Right atrial size was normal in size. Pericardium: A small pericardial effusion is present. There is no evidence of cardiac tamponade. Mitral Valve: Moderate mitral annular calcification. No evidence of mitral valve regurgitation. Tricuspid Valve: Tricuspid valve regurgitation is not demonstrated. Aortic Valve: The aortic valve was not well visualized. Aortic valve regurgitation is not visualized. Pulmonic Valve: Pulmonic valve regurgitation is not visualized. Aorta: The aortic root and ascending aorta are structurally normal, with no evidence of dilitation. Venous: The inferior vena cava is normal in size with greater than 50% respiratory variability, suggesting right atrial pressure of 3 mmHg. IAS/Shunts: The interatrial septum was not well visualized.  LEFT VENTRICLE PLAX 2D LVIDd:         5.30 cm   Diastology LVIDs:         3.60 cm   LV e' medial:    4.57 cm/s LV PW:         1.30 cm   LV E/e' medial:  20.0 LV IVS:        1.30 cm   LV e' lateral:   5.55 cm/s LVOT diam:     2.10 cm   LV E/e' lateral: 16.5 LV SV:         57 LV SV Index:   26 LVOT Area:     3.46 cm  RIGHT VENTRICLE             IVC RV S prime:     11.40 cm/s  IVC diam: 1.10 cm TAPSE (M-mode): 1.4 cm LEFT ATRIUM             Index LA Vol (A2C):   72.4 ml 32.93 ml/m LA Vol (A4C):   72.2 ml 32.84 ml/m LA Biplane Vol: 73.9 ml 33.61 ml/m  AORTIC VALVE LVOT Vmax:   82.50 cm/s LVOT Vmean:  52.800 cm/s LVOT VTI:    0.165 m  AORTA Ao Root diam: 3.40 cm Ao Asc diam:   3.30 cm MITRAL VALVE MV Area (PHT): 3.65 cm     SHUNTS MV Decel Time: 208 msec     Systemic VTI:  0.16 m MV E velocity: 91.30 cm/s   Systemic Diam: 2.10 cm MV A velocity: 124.00 cm/s MV E/A ratio:  0.74 Photographer signed by Carolan Clines Signature Date/Time: 03/21/2023/11:33:48 AM    Final    CT ANGIO HEAD NECK W WO CM  Result Date: 03/21/2023 CLINICAL DATA:  TIA, determine embolic source EXAM: CT ANGIOGRAPHY HEAD AND NECK WITH AND WITHOUT CONTRAST TECHNIQUE: Multidetector CT imaging of the head and neck was performed using the standard protocol during bolus administration of intravenous contrast. Multiplanar CT image reconstructions and MIPs were obtained to evaluate the vascular anatomy. Carotid stenosis measurements (when applicable) are obtained utilizing NASCET criteria, using the distal internal carotid diameter as the denominator. RADIATION DOSE REDUCTION: This exam was performed according to the departmental dose-optimization program which includes automated exposure control, adjustment of the mA and/or kV according to patient size and/or use of iterative reconstruction technique. CONTRAST:  60mL OMNIPAQUE IOHEXOL 350 MG/ML SOLN COMPARISON:  Brain MRI from yesterday FINDINGS: CT HEAD FINDINGS Brain: Patient's acute parietal infarct on the right is indistinguishable by CT. Chronic small vessel ischemia and cerebral volume loss. No hemorrhage, hydrocephalus, or masslike finding Vascular: See below Skull: Unremarkable Sinuses/Orbits: Unremarkable Review of the MIP images confirms the above findings CTA NECK FINDINGS Aortic arch: Atheromatous plaque with 3 vessel branching Right carotid system: ICA beading without acute dissection or flow limiting stenosis. Mild atherosclerosis at the bifurcation. Left carotid system: ICA beading and tortuosity. Limited atheromatous plaque for age. No stenosis or dissection. Vertebral arteries: No proximal subclavian stenosis. The right vertebral artery is  dominant. Vertebral tortuosity without stenosis or dissection. Skeleton: Ordinary degenerative changes.  No acute finding Other neck: No acute finding Upper chest: Clear apical lungs Review of the MIP images confirms the above findings CTA HEAD FINDINGS Anterior circulation: No significant stenosis, proximal occlusion, aneurysm, or vascular malformation. Atheromatous calcification along the carotid siphons Posterior circulation: Dominant right vertebral artery. The vertebral and basilar arteries are smoothly contoured and widely patent. No branch occlusion, beading, or aneurysm. Venous sinuses: Unremarkable for arterial timing. Anatomic variants: None significant Review of the MIP images confirms the above findings IMPRESSION: 1. No emergent vascular finding. 2. Subjectively mild atherosclerosis for age. No flow limiting stenosis of major arteries in the head and neck. 3. Fibromuscular dysplasia of the cervical carotids. Electronically Signed   By: Tiburcio Pea M.D.   On: 03/21/2023 08:10   MR BRAIN WO CONTRAST  Result Date: 03/20/2023 CLINICAL DATA:  Transient ischemic attack EXAM: MRI HEAD WITHOUT CONTRAST TECHNIQUE: Multiplanar, multiecho pulse sequences of the brain and surrounding structures were obtained without intravenous contrast. COMPARISON:  None Available. FINDINGS: Brain: Punctate focus of abnormal diffusion restriction within the posterior right parietal lobe. No acute or chronic hemorrhage. There is multifocal hyperintense T2-weighted signal within the white matter. Generalized volume loss. The midline structures are normal. Vascular: Major flow voids are preserved. Skull and upper cervical spine: Normal calvarium and skull base. Visualized upper cervical spine and soft tissues are normal. Sinuses/Orbits:No paranasal sinus fluid levels or advanced mucosal thickening. No mastoid or middle ear effusion. Normal orbits. IMPRESSION: 1. Punctate focus of acute ischemia within the posterior right  parietal lobe. No hemorrhage or mass effect. 2.  Findings of chronic small vessel ischemia and volume loss. Electronically Signed   By: Deatra Robinson M.D.   On: 03/20/2023 20:44    PHYSICAL EXAM Constitutional: Pleasant elderly Caucasian lady in NAD  Psych: Affect appropriate to situation Eyes: No scleral injection Head: Normocephalic.  Cardiovascular: Normal rate and regular rhythm.  Respiratory: Effort normal and breath sounds normal to anterior ascultation GI: Soft.  No distension. There is no tenderness.  Skin: WDI, 2+ pitting edema of BLE. TT light palpation.   Neuro: Mental Status: Patient is awake, alert, oriented to person, place, time diminished attention registration and recall. Patient is able to give a clear and coherent history. Some hearing difficulties Cranial Nerves: II: Decreased peripheral vision in the L and R side. Pupils are equal, round, and reactive to light.   III,IV, VI: EOMI without ptosis or diplopia.  V: Facial sensation is symmetric to light touch VII: Face is symmetric resting and smiling.  VIII: Hearing intact to voice  X: Palate is midline and palate elevates symmetrically, phonation intact XI: Shoulder shrug is symmetric. XII: tongue is midline without atrophy or fasciculations.  Motor: Tone is normal. Bulk is normal. 5/5 strength was present BUE, 4/5 BLE limited due to pain  Sensory: Sensation is symmetric to light touch  Cerebellar: FNF intact bilaterally   ASSESSMENT/PLAN Kristina May is a 87 y.o. female with history of pAfib on Eliquis, COPD, HTN, HLD, PE, and RA  presenting with slurred speech and confusion noted to have MRI brain finding of punctate cortical R parietal lobe stroke.   R parietal lobe stroke (likely silent) with possible L hemispheric TIA - aphasia in the setting of her suspected mild baseline cognitive impairment  Etiology:  cardioembolic   Unclear if patient's acute stroke is cause of her symptoms given location. It  is also unclear if this is a failure of her anticoagulation as patient was not aware of how she gets her eliquis and if she takes this daily.  In the outpatient setting this can be verified with her PCP, and patient can follow up in neurology clinic to determine if this is an actual [facility] failure and she needs to be transitioned to other DOAC CTA head & neck Mild atherosclerosis. No flow limiting stenosis. FMD of cervical carotids  MRI  Punctate focus of acute ischemia within posterior R parietal lobe. Chronic small vessel ischemia  2D Echo EF 60 to 65%, LV G1DD. No valvulopathy  LDL 55 HgbA1c 6.7 Obtain EEG, given episode of brief confusion and slurred speech, if normal or no concern for seizure like activity can discharge home.  VTE prophylaxis - Home eliquis     Diet   Diet Heart Room service appropriate? Yes; Fluid consistency: Thin  Detailed cognitive assessment in the outpatient setting  aspirin 81 mg daily and Eliquis (apixaban) daily prior to admission, now on aspirin 81 mg daily and Eliquis (apixaban) daily.  Therapy recommendations:  Home Disposition:  Nursing facility   Hypertension Home meds:  losartan 25mg , metoprolol 50mg  BID,  Stable, elevated  Permissive hypertension (OK if < 220/120) but gradually normalize in 5-7 days Long-term BP goal normotensive  Hyperlipidemia Home meds:  pravachol, resumed in hospital LDL 55, goal < 70 At goal  Continue statin at discharge  Diabetes type II Controlled Home meds:  Not on antidiabetic medications  HgbA1c 6.7, goal < 7.0  Hypothyroidism   On synthroid, recommend check of this outpatient   Other Stroke Risk Factors Yes Advanced Age >/= 70  Substance abuse - UDS:  THC NONE DETECTED, Cocaine NONE DETECTED. Patient advised to stop using due to stroke risk. Obesity, Body mass index is 40.35 kg/m., BMI >/= 30 associated with increased stroke risk, recommend weight loss, diet and exercise as appropriate  Hx stroke/TIA Coronary  artery disease Likely Obstructive sleep apnea,  Congestive heart failure   Hospital day # 0  Marolyn Haller, MD PGY-3 Internal Medicine Resident  Pager [phone]  STROKE MD NOTE :  I have personally obtained history,examined this patient, reviewed notes, independently viewed imaging studies, participated in medical decision making and plan of care.ROS completed by me personally and pertinent positives fully documented  I have made any additions or clarifications directly to the above note. Agree with note above.  Patient presented with transient expressive aphasia likely left hemispheric TIA with MRI showing tiny right frontal punctate infarct unclear if this is symptomatic as patient is ambidextrous but not sure languages is localized to the right hemisphere.  Continue Eliquis for stroke prevention as there is no clear data suggesting switching to Pradaxa or Xarelto is necessarily so..  Check EEG for seizure activity since patient had recurrent transient episode later this morning after rounds but quickly returned back to baseline.  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  Discussed with Dr. Beverly Gust.  Greater than 50% time during this 50-minute visit was spent on counseling and coordination of care about her transient aphasia and atrial fibrillation and discussion of risk-benefit of anticoagulation and alternatives to Eliquis and discussion with care team.  Delia Heady, MD Medical Director [facility] Pager: [phone] 03/21/2023 3:30 PM   To contact Stroke Continuity provider, please refer to [email or web address]. After hours, contact General Neurology

## 2023-03-21 NOTE — Discharge Summary (Signed)
Physician Discharge Summary  Kristina May WUJ:[id number] DOB: Jan 25, 1936 DOA: 03/20/2023  PCP: Jettie Pagan, NP  Admit date: 03/20/2023 Discharge date: 03/21/2023    Admitted From: ALF Disposition: ALF  Recommendations for Outpatient Follow-up:  Follow up with PCP in 1-2 weeks Please obtain BMP/CBC in one week Please follow up with your PCP on the following pending results: Unresulted Labs (From admission, onward)     Start     Ordered   Pending  MRSA Next Gen by PCR, Nasal  Once,   R        Pending              Home Health: None-patient declined Equipment/Devices: None  Discharge Condition: Stable CODE STATUS: DNR Diet recommendation: Cardiac  HPI: Kristina May is a 87 y.o. female with medical history significant of COPD, paroxysmal atrial fibrillation on Eliquis, hypertension, hyperlipidemia, history of PE, chronic systolic congestive heart failure who lives at a nursing home was brought in to the emergency department due to episode of slurred speech and confusion.  According to the reports provided by the patient and patient's daughter at the bedside, patient was in her usual state of health when she slept last night but when she woke up this morning, she had above-mentioned symptoms but those symptoms lasted only 20 minutes however due to concern of stroke, she was presented to the emergency department.  Obviously, upon presentation, she was back at her baseline.  Patient endorses no other complaint.  During my evaluation, patient offers no symptoms.   ED Course: Upon presentation to ED, she was slightly hypertensive, labs were within normal range, CT head was done which was concerning for possible stroke so MRI was ordered and hospital service were consulted for admission.  Subjective: Patient seen and examined.  No complaints.  At her baseline.  Brief/Interim Summary: Patient was admitted for stroke workup.  MRI showed punctate focus of acute ischemia within  posterior right parietal lobe, no mass or hemorrhage.  Seen by neurology.  They recommended resuming PTA home medications including Eliquis.  No changes in medications.  She was seen by PT OT and they recommended home health but patient declined that as well.  Her blood pressure is slightly elevated at the time of discharge since we are holding her antihypertensives to allow permissive hypertension for first 48 hours which will end tomorrow morning, patient can resume her antihypertensive medications tomorrow.  Patient is totally asymptomatic.  I also discussed discharge plan with the daughter over the phone.  Discharge Diagnoses:  Active Problems:   Dyslipidemia   Rheumatoid arthritis (HCC)   Atrial fibrillation (HCC)   Stage 3a chronic kidney disease (HCC)   Acute ischemic stroke (HCC)   Pressure injury of skin    Discharge Instructions   Allergies as of 03/21/2023       Reactions   Celestone [betamethasone] Hives   Decadron [dexamethasone] Hives   Latex Rash   Blistering rash   Remicade [infliximab] Rash, Other (See Comments)   Remicade = "She went to rehab because of it"   Tape Rash   Blistering rash   Tylenol [acetaminophen] Other (See Comments)   Not effective for pt   Z-pak [azithromycin] Other (See Comments)   Unknown reaction   Zestril [lisinopril] Other (See Comments)   Unknown reaction   Claritin [loratadine] Other (See Comments)   Not effective for pt   Wound Dressing Adhesive Rash   Blistering rash        Medication  List     TAKE these medications    albuterol 108 (90 Base) MCG/ACT inhaler Commonly known as: VENTOLIN HFA Inhale 2 puffs into the lungs every 6 (six) hours as needed for wheezing or shortness of breath.   apixaban 5 MG Tabs tablet Commonly known as: ELIQUIS Take 5 mg by mouth 2 (two) times daily.   aspirin EC 81 MG tablet Take 81 mg by mouth daily.   Calcium 600/Vitamin D3 600-20 MG-MCG Tabs Generic drug: Calcium  Carb-Cholecalciferol Take 1 tablet by mouth at bedtime.   calcium carbonate 1250 (500 Ca) MG tablet Commonly known as: OS-CAL - dosed in mg of elemental calcium Take 500 mg of elemental calcium by mouth at bedtime.   calcium carbonate 750 MG chewable tablet Commonly known as: TUMS EX Chew 750 mg by mouth every 12 (twelve) hours as needed for heartburn.   CARBOXYMETHYLCELLULOSE SODIUM OP Place 1 drop into both eyes every 8 (eight) hours as needed (dry eyes).   DULoxetine 30 MG capsule Commonly known as: CYMBALTA Take 30 mg by mouth at bedtime.   fexofenadine 180 MG tablet Commonly known as: ALLEGRA Take 180 mg by mouth at bedtime.   Fluticasone-Salmeterol 250-50 MCG/DOSE Aepb Commonly known as: ADVAIR Inhale 1 puff into the lungs 2 (two) times daily.   folic acid 1 MG tablet Commonly known as: FOLVITE Take 1 mg by mouth daily.   furosemide 20 MG tablet Commonly known as: LASIX Take 20 mg by mouth daily.   guaiFENesin 600 MG 12 hr tablet Commonly known as: MUCINEX Take 600 mg by mouth every 12 (twelve) hours.   Mucinex Maximum Strength 1200 MG Tb12 Generic drug: Guaifenesin Take 1,200 mg by mouth every 12 (twelve) hours as needed (cough, congestion).   Incruse Ellipta 62.5 MCG/ACT Aepb Generic drug: umeclidinium bromide Inhale 1 puff into the lungs daily.   ipratropium-albuterol 0.5-2.5 (3) MG/3ML Soln Commonly known as: DUONEB Take 3 mLs by nebulization every 6 (six) hours as needed (wheezing).   LACTOBACILLUS PO Take 1 tablet by mouth daily.   leflunomide 10 MG tablet Commonly known as: ARAVA Take 10 mg by mouth daily.   levothyroxine 150 MCG tablet Commonly known as: SYNTHROID Take 150 mcg by mouth daily.   loperamide 2 MG tablet Commonly known as: IMODIUM A-D Take 2 mg by mouth every 8 (eight) hours as needed for diarrhea or loose stools.   losartan 25 MG tablet Commonly known as: COZAAR Take 25 mg by mouth daily.   metoprolol succinate 50 MG 24  hr tablet Commonly known as: TOPROL-XL Take 1 tablet (50 mg total) by mouth 2 (two) times daily. Take with or immediately following a meal.   metoprolol tartrate 50 MG tablet Commonly known as: LOPRESSOR Take 50 mg by mouth 2 (two) times daily.   naproxen sodium 220 MG tablet Commonly known as: ALEVE Take 220 mg by mouth See admin instructions. 2 entries on MAR : 1) 220 mg once daily every morning 0900 2) 220 mg every 8 hours as needed for pain (must be 8 hours after scheduled dose)   ondansetron 4 MG tablet Commonly known as: ZOFRAN Take 4 mg by mouth every 8 (eight) hours as needed for nausea or vomiting.   oxyCODONE 5 MG immediate release tablet Commonly known as: Oxy IR/ROXICODONE Take 1 tablet (5 mg total) by mouth every 4 (four) hours as needed for moderate pain.   potassium chloride SA 20 MEQ tablet Commonly known as: KLOR-CON M Take 2 tablets (40  mEq total) by mouth 2 (two) times daily. What changed:  how much to take when to take this   pravastatin 40 MG tablet Commonly known as: PRAVACHOL Take 40 mg by mouth at bedtime.   predniSONE 5 MG tablet Commonly known as: DELTASONE Take 5 mg by mouth 2 (two) times daily.   Vitamin D3 50 MCG (2000 UT) Tabs Take 2,000 Units by mouth daily.        Follow-up Information     Aleatha Borer A, NP Follow up in 1 week(s).   Specialty: Nurse Practitioner Contact information: [address] [place] [address] [phone]                Allergies  Allergen Reactions   Celestone [Betamethasone] Hives   Decadron [Dexamethasone] Hives   Latex Rash    Blistering rash   Remicade [Infliximab] Rash and Other (See Comments)    Remicade = "She went to rehab because of it"   Tape Rash    Blistering rash   Tylenol [Acetaminophen] Other (See Comments)    Not effective for pt   Z-Pak [Azithromycin] Other (See Comments)    Unknown reaction   Zestril [Lisinopril] Other (See Comments)     Unknown reaction   Claritin [Loratadine] Other (See Comments)    Not effective for pt   Wound Dressing Adhesive Rash    Blistering rash    Consultations: Neurology   Procedures/Studies: ECHOCARDIOGRAM COMPLETE  Result Date: 03/21/2023    ECHOCARDIOGRAM REPORT   Patient Name:   ALEXZANDRA PANDA Date of Exam: 03/21/2023 Medical Rec #:  [id number]       Height:       66.0 in Accession #:    [id number]      Weight:       250.0 lb Date of Birth:  1936/04/06       BSA:          2.199 m Patient Age:    86 years        BP:           189/136 mmHg Patient Gender: F               HR:           67 bpm. Exam Location:  Inpatient Procedure: 2D Echo, Cardiac Doppler and Color Doppler Indications:    stroke  History:        Patient has no prior history of Echocardiogram examinations.                 COPD and chronic kindey disease, Arrythmias:Atrial Fibrillation;                 Risk Factors:Hypertension and Dyslipidemia.  Sonographer:    Delcie Roch RDCS Referring Phys: [id number] Clearence Vitug  Sonographer Comments: Patient is obese. Image acquisition challenging due to patient body habitus. IMPRESSIONS  1. Left ventricular ejection fraction, by estimation, is 60 to 65%. The left ventricle has normal function. The left ventricle has no regional wall motion abnormalities. There is mild left ventricular hypertrophy. Left ventricular diastolic parameters are consistent with Grade I diastolic dysfunction (impaired relaxation).  2. Right ventricular systolic function is normal. The right ventricular size is normal.  3. Left atrial size was mildly dilated.  4. A small pericardial effusion is present. There is no evidence of cardiac tamponade.  5. No evidence of mitral valve regurgitation. Moderate mitral annular calcification.  6. The aortic valve was not well  visualized. Aortic valve regurgitation is not visualized.  7. The inferior vena cava is normal in size with greater than 50% respiratory variability, suggesting right  atrial pressure of 3 mmHg. Conclusion(s)/Recommendation(s): If there is a high suspicion for a cardioembolic source, consider limited TTE with a bubble study. FINDINGS  Left Ventricle: Left ventricular ejection fraction, by estimation, is 60 to 65%. The left ventricle has normal function. The left ventricle has no regional wall motion abnormalities. The left ventricular internal cavity size was normal in size. There is  mild left ventricular hypertrophy. Left ventricular diastolic parameters are consistent with Grade I diastolic dysfunction (impaired relaxation). Right Ventricle: The right ventricular size is normal. Right ventricular systolic function is normal. Left Atrium: Left atrial size was mildly dilated. Right Atrium: Right atrial size was normal in size. Pericardium: A small pericardial effusion is present. There is no evidence of cardiac tamponade. Mitral Valve: Moderate mitral annular calcification. No evidence of mitral valve regurgitation. Tricuspid Valve: Tricuspid valve regurgitation is not demonstrated. Aortic Valve: The aortic valve was not well visualized. Aortic valve regurgitation is not visualized. Pulmonic Valve: Pulmonic valve regurgitation is not visualized. Aorta: The aortic root and ascending aorta are structurally normal, with no evidence of dilitation. Venous: The inferior vena cava is normal in size with greater than 50% respiratory variability, suggesting right atrial pressure of 3 mmHg. IAS/Shunts: The interatrial septum was not well visualized.  LEFT VENTRICLE PLAX 2D LVIDd:         5.30 cm   Diastology LVIDs:         3.60 cm   LV e' medial:    4.57 cm/s LV PW:         1.30 cm   LV E/e' medial:  20.0 LV IVS:        1.30 cm   LV e' lateral:   5.55 cm/s LVOT diam:     2.10 cm   LV E/e' lateral: 16.5 LV SV:         57 LV SV Index:   26 LVOT Area:     3.46 cm  RIGHT VENTRICLE             IVC RV S prime:     11.40 cm/s  IVC diam: 1.10 cm TAPSE (M-mode): 1.4 cm LEFT ATRIUM              Index LA Vol (A2C):   72.4 ml 32.93 ml/m LA Vol (A4C):   72.2 ml 32.84 ml/m LA Biplane Vol: 73.9 ml 33.61 ml/m  AORTIC VALVE LVOT Vmax:   82.50 cm/s LVOT Vmean:  52.800 cm/s LVOT VTI:    0.165 m  AORTA Ao Root diam: 3.40 cm Ao Asc diam:  3.30 cm MITRAL VALVE MV Area (PHT): 3.65 cm     SHUNTS MV Decel Time: 208 msec     Systemic VTI:  0.16 m MV E velocity: 91.30 cm/s   Systemic Diam: 2.10 cm MV A velocity: 124.00 cm/s MV E/A ratio:  0.74 Photographer signed by Carolan Clines Signature Date/Time: 03/21/2023/11:33:48 AM    Final    CT ANGIO HEAD NECK W WO CM  Result Date: 03/21/2023 CLINICAL DATA:  TIA, determine embolic source EXAM: CT ANGIOGRAPHY HEAD AND NECK WITH AND WITHOUT CONTRAST TECHNIQUE: Multidetector CT imaging of the head and neck was performed using the standard protocol during bolus administration of intravenous contrast. Multiplanar CT image reconstructions and MIPs were obtained to evaluate the vascular anatomy. Carotid stenosis measurements (when applicable)  are obtained utilizing NASCET criteria, using the distal internal carotid diameter as the denominator. RADIATION DOSE REDUCTION: This exam was performed according to the departmental dose-optimization program which includes automated exposure control, adjustment of the mA and/or kV according to patient size and/or use of iterative reconstruction technique. CONTRAST:  60mL OMNIPAQUE IOHEXOL 350 MG/ML SOLN COMPARISON:  Brain MRI from yesterday FINDINGS: CT HEAD FINDINGS Brain: Patient's acute parietal infarct on the right is indistinguishable by CT. Chronic small vessel ischemia and cerebral volume loss. No hemorrhage, hydrocephalus, or masslike finding Vascular: See below Skull: Unremarkable Sinuses/Orbits: Unremarkable Review of the MIP images confirms the above findings CTA NECK FINDINGS Aortic arch: Atheromatous plaque with 3 vessel branching Right carotid system: ICA beading without acute dissection or flow limiting stenosis.  Mild atherosclerosis at the bifurcation. Left carotid system: ICA beading and tortuosity. Limited atheromatous plaque for age. No stenosis or dissection. Vertebral arteries: No proximal subclavian stenosis. The right vertebral artery is dominant. Vertebral tortuosity without stenosis or dissection. Skeleton: Ordinary degenerative changes.  No acute finding Other neck: No acute finding Upper chest: Clear apical lungs Review of the MIP images confirms the above findings CTA HEAD FINDINGS Anterior circulation: No significant stenosis, proximal occlusion, aneurysm, or vascular malformation. Atheromatous calcification along the carotid siphons Posterior circulation: Dominant right vertebral artery. The vertebral and basilar arteries are smoothly contoured and widely patent. No branch occlusion, beading, or aneurysm. Venous sinuses: Unremarkable for arterial timing. Anatomic variants: None significant Review of the MIP images confirms the above findings IMPRESSION: 1. No emergent vascular finding. 2. Subjectively mild atherosclerosis for age. No flow limiting stenosis of major arteries in the head and neck. 3. Fibromuscular dysplasia of the cervical carotids. Electronically Signed   By: Tiburcio Pea M.D.   On: 03/21/2023 08:10   MR BRAIN WO CONTRAST  Result Date: 03/20/2023 CLINICAL DATA:  Transient ischemic attack EXAM: MRI HEAD WITHOUT CONTRAST TECHNIQUE: Multiplanar, multiecho pulse sequences of the brain and surrounding structures were obtained without intravenous contrast. COMPARISON:  None Available. FINDINGS: Brain: Punctate focus of abnormal diffusion restriction within the posterior right parietal lobe. No acute or chronic hemorrhage. There is multifocal hyperintense T2-weighted signal within the white matter. Generalized volume loss. The midline structures are normal. Vascular: Major flow voids are preserved. Skull and upper cervical spine: Normal calvarium and skull base. Visualized upper cervical spine  and soft tissues are normal. Sinuses/Orbits:No paranasal sinus fluid levels or advanced mucosal thickening. No mastoid or middle ear effusion. Normal orbits. IMPRESSION: 1. Punctate focus of acute ischemia within the posterior right parietal lobe. No hemorrhage or mass effect. 2. Findings of chronic small vessel ischemia and volume loss. Electronically Signed   By: Deatra Robinson M.D.   On: 03/20/2023 20:44   CT HEAD WO CONTRAST  Result Date: 03/20/2023 CLINICAL DATA:  Stroke, follow up EXAM: CT HEAD WITHOUT CONTRAST TECHNIQUE: Contiguous axial images were obtained from the base of the skull through the vertex without intravenous contrast. RADIATION DOSE REDUCTION: This exam was performed according to the departmental dose-optimization program which includes automated exposure control, adjustment of the mA and/or kV according to patient size and/or use of iterative reconstruction technique. COMPARISON:  None Available. FINDINGS: Brain: No evidence of acute infarction, hemorrhage, hydrocephalus, extra-axial collection or mass lesion/mass effect. Sequela of moderate chronic microvascular ischemic change with a possible age indeterminate infarcts in the centrum semiovale on the left (series 5, image 44). Vascular: No hyperdense vessel or unexpected calcification. Skull: Normal. Negative for fracture or focal lesion. Sinuses/Orbits:  No middle ear or mastoid effusion. Paranasal sinuses are clear. Bilateral lens replacement. Orbits are otherwise. Other: None. IMPRESSION: Sequela of moderate chronic microvascular ischemic change with a possible age indeterminate infarcts in the centrum semiovale on the left. If there is concern for acute infarct, consider further evaluation with brain MRI. Electronically Signed   By: Lorenza Cambridge M.D.   On: 03/20/2023 14:07     Discharge Exam: Vitals:   03/21/23 0808 03/21/23 1206  BP: (!) 189/136 (!) 153/87  Pulse: 74 70  Resp: 20 18  Temp: 98.2 F (36.8 C) 98.7 F (37.1 C)   SpO2: 97% 98%   Vitals:   03/20/23 2339 03/21/23 0401 03/21/23 0808 03/21/23 1206  BP: (!) 160/96 (!) 164/99 (!) 189/136 (!) 153/87  Pulse: 74 65 74 70  Resp:   20 18  Temp: (!) 97.5 F (36.4 C) 97.7 F (36.5 C) 98.2 F (36.8 C) 98.7 F (37.1 C)  TempSrc: Oral Oral Oral Oral  SpO2: 99% 98% 97% 98%  Weight:      Height:        General: Pt is alert, awake, not in acute distress Cardiovascular: RRR, S1/S2 +, no rubs, no gallops Respiratory: CTA bilaterally, no wheezing, no rhonchi Abdominal: Soft, NT, ND, bowel sounds + Extremities: no edema, no cyanosis    The results of significant diagnostics from this hospitalization (including imaging, microbiology, ancillary and laboratory) are listed below for reference.     Microbiology: No results found for this or any previous visit (from the past 240 hour(s)).   Labs: BNP (last 3 results) No results for input(s): "BNP" in the last 8760 hours. Basic Metabolic Panel: Recent Labs  Lab 03/20/23 1134 03/20/23 1237  NA 142 140  K 4.3 4.0  CL 108 108  CO2 23  --   GLUCOSE 218* 221*  BUN 19 20  CREATININE 1.24* 1.20*  CALCIUM 9.3  --    Liver Function Tests: Recent Labs  Lab 03/20/23 1134  AST 16  ALT 21  ALKPHOS 109  BILITOT 0.3  PROT 5.5*  ALBUMIN 3.1*   No results for input(s): "LIPASE", "AMYLASE" in the last 168 hours. No results for input(s): "AMMONIA" in the last 168 hours. CBC: Recent Labs  Lab 03/20/23 1134 03/20/23 1237  WBC 9.7  --   NEUTROABS 8.1*  --   HGB 13.6 14.3  HCT 44.4 42.0  MCV 93.5  --   PLT 204  --    Cardiac Enzymes: No results for input(s): "CKTOTAL", "CKMB", "CKMBINDEX", "TROPONINI" in the last 168 hours. BNP: Invalid input(s): "POCBNP" CBG: No results for input(s): "GLUCAP" in the last 168 hours. D-Dimer No results for input(s): "DDIMER" in the last 72 hours. Hgb A1c Recent Labs    03/20/23 1856  HGBA1C 6.7*   Lipid Profile Recent Labs    03/21/23 0358  CHOL 147   HDL 45  LDLCALC 55  TRIG 236*  CHOLHDL 3.3   Thyroid function studies No results for input(s): "TSH", "T4TOTAL", "T3FREE", "THYROIDAB" in the last 72 hours.  Invalid input(s): "FREET3" Anemia work up No results for input(s): "VITAMINB12", "FOLATE", "FERRITIN", "TIBC", "IRON", "RETICCTPCT" in the last 72 hours. Urinalysis    Component Value Date/Time   COLORURINE YELLOW 03/20/2023 1233   APPEARANCEUR CLEAR 03/20/2023 1233   LABSPEC 1.018 03/20/2023 1233   PHURINE 5.0 03/20/2023 1233   GLUCOSEU NEGATIVE 03/20/2023 1233   HGBUR NEGATIVE 03/20/2023 1233   BILIRUBINUR NEGATIVE 03/20/2023 1233   KETONESUR NEGATIVE 03/20/2023 1233  PROTEINUR NEGATIVE 03/20/2023 1233   NITRITE NEGATIVE 03/20/2023 1233   LEUKOCYTESUR NEGATIVE 03/20/2023 1233   Sepsis Labs Recent Labs  Lab 03/20/23 1134  WBC 9.7   Microbiology No results found for this or any previous visit (from the past 240 hour(s)).  FURTHER DISCHARGE INSTRUCTIONS:   Get Medicines reviewed and adjusted: Please take all your medications with you for your next visit with your Primary MD   Laboratory/radiological data: Please request your Primary MD to go over all hospital tests and procedure/radiological results at the follow up, please ask your Primary MD to get all Hospital records sent to his/her office.   In some cases, they will be blood work, cultures and biopsy results pending at the time of your discharge. Please request that your primary care M.D. goes through all the records of your hospital data and follows up on these results.   Also Note the following: If you experience worsening of your admission symptoms, develop shortness of breath, life threatening emergency, suicidal or homicidal thoughts you must seek medical attention immediately by calling 911 or calling your MD immediately  if symptoms less severe.   You must read complete instructions/literature along with all the possible adverse reactions/side  effects for all the Medicines you take and that have been prescribed to you. Take any new Medicines after you have completely understood and accpet all the possible adverse reactions/side effects.    Do not drive when taking Pain medications or sleeping medications (Benzodaizepines)   Do not take more than prescribed Pain, Sleep and Anxiety Medications. It is not advisable to combine anxiety,sleep and pain medications without talking with your primary care practitioner   Special Instructions: If you have smoked or chewed Tobacco  in the last 2 yrs please stop smoking, stop any regular Alcohol  and or any Recreational drug use.   Wear Seat belts while driving.   Please note: You were cared for by a hospitalist during your hospital stay. Once you are discharged, your primary care physician will handle any further medical issues. Please note that NO REFILLS for any discharge medications will be authorized once you are discharged, as it is imperative that you return to your primary care physician (or establish a relationship with a primary care physician if you do not have one) for your post hospital discharge needs so that they can reassess your need for medications and monitor your lab values  Time coordinating discharge: Over 30 minutes  SIGNED:   Hughie Closs, MD  [facility] 03/21/2023, 12:07 PM *Please note that this is a verbal dictation therefore any spelling or grammatical errors are due to the "[employer] One" system interpretation. If 7PM-7AM, please contact night-coverage [email or web address]

## 2023-03-21 NOTE — TOC Initial Note (Signed)
Transition of Care [facility]) - Initial/Assessment Note    Patient Details  Name: Kristina May MRN: [id number] Date of Birth: 11-02-1935  Transition of Care [facility]) CM/SW Contact:    Baldemar Lenis, LCSW Phone Number: 03/21/2023, 11:12 AM  Clinical Narrative:          CSW attempted to meet with patient to discuss disposition, but she is off unit for test. CSW spoke with daughter, Arline Asp. Patient is from [facility]. Staff assists with showers, but patient is able to dress herself and feed herself. She ambulates with a rollator from her lift chair to the bathroom, which is only about 6 feet, and then back to her chair. Staff bring meals to her room. In the past, she has not been interested in participating with home health therapy. CSW relayed information to PT/OT assigned to see patient today. Plan is to hopefully return to [facility] when stable, patient will need PTAR transfer. CSW to follow.         Expected Discharge Plan: Assisted Living Barriers to Discharge: Continued Medical Work up   Patient Goals and CMS Choice   CMS Medicare.gov Compare Post Acute Care list provided to:: Patient Represenative (must comment) Choice offered to / list presented to : Adult Children [employer] ownership interest in [facility].provided to:: Adult Children    Expected Discharge Plan and Services     Post Acute Care Choice: NA Living arrangements for the past 2 months: Assisted Living Facility                                      Prior Living Arrangements/Services Living arrangements for the past 2 months: Assisted Living Facility Lives with:: Facility Resident Patient language and need for interpreter reviewed:: No Do you feel safe going back to the place where you live?: Yes      Need for Family Participation in Patient Care: No (Comment) Care giver support system in place?: Yes (comment) Current home services: DME Criminal Activity/Legal Involvement Pertinent  to Current Situation/Hospitalization: No - Comment as needed  Activities of Daily Living      Permission Sought/Granted Permission sought to share information with : Facility Medical sales representative, Family Supports Permission granted to share information with : Yes, Verbal Permission Granted  Share Information with NAME: Arline Asp  Permission granted to share info w AGENCY: [name]  Permission granted to share info w Relationship: Daughter     Emotional Assessment Appearance:: Appears stated age Attitude/Demeanor/Rapport: Paranoid Affect (typically observed): Appropriate Orientation: : Oriented to Self, Oriented to Place, Oriented to  Time, Oriented to Situation Alcohol / Substance Use: Not Applicable Psych Involvement: No (comment)  Admission diagnosis:  TIA (transient ischemic attack) [G45.9] Patient Active Problem List   Diagnosis Date Noted   Acute ischemic stroke (HCC) 03/21/2023   Pressure injury of skin 03/21/2023   Chronic heart failure with preserved ejection fraction (HCC) 07/19/2021   Chronic pain syndrome 07/19/2021   Right lower lobe pulmonary nodule 05/26/2021   Lab test positive for detection of COVID-19 virus    Pancreatitis 12/09/2020   Palpitations 09/13/2020   Stage 3a chronic kidney disease (HCC) 07/03/2019   MDD (major depressive disorder), recurrent episode, moderate (HCC)    Hypokalemia 05/13/2019   QT prolongation 05/13/2019   UTI (urinary tract infection) 05/13/2019   Suicidal ideation 05/13/2019   Venous stasis 05/13/2019   MDD (major depressive disorder), recurrent severe,  without psychosis (HCC)    Atrial fibrillation (HCC) 02/14/2018   Chronic obstructive pulmonary disease (HCC) 02/14/2018   Right knee pain 10/12/2013   Arthritis pain, hand 10/12/2013   Unspecified essential hypertension 10/12/2013   Unspecified hypothyroidism 10/12/2013   Dyslipidemia 10/12/2013   Rheumatoid arthritis (HCC)    Polyarthritis 10/11/2013   PCP:   Jettie Pagan, NP Pharmacy:   [facility] [phone] Ginette Otto, [place] - [address] [place] [address] Phone: [phone] Fax: [phone]  [facility] [address] [place] [address] Phone: [phone] Fax: [phone]     Social Determinants of Health (SDOH) Social History: SDOH Screenings   Tobacco Use: Medium Risk (03/20/2023)   SDOH Interventions:     Readmission Risk Interventions     No data to display

## 2023-03-21 NOTE — Plan of Care (Signed)

## 2023-03-21 NOTE — Evaluation (Signed)
Occupational Therapy Evaluation and Discharge Patient Details Name: Kristina May MRN: [id number] DOB: 1936/02/20 Today's Date: 03/21/2023   History of Present Illness Pt is an 87 y.o. female who presented 03/20/23 from [facility] with slurred speech and AMS, which resolved. MRI revealed punctate focus of acute ischemia within the posterior right parietal lobe. PMH: afib on Eliquis, PE, COPD, HTN, RA, A-fib.   Clinical Impression   This 87 yo female admitted with above presents to acute OT neuro symptoms resolved and reports she is back to her baseline from and ADL perspective and that if she does need more A she can ask for it. She is not interested in continued OT services. No further OT needs, we will sign off.     Recommendations for follow up therapy are one component of a multi-disciplinary discharge planning process, led by the attending physician.  Recommendations may be updated based on patient status, additional functional criteria and insurance authorization.   Assistance Recommended at Discharge PRN  Patient can return home with the following A little help with bathing/dressing/bathroom    Functional Status Assessment  Patient has not had a recent decline in their functional status (per her report)  Equipment Recommendations  None recommended by OT       Precautions / Restrictions Precautions Precautions: Fall Restrictions Weight Bearing Restrictions: No             ADL either performed or assessed with clinical judgement   ADL                                         General ADL Comments: Pt reports she can do most of her B/D/Toileting on her own via rollator and if she needs A she can ask for it. She is not interested in further OT at her facility because she knows what she can and can't do and does not want to be pushed to do more.     Vision Baseline Vision/History: 0 No visual deficits              Pertinent Vitals/Pain  Pain Assessment Pain Assessment: No/denies pain     Hand Dominance Right   Extremity/Trunk Assessment Upper Extremity Assessment Upper Extremity Assessment: Overall WFL for tasks assessed     Communication Communication Communication: No difficulties   Cognition Arousal/Alertness: Awake/alert Behavior During Therapy: WFL for tasks assessed/performed Overall Cognitive Status: No family/caregiver present to determine baseline cognitive functioning                                 General Comments: Pt reports she can manage most of what she needs to do on her own but also states she will not push herself.                Home Living Family/patient expects to be discharged to:: Assisted living                             Home Equipment: Rollator (4 wheels);Rolling Walker (2 wheels);Wheelchair - power          Prior Functioning/Environment Prior Level of Function : Needs assist             Mobility Comments: Able to ambulate short bedroom distances with a rollator mod I. Just  got an electric w/c and wants to learn to use it for longer mobility distances ADLs Comments: takes herself to the bathroom; needs assistance with bathing (reports more A with bathing than dressing); facility does medications and food                 OT Goals(Current goals can be found in the care plan section) Acute Rehab OT Goals Patient Stated Goal: to go back to facility and not have follow up therapy         AM-PAC OT "6 Clicks" Daily Activity     Outcome Measure Help from another person eating meals?: None Help from another person taking care of personal grooming?: None Help from another person toileting, which includes using toliet, bedpan, or urinal?: None Help from another person bathing (including washing, rinsing, drying)?: A Little Help from another person to put on and taking off regular upper body clothing?: None Help from another person to put on  and taking off regular lower body clothing?: A Little 6 Click Score: 22   End of Session    Activity Tolerance: Patient tolerated treatment well Patient left: in bed;with call bell/phone within reach;with bed alarm set  OT Visit Diagnosis: Muscle weakness (generalized) (M62.81)                Time: 1610-9604 OT Time Calculation (min): 10 min Charges:  OT General Charges $OT Visit: 1 Visit OT Evaluation $OT Eval Low Complexity: 1 Low  Lindon Romp OT Acute Rehabilitation Services Office [phone]    Evette Georges 03/21/2023, 4:04 PM

## 2023-03-21 NOTE — NC FL2 (Addendum)
[place] MEDICAID FL2 LEVEL OF CARE FORM     IDENTIFICATION  Patient Name: Kristina May Birthdate: Jun 23, 1936 Sex: female Admission Date (Current Location): 03/20/2023  [facility] and [place] Number:  Producer, television/film/video and Address:  [facility] [place], [address] [place], [place] [address]      Provider Number: [id number]  Attending Physician Name and Address:  Hughie Closs, MD  Relative Name and Phone Number:       Current Level of Care: Hospital Recommended Level of Care: Assisted Living Facility Prior Approval Number:    Date Approved/Denied:   PASRR Number:    Discharge Plan: Other (Comment) (ALF)    Current Diagnoses: Patient Active Problem List   Diagnosis Date Noted   Acute ischemic stroke (HCC) 03/21/2023   Pressure injury of skin 03/21/2023   Chronic heart failure with preserved ejection fraction (HCC) 07/19/2021   Chronic pain syndrome 07/19/2021   Right lower lobe pulmonary nodule 05/26/2021   Lab test positive for detection of COVID-19 virus    Pancreatitis 12/09/2020   Palpitations 09/13/2020   Stage 3a chronic kidney disease (HCC) 07/03/2019   MDD (major depressive disorder), recurrent episode, moderate (HCC)    Hypokalemia 05/13/2019   QT prolongation 05/13/2019   UTI (urinary tract infection) 05/13/2019   Suicidal ideation 05/13/2019   Venous stasis 05/13/2019   MDD (major depressive disorder), recurrent severe, without psychosis (HCC)    Atrial fibrillation (HCC) 02/14/2018   Chronic obstructive pulmonary disease (HCC) 02/14/2018   Right knee pain 10/12/2013   Arthritis pain, hand 10/12/2013   Unspecified essential hypertension 10/12/2013   Unspecified hypothyroidism 10/12/2013   Dyslipidemia 10/12/2013   Rheumatoid arthritis (HCC)    Polyarthritis 10/11/2013    Orientation RESPIRATION BLADDER Height & Weight     Self, Time, Place  Normal Continent Weight: 250 lb (113.4 kg) Height:  5\' 6"  (167.6 cm)   BEHAVIORAL SYMPTOMS/MOOD NEUROLOGICAL BOWEL NUTRITION STATUS      Continent Diet (no salt)  AMBULATORY STATUS COMMUNICATION OF NEEDS Skin   Supervision Verbally PU Stage and Appropriate Care PU Stage 1 Dressing: No Dressing (bilateral sacrum)                     Personal Care Assistance Level of Assistance  Bathing, Feeding, Dressing Bathing Assistance: Limited assistance Feeding assistance: Limited assistance Dressing Assistance: Independent     Functional Limitations Info             SPECIAL CARE FACTORS FREQUENCY                       Contractures Contractures Info: Not present    Additional Factors Info  Code Status, Allergies Code Status Info: DNR Allergies Info: Celestone (Betamethasone); Decadron (Dexamethasone); Latex  Remicade (Infliximab); Tape; Tylenol (Acetaminophen); Z-pak (Azithromycin); Zestril (Lisinopril); Claritin (Loratadine); Wound Dressing Adhesive             Discharge Medications: TAKE these medications     albuterol 108 (90 Base) MCG/ACT inhaler Commonly known as: VENTOLIN HFA Inhale 2 puffs into the lungs every 6 (six) hours as needed for wheezing or shortness of breath.    apixaban 5 MG Tabs tablet Commonly known as: ELIQUIS Take 5 mg by mouth 2 (two) times daily.    aspirin EC 81 MG tablet Take 81 mg by mouth daily.    Calcium 600/Vitamin D3 600-20 MG-MCG Tabs Generic drug: Calcium Carb-Cholecalciferol Take 1 tablet by mouth at bedtime.  calcium carbonate 1250 (500 Ca) MG tablet Commonly known as: OS-CAL - dosed in mg of elemental calcium Take 500 mg of elemental calcium by mouth at bedtime.    calcium carbonate 750 MG chewable tablet Commonly known as: TUMS EX Chew 750 mg by mouth every 12 (twelve) hours as needed for heartburn.    CARBOXYMETHYLCELLULOSE SODIUM OP Place 1 drop into both eyes every 8 (eight) hours as needed (dry eyes).    DULoxetine 30 MG capsule Commonly known as: CYMBALTA Take 30 mg by  mouth at bedtime.    fexofenadine 180 MG tablet Commonly known as: ALLEGRA Take 180 mg by mouth at bedtime.    Fluticasone-Salmeterol 250-50 MCG/DOSE Aepb Commonly known as: ADVAIR Inhale 1 puff into the lungs 2 (two) times daily.    folic acid 1 MG tablet Commonly known as: FOLVITE Take 1 mg by mouth daily.    furosemide 20 MG tablet Commonly known as: LASIX Take 20 mg by mouth daily.    guaiFENesin 600 MG 12 hr tablet Commonly known as: MUCINEX Take 600 mg by mouth every 12 (twelve) hours.    Mucinex Maximum Strength 1200 MG Tb12 Generic drug: Guaifenesin Take 1,200 mg by mouth every 12 (twelve) hours as needed (cough, congestion).    Incruse Ellipta 62.5 MCG/ACT Aepb Generic drug: umeclidinium bromide Inhale 1 puff into the lungs daily.    ipratropium-albuterol 0.5-2.5 (3) MG/3ML Soln Commonly known as: DUONEB Take 3 mLs by nebulization every 6 (six) hours as needed (wheezing).    LACTOBACILLUS PO Take 1 tablet by mouth daily.    leflunomide 10 MG tablet Commonly known as: ARAVA Take 10 mg by mouth daily.    levothyroxine 150 MCG tablet Commonly known as: SYNTHROID Take 150 mcg by mouth daily.    loperamide 2 MG tablet Commonly known as: IMODIUM A-D Take 2 mg by mouth every 8 (eight) hours as needed for diarrhea or loose stools.    losartan 25 MG tablet Commonly known as: COZAAR Take 25 mg by mouth daily.    metoprolol succinate 50 MG 24 hr tablet Commonly known as: TOPROL-XL Take 1 tablet (50 mg total) by mouth 2 (two) times daily. Take with or immediately following a meal.    metoprolol tartrate 50 MG tablet Commonly known as: LOPRESSOR Take 50 mg by mouth 2 (two) times daily.    naproxen sodium 220 MG tablet Commonly known as: ALEVE Take 220 mg by mouth See admin instructions. 2 entries on MAR : 1) 220 mg once daily every morning 0900 2) 220 mg every 8 hours as needed for pain (must be 8 hours after scheduled dose)    ondansetron 4 MG  tablet Commonly known as: ZOFRAN Take 4 mg by mouth every 8 (eight) hours as needed for nausea or vomiting.    oxyCODONE 5 MG immediate release tablet Commonly known as: Oxy IR/ROXICODONE Take 1 tablet (5 mg total) by mouth every 4 (four) hours as needed for moderate pain.    potassium chloride SA 20 MEQ tablet Commonly known as: KLOR-CON M Take 2 tablets (40 mEq total) by mouth 2 (two) times daily. What changed:  how much to take when to take this    pravastatin 40 MG tablet Commonly known as: PRAVACHOL Take 40 mg by mouth at bedtime.    predniSONE 5 MG tablet Commonly known as: DELTASONE Take 5 mg by mouth 2 (two) times daily.    Vitamin D3 50 MCG (2000 UT) Tabs Take 2,000 Units by mouth  daily.    Relevant Imaging Results:  Relevant Lab Results:   Additional Information SS#: [id number]  Baldemar Lenis, LCSW

## 2023-03-21 NOTE — Progress Notes (Signed)
  Echocardiogram 2D Echocardiogram has been performed.  Delcie Roch 03/21/2023, 11:21 AM

## 2023-03-21 NOTE — Procedures (Signed)
Patient Name: Kristina May  MRN: [id number]  Epilepsy Attending: Charlsie Quest  Referring Physician/Provider: Marolyn Haller, MD  Date: 03/21/2023 Duration: 30.05 mins  Patient history: 87 y.o. female with PMH significant for afibb, PE on eliquis, COPD who presents with  episode of slurred speech and confusion. EEG to evaluate for seizure  Level of alertness: Awake, asleep   AEDs during EEG study: None  Technical aspects: This EEG study was done with scalp electrodes positioned according to the 10-20 International system of electrode placement. Electrical activity was reviewed with band pass filter of 1-70Hz , sensitivity of 7 uV/mm, display speed of 53mm/sec with a 60Hz  notched filter applied as appropriate. EEG data were recorded continuously and digitally stored.  Video monitoring was available and reviewed as appropriate.  Description: The posterior dominant rhythm consists of 7.5Hz  activity of moderate voltage (25-35 uV) seen predominantly in posterior head regions, symmetric and reactive to eye opening and eye closing. Sleep was characterized by vertex waves, sleep spindles (12 to 14 Hz), maximal frontocentral region. EEG showed intermittent generalized 3 to 6 Hz theta-delta slowing. Hyperventilation and photic stimulation were not performed.     ABNORMALITY - Intermittent slow, generalized  IMPRESSION: This study is suggestive of mild diffuse encephalopathy, nonspecific etiology. No seizures or epileptiform discharges were seen throughout the recording.  Pegah Segel Annabelle Harman

## 2023-03-22 DIAGNOSIS — I639 Cerebral infarction, unspecified: Secondary | ICD-10-CM | POA: Diagnosis not present

## 2023-03-22 MED ORDER — LOSARTAN POTASSIUM 25 MG PO TABS: 12.5000 mg | ORAL_TABLET | Freq: Every morning | ORAL | Status: DC

## 2023-03-22 NOTE — Discharge Summary (Signed)
Physician Discharge Summary  Kristina May ZOX:[id number] DOB: 06/14/1936 DOA: 03/20/2023  PCP: Jettie Pagan, NP  Admit date: 03/20/2023 Discharge date: 03/22/2023    Admitted From: ALF Disposition: ALF  Recommendations for Outpatient Follow-up:  Follow up with PCP in 1-2 weeks Please obtain BMP/CBC in one week Please follow up with your PCP on the following pending results: Unresulted Labs (From admission, onward)     Start     Ordered   Pending  MRSA Next Gen by PCR, Nasal  Once,   R        Pending              Home Health: None-patient declined Equipment/Devices: None  Discharge Condition: Stable CODE STATUS: DNR Diet recommendation: Cardiac  HPI: Kristina May is a 87 y.o. female with medical history significant of COPD, paroxysmal atrial fibrillation on Eliquis, hypertension, hyperlipidemia, history of PE, chronic systolic congestive heart failure who lives at a nursing home was brought in to the emergency department due to episode of slurred speech and confusion.  According to the reports provided by the patient and patient's daughter at the bedside, patient was in her usual state of health when she slept last night but when she woke up this morning, she had above-mentioned symptoms but those symptoms lasted only 20 minutes however due to concern of stroke, she was presented to the emergency department.  Obviously, upon presentation, she was back at her baseline.  Patient endorses no other complaint.  During my evaluation, patient offers no symptoms.   ED Course: Upon presentation to ED, she was slightly hypertensive, labs were within normal range, CT head was done which was concerning for possible stroke so MRI was ordered and hospital service were consulted for admission.  Subjective: Patient seen and examined.  No complaints.  At her baseline.  Brief/Interim Summary: Patient was admitted for stroke workup.  MRI showed punctate focus of acute ischemia within  posterior right parietal lobe, no mass or hemorrhage.  Seen by neurology.  They recommended resuming PTA home medications including Eliquis.  No changes in medications.  She was seen by PT OT and they recommended home health but patient declined that as well.  Her blood pressure is slightly elevated at the time of discharge since we are holding her antihypertensives to allow permissive hypertension for first 48 hours which will end tomorrow morning, patient can resume her antihypertensive medications tomorrow.  Patient is totally asymptomatic.  I also discussed discharge plan with the daughter over the phone.  Addendum: On the afternoon of 03/21/2023 around 2:30 PM, patient was noted to have slurred speech.  Neuro was called at the bedside and she was assessed by myself as well.  This lasted only for 2 minutes and her speech returned back to normal.  Neuro had thought possibility of seizures so stat EEG was done which was unremarkable.  According to Dr. Pearlean Brownie of neurology, this may be primary progressive aphasia given baseline cognitive impairment with recurrent speech difficulties and he recommended outpatient neurology follow-up and he cleared the patient for discharge however patient remained in the hospital overnight because for some reason, there was some issue with transportation.  Patient is stable, seen and examined by myself and now she is going to be transported back to assisted living facility.  Family is aware.  Discharge Diagnoses:  Active Problems:   Dyslipidemia   Rheumatoid arthritis (HCC)   Atrial fibrillation (HCC)   Stage 3a chronic kidney disease (HCC)   Acute  ischemic stroke (HCC)   Pressure injury of skin    Discharge Instructions   Allergies as of 03/22/2023       Reactions   Celestone [betamethasone] Hives   Decadron [dexamethasone] Hives   Latex Rash   Blistering rash   Remicade [infliximab] Rash, Other (See Comments)   Remicade = "She went to rehab because of it"    Tape Rash   Blistering rash   Tylenol [acetaminophen] Other (See Comments)   Not effective for pt   Z-pak [azithromycin] Other (See Comments)   Unknown reaction   Zestril [lisinopril] Other (See Comments)   Unknown reaction   Claritin [loratadine] Other (See Comments)   Not effective for pt   Wound Dressing Adhesive Rash   Blistering rash        Medication List     TAKE these medications    albuterol 108 (90 Base) MCG/ACT inhaler Commonly known as: VENTOLIN HFA Inhale 2 puffs into the lungs every 6 (six) hours as needed for wheezing or shortness of breath.   apixaban 5 MG Tabs tablet Commonly known as: ELIQUIS Take 5 mg by mouth 2 (two) times daily.   aspirin EC 81 MG tablet Take 81 mg by mouth daily.   Calcium 600/Vitamin D3 600-20 MG-MCG Tabs Generic drug: Calcium Carb-Cholecalciferol Take 1 tablet by mouth at bedtime.   calcium carbonate 1250 (500 Ca) MG tablet Commonly known as: OS-CAL - dosed in mg of elemental calcium Take 500 mg of elemental calcium by mouth at bedtime.   calcium carbonate 750 MG chewable tablet Commonly known as: TUMS EX Chew 750 mg by mouth every 12 (twelve) hours as needed for heartburn.   CARBOXYMETHYLCELLULOSE SODIUM OP Place 1 drop into both eyes every 8 (eight) hours as needed (dry eyes).   DULoxetine 30 MG capsule Commonly known as: CYMBALTA Take 30 mg by mouth at bedtime.   fexofenadine 180 MG tablet Commonly known as: ALLEGRA Take 180 mg by mouth at bedtime.   Fluticasone-Salmeterol 250-50 MCG/DOSE Aepb Commonly known as: ADVAIR Inhale 1 puff into the lungs 2 (two) times daily.   folic acid 1 MG tablet Commonly known as: FOLVITE Take 1 mg by mouth daily.   furosemide 20 MG tablet Commonly known as: LASIX Take 20 mg by mouth daily.   guaiFENesin 600 MG 12 hr tablet Commonly known as: MUCINEX Take 600 mg by mouth every 12 (twelve) hours.   Mucinex Maximum Strength 1200 MG Tb12 Generic drug: Guaifenesin Take 1,200  mg by mouth every 12 (twelve) hours as needed (cough, congestion).   Incruse Ellipta 62.5 MCG/ACT Aepb Generic drug: umeclidinium bromide Inhale 1 puff into the lungs daily.   ipratropium-albuterol 0.5-2.5 (3) MG/3ML Soln Commonly known as: DUONEB Take 3 mLs by nebulization every 6 (six) hours as needed (wheezing).   LACTOBACILLUS PO Take 1 tablet by mouth daily.   leflunomide 10 MG tablet Commonly known as: ARAVA Take 10 mg by mouth daily.   levothyroxine 150 MCG tablet Commonly known as: SYNTHROID Take 150 mcg by mouth daily.   loperamide 2 MG tablet Commonly known as: IMODIUM A-D Take 2 mg by mouth every 8 (eight) hours as needed for diarrhea or loose stools.   losartan 25 MG tablet Commonly known as: COZAAR Take 25 mg by mouth daily.   metoprolol succinate 50 MG 24 hr tablet Commonly known as: TOPROL-XL Take 1 tablet (50 mg total) by mouth 2 (two) times daily. Take with or immediately following a meal.   metoprolol  tartrate 50 MG tablet Commonly known as: LOPRESSOR Take 50 mg by mouth 2 (two) times daily.   naproxen sodium 220 MG tablet Commonly known as: ALEVE Take 220 mg by mouth See admin instructions. 2 entries on MAR : 1) 220 mg once daily every morning 0900 2) 220 mg every 8 hours as needed for pain (must be 8 hours after scheduled dose)   ondansetron 4 MG tablet Commonly known as: ZOFRAN Take 4 mg by mouth every 8 (eight) hours as needed for nausea or vomiting.   oxyCODONE 5 MG immediate release tablet Commonly known as: Oxy IR/ROXICODONE Take 1 tablet (5 mg total) by mouth every 4 (four) hours as needed for moderate pain.   potassium chloride SA 20 MEQ tablet Commonly known as: KLOR-CON M Take 2 tablets (40 mEq total) by mouth 2 (two) times daily. What changed:  how much to take when to take this   pravastatin 40 MG tablet Commonly known as: PRAVACHOL Take 40 mg by mouth at bedtime.   predniSONE 5 MG tablet Commonly known as: DELTASONE Take  5 mg by mouth 2 (two) times daily.   Vitamin D3 50 MCG (2000 UT) Tabs Take 2,000 Units by mouth daily.        Follow-up Information     Aleatha Borer A, NP Follow up in 1 week(s).   Specialty: Nurse Practitioner Contact information: [address] [place] [address] [phone]                Allergies  Allergen Reactions   Celestone [Betamethasone] Hives   Decadron [Dexamethasone] Hives   Latex Rash    Blistering rash   Remicade [Infliximab] Rash and Other (See Comments)    Remicade = "She went to rehab because of it"   Tape Rash    Blistering rash   Tylenol [Acetaminophen] Other (See Comments)    Not effective for pt   Z-Pak [Azithromycin] Other (See Comments)    Unknown reaction   Zestril [Lisinopril] Other (See Comments)    Unknown reaction   Claritin [Loratadine] Other (See Comments)    Not effective for pt   Wound Dressing Adhesive Rash    Blistering rash    Consultations: Neurology   Procedures/Studies: EEG adult  Result Date: 03-24-23 Charlsie Quest, MD     03-24-23  3:31 PM Patient Name: Thelma Lorenzetti MRN: [id number] Epilepsy Attending: Charlsie Quest Referring Physician/Provider: Marolyn Haller, MD Date: 03-24-23 Duration: 30.05 mins Patient history: 87 y.o. female with PMH significant for afibb, PE on eliquis, COPD who presents with  episode of slurred speech and confusion. EEG to evaluate for seizure Level of alertness: Awake, asleep AEDs during EEG study: None Technical aspects: This EEG study was done with scalp electrodes positioned according to the 10-20 International system of electrode placement. Electrical activity was reviewed with band pass filter of 1-70Hz , sensitivity of 7 uV/mm, display speed of 30mm/sec with a 60Hz  notched filter applied as appropriate. EEG data were recorded continuously and digitally stored.  Video monitoring was available and reviewed as appropriate. Description: The posterior  dominant rhythm consists of 7.5Hz  activity of moderate voltage (25-35 uV) seen predominantly in posterior head regions, symmetric and reactive to eye opening and eye closing. Sleep was characterized by vertex waves, sleep spindles (12 to 14 Hz), maximal frontocentral region. EEG showed intermittent generalized 3 to 6 Hz theta-delta slowing. Hyperventilation and photic stimulation were not performed.   ABNORMALITY - Intermittent slow, generalized IMPRESSION: This study  is suggestive of mild diffuse encephalopathy, nonspecific etiology. No seizures or epileptiform discharges were seen throughout the recording. Charlsie Quest   ECHOCARDIOGRAM COMPLETE  Result Date: 03/21/2023    ECHOCARDIOGRAM REPORT   Patient Name:   SHAKEEMA LIPPMAN Date of Exam: 03/21/2023 Medical Rec #:  [id number]       Height:       66.0 in Accession #:    [id number]      Weight:       250.0 lb Date of Birth:  21-Jun-1936       BSA:          2.199 m Patient Age:    86 years        BP:           189/136 mmHg Patient Gender: F               HR:           67 bpm. Exam Location:  Inpatient Procedure: 2D Echo, Cardiac Doppler and Color Doppler Indications:    stroke  History:        Patient has no prior history of Echocardiogram examinations.                 COPD and chronic kindey disease, Arrythmias:Atrial Fibrillation;                 Risk Factors:Hypertension and Dyslipidemia.  Sonographer:    Delcie Roch RDCS Referring Phys: [id number] Sion Thane  Sonographer Comments: Patient is obese. Image acquisition challenging due to patient body habitus. IMPRESSIONS  1. Left ventricular ejection fraction, by estimation, is 60 to 65%. The left ventricle has normal function. The left ventricle has no regional wall motion abnormalities. There is mild left ventricular hypertrophy. Left ventricular diastolic parameters are consistent with Grade I diastolic dysfunction (impaired relaxation).  2. Right ventricular systolic function is normal. The right  ventricular size is normal.  3. Left atrial size was mildly dilated.  4. A small pericardial effusion is present. There is no evidence of cardiac tamponade.  5. No evidence of mitral valve regurgitation. Moderate mitral annular calcification.  6. The aortic valve was not well visualized. Aortic valve regurgitation is not visualized.  7. The inferior vena cava is normal in size with greater than 50% respiratory variability, suggesting right atrial pressure of 3 mmHg. Conclusion(s)/Recommendation(s): If there is a high suspicion for a cardioembolic source, consider limited TTE with a bubble study. FINDINGS  Left Ventricle: Left ventricular ejection fraction, by estimation, is 60 to 65%. The left ventricle has normal function. The left ventricle has no regional wall motion abnormalities. The left ventricular internal cavity size was normal in size. There is  mild left ventricular hypertrophy. Left ventricular diastolic parameters are consistent with Grade I diastolic dysfunction (impaired relaxation). Right Ventricle: The right ventricular size is normal. Right ventricular systolic function is normal. Left Atrium: Left atrial size was mildly dilated. Right Atrium: Right atrial size was normal in size. Pericardium: A small pericardial effusion is present. There is no evidence of cardiac tamponade. Mitral Valve: Moderate mitral annular calcification. No evidence of mitral valve regurgitation. Tricuspid Valve: Tricuspid valve regurgitation is not demonstrated. Aortic Valve: The aortic valve was not well visualized. Aortic valve regurgitation is not visualized. Pulmonic Valve: Pulmonic valve regurgitation is not visualized. Aorta: The aortic root and ascending aorta are structurally normal, with no evidence of dilitation. Venous: The inferior vena cava is normal in size with greater than 50% respiratory variability,  suggesting right atrial pressure of 3 mmHg. IAS/Shunts: The interatrial septum was not well visualized.   LEFT VENTRICLE PLAX 2D LVIDd:         5.30 cm   Diastology LVIDs:         3.60 cm   LV e' medial:    4.57 cm/s LV PW:         1.30 cm   LV E/e' medial:  20.0 LV IVS:        1.30 cm   LV e' lateral:   5.55 cm/s LVOT diam:     2.10 cm   LV E/e' lateral: 16.5 LV SV:         57 LV SV Index:   26 LVOT Area:     3.46 cm  RIGHT VENTRICLE             IVC RV S prime:     11.40 cm/s  IVC diam: 1.10 cm TAPSE (M-mode): 1.4 cm LEFT ATRIUM             Index LA Vol (A2C):   72.4 ml 32.93 ml/m LA Vol (A4C):   72.2 ml 32.84 ml/m LA Biplane Vol: 73.9 ml 33.61 ml/m  AORTIC VALVE LVOT Vmax:   82.50 cm/s LVOT Vmean:  52.800 cm/s LVOT VTI:    0.165 m  AORTA Ao Root diam: 3.40 cm Ao Asc diam:  3.30 cm MITRAL VALVE MV Area (PHT): 3.65 cm     SHUNTS MV Decel Time: 208 msec     Systemic VTI:  0.16 m MV E velocity: 91.30 cm/s   Systemic Diam: 2.10 cm MV A velocity: 124.00 cm/s MV E/A ratio:  0.74 Photographer signed by Carolan Clines Signature Date/Time: 03/21/2023/11:33:48 AM    Final    CT ANGIO HEAD NECK W WO CM  Result Date: 03/21/2023 CLINICAL DATA:  TIA, determine embolic source EXAM: CT ANGIOGRAPHY HEAD AND NECK WITH AND WITHOUT CONTRAST TECHNIQUE: Multidetector CT imaging of the head and neck was performed using the standard protocol during bolus administration of intravenous contrast. Multiplanar CT image reconstructions and MIPs were obtained to evaluate the vascular anatomy. Carotid stenosis measurements (when applicable) are obtained utilizing NASCET criteria, using the distal internal carotid diameter as the denominator. RADIATION DOSE REDUCTION: This exam was performed according to the departmental dose-optimization program which includes automated exposure control, adjustment of the mA and/or kV according to patient size and/or use of iterative reconstruction technique. CONTRAST:  60mL OMNIPAQUE IOHEXOL 350 MG/ML SOLN COMPARISON:  Brain MRI from yesterday FINDINGS: CT HEAD FINDINGS Brain: Patient's acute  parietal infarct on the right is indistinguishable by CT. Chronic small vessel ischemia and cerebral volume loss. No hemorrhage, hydrocephalus, or masslike finding Vascular: See below Skull: Unremarkable Sinuses/Orbits: Unremarkable Review of the MIP images confirms the above findings CTA NECK FINDINGS Aortic arch: Atheromatous plaque with 3 vessel branching Right carotid system: ICA beading without acute dissection or flow limiting stenosis. Mild atherosclerosis at the bifurcation. Left carotid system: ICA beading and tortuosity. Limited atheromatous plaque for age. No stenosis or dissection. Vertebral arteries: No proximal subclavian stenosis. The right vertebral artery is dominant. Vertebral tortuosity without stenosis or dissection. Skeleton: Ordinary degenerative changes.  No acute finding Other neck: No acute finding Upper chest: Clear apical lungs Review of the MIP images confirms the above findings CTA HEAD FINDINGS Anterior circulation: No significant stenosis, proximal occlusion, aneurysm, or vascular malformation. Atheromatous calcification along the carotid siphons Posterior circulation: Dominant right vertebral artery. The vertebral  and basilar arteries are smoothly contoured and widely patent. No branch occlusion, beading, or aneurysm. Venous sinuses: Unremarkable for arterial timing. Anatomic variants: None significant Review of the MIP images confirms the above findings IMPRESSION: 1. No emergent vascular finding. 2. Subjectively mild atherosclerosis for age. No flow limiting stenosis of major arteries in the head and neck. 3. Fibromuscular dysplasia of the cervical carotids. Electronically Signed   By: Tiburcio Pea M.D.   On: 03/21/2023 08:10   MR BRAIN WO CONTRAST  Result Date: 03/20/2023 CLINICAL DATA:  Transient ischemic attack EXAM: MRI HEAD WITHOUT CONTRAST TECHNIQUE: Multiplanar, multiecho pulse sequences of the brain and surrounding structures were obtained without intravenous  contrast. COMPARISON:  None Available. FINDINGS: Brain: Punctate focus of abnormal diffusion restriction within the posterior right parietal lobe. No acute or chronic hemorrhage. There is multifocal hyperintense T2-weighted signal within the white matter. Generalized volume loss. The midline structures are normal. Vascular: Major flow voids are preserved. Skull and upper cervical spine: Normal calvarium and skull base. Visualized upper cervical spine and soft tissues are normal. Sinuses/Orbits:No paranasal sinus fluid levels or advanced mucosal thickening. No mastoid or middle ear effusion. Normal orbits. IMPRESSION: 1. Punctate focus of acute ischemia within the posterior right parietal lobe. No hemorrhage or mass effect. 2. Findings of chronic small vessel ischemia and volume loss. Electronically Signed   By: Deatra Robinson M.D.   On: 03/20/2023 20:44   CT HEAD WO CONTRAST  Result Date: 03/20/2023 CLINICAL DATA:  Stroke, follow up EXAM: CT HEAD WITHOUT CONTRAST TECHNIQUE: Contiguous axial images were obtained from the base of the skull through the vertex without intravenous contrast. RADIATION DOSE REDUCTION: This exam was performed according to the departmental dose-optimization program which includes automated exposure control, adjustment of the mA and/or kV according to patient size and/or use of iterative reconstruction technique. COMPARISON:  None Available. FINDINGS: Brain: No evidence of acute infarction, hemorrhage, hydrocephalus, extra-axial collection or mass lesion/mass effect. Sequela of moderate chronic microvascular ischemic change with a possible age indeterminate infarcts in the centrum semiovale on the left (series 5, image 44). Vascular: No hyperdense vessel or unexpected calcification. Skull: Normal. Negative for fracture or focal lesion. Sinuses/Orbits: No middle ear or mastoid effusion. Paranasal sinuses are clear. Bilateral lens replacement. Orbits are otherwise. Other: None. IMPRESSION:  Sequela of moderate chronic microvascular ischemic change with a possible age indeterminate infarcts in the centrum semiovale on the left. If there is concern for acute infarct, consider further evaluation with brain MRI. Electronically Signed   By: Lorenza Cambridge M.D.   On: 03/20/2023 14:07     Discharge Exam: Vitals:   03/21/23 2336 03/22/23 0342  BP: (!) 173/111 (!) 145/122  Pulse: 62 70  Resp: 18 18  Temp: 98 F (36.7 C) 97.6 F (36.4 C)  SpO2: 94% 97%   Vitals:   03/21/23 1931 03/21/23 1956 03/21/23 2336 03/22/23 0342  BP:  (!) 159/93 (!) 173/111 (!) 145/122  Pulse:  64 62 70  Resp:  18 18 18   Temp:  97.9 F (36.6 C) 98 F (36.7 C) 97.6 F (36.4 C)  TempSrc:  Oral Oral Oral  SpO2: 96% 93% 94% 97%  Weight:      Height:        General: Pt is alert, awake, not in acute distress Cardiovascular: RRR, S1/S2 +, no rubs, no gallops Respiratory: CTA bilaterally, no wheezing, no rhonchi Abdominal: Soft, NT, ND, bowel sounds + Extremities: no edema, no cyanosis    The results of  significant diagnostics from this hospitalization (including imaging, microbiology, ancillary and laboratory) are listed below for reference.     Microbiology: No results found for this or any previous visit (from the past 240 hour(s)).   Labs: BNP (last 3 results) No results for input(s): "BNP" in the last 8760 hours. Basic Metabolic Panel: Recent Labs  Lab 03/20/23 1134 03/20/23 1237  NA 142 140  K 4.3 4.0  CL 108 108  CO2 23  --   GLUCOSE 218* 221*  BUN 19 20  CREATININE 1.24* 1.20*  CALCIUM 9.3  --     Liver Function Tests: Recent Labs  Lab 03/20/23 1134  AST 16  ALT 21  ALKPHOS 109  BILITOT 0.3  PROT 5.5*  ALBUMIN 3.1*    No results for input(s): "LIPASE", "AMYLASE" in the last 168 hours. No results for input(s): "AMMONIA" in the last 168 hours. CBC: Recent Labs  Lab 03/20/23 1134 03/20/23 1237  WBC 9.7  --   NEUTROABS 8.1*  --   HGB 13.6 14.3  HCT 44.4 42.0   MCV 93.5  --   PLT 204  --     Cardiac Enzymes: No results for input(s): "CKTOTAL", "CKMB", "CKMBINDEX", "TROPONINI" in the last 168 hours. BNP: Invalid input(s): "POCBNP" CBG: No results for input(s): "GLUCAP" in the last 168 hours. D-Dimer No results for input(s): "DDIMER" in the last 72 hours. Hgb A1c Recent Labs    03/20/23 1856  HGBA1C 6.7*    Lipid Profile Recent Labs    03/21/23 0358  CHOL 147  HDL 45  LDLCALC 55  TRIG 236*  CHOLHDL 3.3    Thyroid function studies No results for input(s): "TSH", "T4TOTAL", "T3FREE", "THYROIDAB" in the last 72 hours.  Invalid input(s): "FREET3" Anemia work up No results for input(s): "VITAMINB12", "FOLATE", "FERRITIN", "TIBC", "IRON", "RETICCTPCT" in the last 72 hours. Urinalysis    Component Value Date/Time   COLORURINE YELLOW 03/20/2023 1233   APPEARANCEUR CLEAR 03/20/2023 1233   LABSPEC 1.018 03/20/2023 1233   PHURINE 5.0 03/20/2023 1233   GLUCOSEU NEGATIVE 03/20/2023 1233   HGBUR NEGATIVE 03/20/2023 1233   BILIRUBINUR NEGATIVE 03/20/2023 1233   KETONESUR NEGATIVE 03/20/2023 1233   PROTEINUR NEGATIVE 03/20/2023 1233   NITRITE NEGATIVE 03/20/2023 1233   LEUKOCYTESUR NEGATIVE 03/20/2023 1233   Sepsis Labs Recent Labs  Lab 03/20/23 1134  WBC 9.7    Microbiology No results found for this or any previous visit (from the past 240 hour(s)).  FURTHER DISCHARGE INSTRUCTIONS:   Get Medicines reviewed and adjusted: Please take all your medications with you for your next visit with your Primary MD   Laboratory/radiological data: Please request your Primary MD to go over all hospital tests and procedure/radiological results at the follow up, please ask your Primary MD to get all Hospital records sent to his/her office.   In some cases, they will be blood work, cultures and biopsy results pending at the time of your discharge. Please request that your primary care M.D. goes through all the records of your hospital  data and follows up on these results.   Also Note the following: If you experience worsening of your admission symptoms, develop shortness of breath, life threatening emergency, suicidal or homicidal thoughts you must seek medical attention immediately by calling 911 or calling your MD immediately  if symptoms less severe.   You must read complete instructions/literature along with all the possible adverse reactions/side effects for all the Medicines you take and that have been prescribed to  you. Take any new Medicines after you have completely understood and accpet all the possible adverse reactions/side effects.    Do not drive when taking Pain medications or sleeping medications (Benzodaizepines)   Do not take more than prescribed Pain, Sleep and Anxiety Medications. It is not advisable to combine anxiety,sleep and pain medications without talking with your primary care practitioner   Special Instructions: If you have smoked or chewed Tobacco  in the last 2 yrs please stop smoking, stop any regular Alcohol  and or any Recreational drug use.   Wear Seat belts while driving.   Please note: You were cared for by a hospitalist during your hospital stay. Once you are discharged, your primary care physician will handle any further medical issues. Please note that NO REFILLS for any discharge medications will be authorized once you are discharged, as it is imperative that you return to your primary care physician (or establish a relationship with a primary care physician if you do not have one) for your post hospital discharge needs so that they can reassess your need for medications and monitor your lab values  Time coordinating discharge: Over 30 minutes  SIGNED:   Hughie Closs, MD  [facility] 03/22/2023, 8:40 AM *Please note that this is a verbal dictation therefore any spelling or grammatical errors are due to the "Dragon Medical One" system interpretation. If 7PM-7AM, please  contact night-coverage [email or web address]

## 2023-03-22 NOTE — Plan of Care (Signed)

## 2023-05-30 ENCOUNTER — Inpatient Hospital Stay (HOSPITAL_COMMUNITY)
Admission: EM | Admit: 2023-05-30 | Discharge: 2023-06-01 | DRG: 308 | Disposition: A | Discharge status: Skilled Nursing Facility | Payer: Medicare Other | Source: Skilled Nursing Facility | Attending: Family Medicine | Admitting: Family Medicine

## 2023-05-30 ENCOUNTER — Emergency Department (HOSPITAL_COMMUNITY): Payer: Medicare Other

## 2023-05-30 ENCOUNTER — Other Ambulatory Visit: Payer: Self-pay

## 2023-05-30 DIAGNOSIS — Z91048 Other nonmedicinal substance allergy status: Secondary | ICD-10-CM

## 2023-05-30 DIAGNOSIS — Z79890 Hormone replacement therapy (postmenopausal): Secondary | ICD-10-CM | POA: Diagnosis not present

## 2023-05-30 DIAGNOSIS — Z881 Allergy status to other antibiotic agents status: Secondary | ICD-10-CM | POA: Diagnosis not present

## 2023-05-30 DIAGNOSIS — Z7984 Long term (current) use of oral hypoglycemic drugs: Secondary | ICD-10-CM

## 2023-05-30 DIAGNOSIS — F8081 Childhood onset fluency disorder: Secondary | ICD-10-CM | POA: Diagnosis present

## 2023-05-30 DIAGNOSIS — Z6841 Body Mass Index (BMI) 40.0 and over, adult: Secondary | ICD-10-CM | POA: Diagnosis not present

## 2023-05-30 DIAGNOSIS — Z820 Family history of epilepsy and other diseases of the nervous system: Secondary | ICD-10-CM

## 2023-05-30 DIAGNOSIS — G459 Transient cerebral ischemic attack, unspecified: Secondary | ICD-10-CM | POA: Diagnosis not present

## 2023-05-30 DIAGNOSIS — F32A Depression, unspecified: Secondary | ICD-10-CM | POA: Diagnosis present

## 2023-05-30 DIAGNOSIS — Z87891 Personal history of nicotine dependence: Secondary | ICD-10-CM | POA: Diagnosis not present

## 2023-05-30 DIAGNOSIS — R4701 Aphasia: Principal | ICD-10-CM

## 2023-05-30 DIAGNOSIS — M069 Rheumatoid arthritis, unspecified: Secondary | ICD-10-CM | POA: Diagnosis present

## 2023-05-30 DIAGNOSIS — Z7951 Long term (current) use of inhaled steroids: Secondary | ICD-10-CM

## 2023-05-30 DIAGNOSIS — Z96642 Presence of left artificial hip joint: Secondary | ICD-10-CM | POA: Diagnosis present

## 2023-05-30 DIAGNOSIS — R9431 Abnormal electrocardiogram [ECG] [EKG]: Secondary | ICD-10-CM | POA: Diagnosis present

## 2023-05-30 DIAGNOSIS — E785 Hyperlipidemia, unspecified: Secondary | ICD-10-CM | POA: Diagnosis present

## 2023-05-30 DIAGNOSIS — N1831 Chronic kidney disease, stage 3a: Secondary | ICD-10-CM | POA: Diagnosis present

## 2023-05-30 DIAGNOSIS — Z7901 Long term (current) use of anticoagulants: Secondary | ICD-10-CM

## 2023-05-30 DIAGNOSIS — J449 Chronic obstructive pulmonary disease, unspecified: Secondary | ICD-10-CM | POA: Diagnosis present

## 2023-05-30 DIAGNOSIS — Z86718 Personal history of other venous thrombosis and embolism: Secondary | ICD-10-CM

## 2023-05-30 DIAGNOSIS — Z86711 Personal history of pulmonary embolism: Secondary | ICD-10-CM

## 2023-05-30 DIAGNOSIS — I472 Ventricular tachycardia, unspecified: Secondary | ICD-10-CM | POA: Diagnosis present

## 2023-05-30 DIAGNOSIS — Z91040 Latex allergy status: Secondary | ICD-10-CM

## 2023-05-30 DIAGNOSIS — E039 Hypothyroidism, unspecified: Secondary | ICD-10-CM | POA: Diagnosis present

## 2023-05-30 DIAGNOSIS — Z66 Do not resuscitate: Secondary | ICD-10-CM | POA: Diagnosis present

## 2023-05-30 DIAGNOSIS — Z515 Encounter for palliative care: Secondary | ICD-10-CM

## 2023-05-30 DIAGNOSIS — I499 Cardiac arrhythmia, unspecified: Secondary | ICD-10-CM | POA: Diagnosis not present

## 2023-05-30 DIAGNOSIS — K7460 Unspecified cirrhosis of liver: Secondary | ICD-10-CM | POA: Diagnosis present

## 2023-05-30 DIAGNOSIS — I480 Paroxysmal atrial fibrillation: Secondary | ICD-10-CM | POA: Diagnosis present

## 2023-05-30 DIAGNOSIS — I493 Ventricular premature depolarization: Secondary | ICD-10-CM | POA: Diagnosis present

## 2023-05-30 DIAGNOSIS — R1310 Dysphagia, unspecified: Secondary | ICD-10-CM | POA: Diagnosis present

## 2023-05-30 DIAGNOSIS — Z79899 Other long term (current) drug therapy: Secondary | ICD-10-CM

## 2023-05-30 DIAGNOSIS — G9341 Metabolic encephalopathy: Secondary | ICD-10-CM | POA: Diagnosis present

## 2023-05-30 DIAGNOSIS — Z8673 Personal history of transient ischemic attack (TIA), and cerebral infarction without residual deficits: Secondary | ICD-10-CM

## 2023-05-30 DIAGNOSIS — R410 Disorientation, unspecified: Secondary | ICD-10-CM

## 2023-05-30 DIAGNOSIS — I130 Hypertensive heart and chronic kidney disease with heart failure and stage 1 through stage 4 chronic kidney disease, or unspecified chronic kidney disease: Secondary | ICD-10-CM | POA: Diagnosis present

## 2023-05-30 DIAGNOSIS — Z7982 Long term (current) use of aspirin: Secondary | ICD-10-CM

## 2023-05-30 DIAGNOSIS — I5042 Chronic combined systolic (congestive) and diastolic (congestive) heart failure: Secondary | ICD-10-CM | POA: Diagnosis present

## 2023-05-30 LAB — TROPONIN I (HIGH SENSITIVITY)
Troponin I (High Sensitivity): 28 ng/L — ABNORMAL HIGH (ref <18)
Troponin I (High Sensitivity): 24 ng/L — ABNORMAL HIGH (ref <18)

## 2023-05-30 LAB — CBC
HCT: 42.6 % (ref 36.0–46.0)
HCT: 41.7 % (ref 36.0–46.0)
Hemoglobin: 13.6 g/dL (ref 12.0–15.0)
Hemoglobin: 13.1 g/dL (ref 12.0–15.0)
MCH: 29.7 pg (ref 26.0–34.0)
MCH: 29 pg (ref 26.0–34.0)
MCHC: 31.9 g/dL (ref 30.0–36.0)
MCHC: 31.4 g/dL (ref 30.0–36.0)
MCV: 93 fL (ref 80.0–100.0)
MCV: 92.3 fL (ref 80.0–100.0)
Platelets: 255 10*3/uL (ref 150–400)
Platelets: 212 10*3/uL (ref 150–400)
RBC: 4.58 MIL/uL (ref 3.87–5.11)
RBC: 4.52 MIL/uL (ref 3.87–5.11)
RDW: 13.8 % (ref 11.5–15.5)
RDW: 13.8 % (ref 11.5–15.5)
WBC: 10.6 10*3/uL — ABNORMAL HIGH (ref 4.0–10.5)
WBC: 11.3 10*3/uL — ABNORMAL HIGH (ref 4.0–10.5)
nRBC: 0 % (ref 0.0–0.2)
nRBC: 0 % (ref 0.0–0.2)

## 2023-05-30 LAB — I-STAT CHEM 8, ED
BUN: 27 mg/dL — ABNORMAL HIGH (ref 8–23)
Calcium, Ion: 1.2 mmol/L (ref 1.15–1.40)
Chloride: 107 mmol/L (ref 98–111)
Creatinine, Ser: 1.3 mg/dL — ABNORMAL HIGH (ref 0.44–1.00)
Glucose, Bld: 87 mg/dL (ref 70–99)
HCT: 39 % (ref 36.0–46.0)
Hemoglobin: 13.3 g/dL (ref 12.0–15.0)
Potassium: 4.3 mmol/L (ref 3.5–5.1)
Sodium: 140 mmol/L (ref 135–145)
TCO2: 27 mmol/L (ref 22–32)

## 2023-05-30 LAB — CBG MONITORING, ED: Glucose-Capillary: 86 mg/dL (ref 70–99)

## 2023-05-30 LAB — CREATININE, SERUM
Creatinine, Ser: 1.13 mg/dL — ABNORMAL HIGH (ref 0.44–1.00)
GFR, Estimated: 48 mL/min — ABNORMAL LOW (ref >60)

## 2023-05-30 LAB — APTT: aPTT: 28 seconds (ref 24–36)

## 2023-05-30 LAB — COMPREHENSIVE METABOLIC PANEL
ALT: 20 U/L (ref 0–44)
AST: 15 U/L (ref 15–41)
Albumin: 3.2 g/dL — ABNORMAL LOW (ref 3.5–5.0)
Alkaline Phosphatase: 95 U/L (ref 38–126)
Anion gap: 11 (ref 5–15)
BUN: 24 mg/dL — ABNORMAL HIGH (ref 8–23)
CO2: 22 mmol/L (ref 22–32)
Calcium: 9.1 mg/dL (ref 8.9–10.3)
Chloride: 107 mmol/L (ref 98–111)
Creatinine, Ser: 1.27 mg/dL — ABNORMAL HIGH (ref 0.44–1.00)
GFR, Estimated: 41 mL/min — ABNORMAL LOW (ref >60)
Glucose, Bld: 87 mg/dL (ref 70–99)
Potassium: 4.1 mmol/L (ref 3.5–5.1)
Sodium: 140 mmol/L (ref 135–145)
Total Bilirubin: 0.7 mg/dL (ref 0.3–1.2)
Total Protein: 6 g/dL — ABNORMAL LOW (ref 6.5–8.1)

## 2023-05-30 LAB — DIFFERENTIAL
Abs Immature Granulocytes: 0.1 10*3/uL — ABNORMAL HIGH (ref 0.00–0.07)
Basophils Absolute: 0.1 10*3/uL (ref 0.0–0.1)
Basophils Relative: 1 %
Eosinophils Absolute: 0.2 10*3/uL (ref 0.0–0.5)
Eosinophils Relative: 2 %
Immature Granulocytes: 1 %
Lymphocytes Relative: 12 %
Lymphs Abs: 1.2 10*3/uL (ref 0.7–4.0)
Monocytes Absolute: 0.9 10*3/uL (ref 0.1–1.0)
Monocytes Relative: 9 %
Neutro Abs: 8.1 10*3/uL — ABNORMAL HIGH (ref 1.7–7.7)
Neutrophils Relative %: 75 %

## 2023-05-30 LAB — PROTIME-INR
INR: 1.1 (ref 0.8–1.2)
Prothrombin Time: 14 seconds (ref 11.4–15.2)

## 2023-05-30 LAB — ETHANOL: Alcohol, Ethyl (B): <10 mg/dL (ref <10)

## 2023-05-30 MED ORDER — SODIUM CHLORIDE 0.9 % IV SOLN: INTRAVENOUS | Status: DC

## 2023-05-30 MED ORDER — SENNOSIDES-DOCUSATE SODIUM 8.6-50 MG PO TABS: 1.0000 | ORAL_TABLET | Freq: Every evening | ORAL | Status: DC | PRN

## 2023-05-30 MED ORDER — SODIUM CHLORIDE 0.9% FLUSH: 3.0000 mL | Freq: Once | INTRAVENOUS | Status: DC

## 2023-05-30 MED ORDER — LEFLUNOMIDE 10 MG PO TABS
10.0000 mg | ORAL_TABLET | Freq: Every day | ORAL | Status: DC
  Administered 2023-05-31 – 2023-06-01 (×2): 10 mg via ORAL
  Filled 2023-05-30 (×2): qty 1

## 2023-05-30 MED ORDER — ASPIRIN 81 MG PO TBEC
81.0000 mg | DELAYED_RELEASE_TABLET | Freq: Every day | ORAL | Status: DC
  Administered 2023-05-31 – 2023-06-01 (×2): 81 mg via ORAL
  Filled 2023-05-30 (×2): qty 1

## 2023-05-30 MED ORDER — HEPARIN SODIUM (PORCINE) 5000 UNIT/ML IJ SOLN
5000.0000 [IU] | Freq: Three times a day (TID) | INTRAMUSCULAR | Status: DC
  Administered 2023-05-31: 5000 [IU] via SUBCUTANEOUS
  Filled 2023-05-30: qty 1

## 2023-05-30 MED ORDER — ACETAMINOPHEN 650 MG RE SUPP: 650.0000 mg | RECTAL | Status: DC | PRN

## 2023-05-30 MED ORDER — ACETAMINOPHEN 325 MG PO TABS
650.0000 mg | ORAL_TABLET | ORAL | Status: DC | PRN
  Administered 2023-05-31: 650 mg via ORAL
  Filled 2023-05-30: qty 2

## 2023-05-30 MED ORDER — ACETAMINOPHEN 160 MG/5ML PO SOLN: 650.0000 mg | ORAL | Status: DC | PRN

## 2023-05-30 MED ORDER — STROKE: EARLY STAGES OF RECOVERY BOOK: Freq: Once | Status: DC

## 2023-05-30 NOTE — ED Notes (Signed)
ED TO INPATIENT HANDOFF REPORT  ED Nurse Name and Phone #: [phone]  S Name/Age/Gender Kristina May 87 y.o. female Room/Bed: 035C/035C  Code Status   Code Status: Do not attempt resuscitation (DNR) - Comfort care  Home/SNF/Other Home Patient oriented to: self, place, time, and situation Is this baseline? Yes   Triage Complete: Triage complete  Chief Complaint TIA (transient ischemic attack) [G45.9]  Triage Note No notes on file   Allergies Allergies  Allergen Reactions   Celestone [Betamethasone] Hives   Decadron [Dexamethasone] Hives   Latex Rash    Blistering rash   Remicade [Infliximab] Rash and Other (See Comments)    Remicade = "She went to rehab because of it"   Tape Rash    Blistering rash   Tylenol [Acetaminophen] Other (See Comments)    Not effective for pt   Z-Pak [Azithromycin] Other (See Comments)    Unknown reaction   Zestril [Lisinopril] Other (See Comments)    Unknown reaction   Claritin [Loratadine] Other (See Comments)    Not effective for pt   Wound Dressing Adhesive Rash    Blistering rash    Level of Care/Admitting Diagnosis ED Disposition     ED Disposition  Admit   Condition  --   Comment  Hospital Area: [facility] [[id number]]  Level of Care: Progressive [102]  Admit to Progressive based on following criteria: NEUROLOGICAL AND NEUROSURGICAL complex patients with significant risk of instability, who do not meet ICU criteria, yet require close observation or frequent assessment (< / = every 2 - 4 hours) with medical / nursing intervention.  May admit patient to [facility] or [facility] if equivalent level of care is available:: No  Covid Evaluation: Asymptomatic - no recent exposure (last 10 days) testing not required  Diagnosis: TIA (transient ischemic attack) [960454]  Admitting Physician: Lurline Del [[id number]]  Attending Physician: Lurline Del [[id number]]  Certification:: I certify this patient will  need inpatient services for at least 2 midnights  Expected Medical Readiness: 06/03/2023          B Medical/Surgery History Past Medical History:  Diagnosis Date   A-fib (HCC)    COPD (chronic obstructive pulmonary disease) (HCC)    Dyslipidemia    Hypertension    Pancreatitis    PE (pulmonary thromboembolism) (HCC)    Rheumatoid arthritis(714.0)    Shingles    Thyroid disease    Past Surgical History:  Procedure Laterality Date   APPENDECTOMY     FOOT SURGERY     TOTAL HIP ARTHROPLASTY Left      A IV Location/Drains/Wounds Patient Lines/Drains/Airways Status     Active Line/Drains/Airways     Name Placement date Placement time Site Days   Peripheral IV 05/30/23 22 G Posterior;Right Forearm 05/30/23  1539  Forearm  less than 1            Intake/Output Last 24 hours No intake or output data in the 24 hours ending 05/30/23 1830  Labs/Imaging Results for orders placed or performed during the hospital encounter of 05/30/23 (from the past 48 hour(s))  Protime-INR     Status: None   Collection Time: 05/30/23 10:18 AM  Result Value Ref Range   Prothrombin Time 14.0 11.4 - 15.2 seconds   INR 1.1 0.8 - 1.2    Comment: (NOTE) INR goal varies based on device and disease states. Performed at [facility], [address], [place], [place] [address]   APTT  Status: None   Collection Time: 05/30/23 10:18 AM  Result Value Ref Range   aPTT 28 24 - 36 seconds    Comment: Performed at [facility], [address], [place], [place] [address]  CBC     Status: Abnormal   Collection Time: 05/30/23 10:18 AM  Result Value Ref Range   WBC 10.6 (H) 4.0 - 10.5 K/uL   RBC 4.58 3.87 - 5.11 MIL/uL   Hemoglobin 13.6 12.0 - 15.0 g/dL   HCT 60.4 54.0 - 98.1 %   MCV 93.0 80.0 - 100.0 fL   MCH 29.7 26.0 - 34.0 pg   MCHC 31.9 30.0 - 36.0 g/dL   RDW 19.1 47.8 - 29.5 %   Platelets 255 150 - 400 K/uL   nRBC 0.0 0.0 - 0.2 %    Comment: Performed at [facility]  [facility], [address], [place], [place] [address]  Differential     Status: Abnormal   Collection Time: 05/30/23 10:18 AM  Result Value Ref Range   Neutrophils Relative % 75 %   Neutro Abs 8.1 (H) 1.7 - 7.7 K/uL   Lymphocytes Relative 12 %   Lymphs Abs 1.2 0.7 - 4.0 K/uL   Monocytes Relative 9 %   Monocytes Absolute 0.9 0.1 - 1.0 K/uL   Eosinophils Relative 2 %   Eosinophils Absolute 0.2 0.0 - 0.5 K/uL   Basophils Relative 1 %   Basophils Absolute 0.1 0.0 - 0.1 K/uL   Immature Granulocytes 1 %   Abs Immature Granulocytes 0.10 (H) 0.00 - 0.07 K/uL    Comment: Performed at [facility], [address] [place], [place] [address]  Comprehensive metabolic panel     Status: Abnormal   Collection Time: 05/30/23 10:18 AM  Result Value Ref Range   Sodium 140 135 - 145 mmol/L   Potassium 4.1 3.5 - 5.1 mmol/L   Chloride 107 98 - 111 mmol/L   CO2 22 22 - 32 mmol/L   Glucose, Bld 87 70 - 99 mg/dL    Comment: Glucose reference range applies only to samples taken after fasting for at least 8 hours.   BUN 24 (H) 8 - 23 mg/dL   Creatinine, Ser 4.69 (H) 0.44 - 1.00 mg/dL   Calcium 9.1 8.9 - 62.9 mg/dL   Total Protein 6.0 (L) 6.5 - 8.1 g/dL   Albumin 3.2 (L) 3.5 - 5.0 g/dL   AST 15 15 - 41 U/L   ALT 20 0 - 44 U/L   Alkaline Phosphatase 95 38 - 126 U/L   Total Bilirubin 0.7 0.3 - 1.2 mg/dL   GFR, Estimated 41 (L) >60 mL/min    Comment: (NOTE) Calculated using the CKD-EPI Creatinine Equation (2021)    Anion gap 11 5 - 15    Comment: Performed at [facility], [address], [place], [place] [address]  Ethanol     Status: None   Collection Time: 05/30/23 10:18 AM  Result Value Ref Range   Alcohol, Ethyl (B) <10 <10 mg/dL    Comment: (NOTE) Lowest detectable limit for serum alcohol is 10 mg/dL.  For medical purposes only. Performed at [facility], [address] [place], [place] [address]   CBG monitoring, ED     Status: None   Collection Time: 05/30/23 10:18 AM  Result  Value Ref Range   Glucose-Capillary 86 70 - 99 mg/dL    Comment: Glucose reference range applies only to samples  taken after fasting for at least 8 hours.  Troponin I (High Sensitivity)     Status: Abnormal   Collection Time: 05/30/23 10:19 AM  Result Value Ref Range   Troponin I (High Sensitivity) 28 (H) <18 ng/L    Comment: (NOTE) Elevated high sensitivity troponin I (hsTnI) values and significant  changes across serial measurements may suggest ACS but many other  chronic and acute conditions are known to elevate hsTnI results.  Refer to the "Links" section for chest pain algorithms and additional  guidance. Performed at [facility], [address], [place], [place] [address]   I-stat chem 8, ED     Status: Abnormal   Collection Time: 05/30/23 10:27 AM  Result Value Ref Range   Sodium 140 135 - 145 mmol/L   Potassium 4.3 3.5 - 5.1 mmol/L   Chloride 107 98 - 111 mmol/L   BUN 27 (H) 8 - 23 mg/dL   Creatinine, Ser 9.56 (H) 0.44 - 1.00 mg/dL   Glucose, Bld 87 70 - 99 mg/dL    Comment: Glucose reference range applies only to samples taken after fasting for at least 8 hours.   Calcium, Ion 1.20 1.15 - 1.40 mmol/L   TCO2 27 22 - 32 mmol/L   Hemoglobin 13.3 12.0 - 15.0 g/dL   HCT 38.7 56.4 - 33.2 %  Troponin I (High Sensitivity)     Status: Abnormal   Collection Time: 05/30/23  3:38 PM  Result Value Ref Range   Troponin I (High Sensitivity) 24 (H) <18 ng/L    Comment: (NOTE) Elevated high sensitivity troponin I (hsTnI) values and significant  changes across serial measurements may suggest ACS but many other  chronic and acute conditions are known to elevate hsTnI results.  Refer to the "Links" section for chest pain algorithms and additional  guidance. Performed at [facility], [address] [place], [place] [address]   CBC     Status: Abnormal   Collection Time: 05/30/23  3:38 PM  Result Value Ref Range   WBC 11.3 (H) 4.0 - 10.5 K/uL   RBC 4.52 3.87 - 5.11  MIL/uL   Hemoglobin 13.1 12.0 - 15.0 g/dL   HCT 41.6 60.6 - 30.1 %   MCV 92.3 80.0 - 100.0 fL   MCH 29.0 26.0 - 34.0 pg   MCHC 31.4 30.0 - 36.0 g/dL   RDW 60.1 09.3 - 23.5 %   Platelets 212 150 - 400 K/uL   nRBC 0.0 0.0 - 0.2 %    Comment: Performed at [facility], [address] [place], [place] [address]  Creatinine, serum     Status: Abnormal   Collection Time: 05/30/23  3:38 PM  Result Value Ref Range   Creatinine, Ser 1.13 (H) 0.44 - 1.00 mg/dL   GFR, Estimated 48 (L) >60 mL/min    Comment: (NOTE) Calculated using the CKD-EPI Creatinine Equation (2021) Performed at [facility], [address], [place], [place] [address]    DG Chest Port 1 View  Result Date: 05/30/2023 CLINICAL DATA:  Weakness. EXAM: PORTABLE CHEST 1 VIEW COMPARISON:  12/09/2020. FINDINGS: Bilateral lung fields are clear. Bilateral lateral costophrenic angles are clear. Stable cardio-mediastinal silhouette. No acute osseous abnormalities. The soft tissues are within normal limits. IMPRESSION: No active disease. Electronically Signed   By: Jules Schick M.D.   On: 05/30/2023 11:47   CT HEAD CODE STROKE WO CONTRAST  Result Date: 05/30/2023 CLINICAL DATA:  Code stroke.  Confusion EXAM: CT HEAD WITHOUT CONTRAST TECHNIQUE: Contiguous axial images were obtained from the base of the skull through the vertex without intravenous contrast. RADIATION DOSE REDUCTION: This exam was performed according to the departmental dose-optimization program which includes automated exposure control, adjustment of the mA and/or kV according to patient size and/or use of iterative reconstruction technique. COMPARISON:  CT/CTA head and neck 03/21/2023 FINDINGS: Brain: There is no acute intracranial hemorrhage, extra-axial fluid collection, or acute territorial infarct Parenchymal volume is stable. The ventricles are stable in size. Gray-white differentiation is preserved. Hypodensity in the supratentorial white matter likely  reflecting sequela of underlying chronic small-vessel ischemic change is stable. 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 There is no mass lesion. There is no mass effect or midline shift. The pituitary and suprasellar region are normal. Vascular: There is calcification of the bilateral carotid siphons. Skull: Normal. Negative for fracture or focal lesion. Sinuses/Orbits: The paranasal sinuses are clear. Bilateral lens implants are in place. The globes and orbits are otherwise unremarkable. Other: The mastoid air cells and middle ear cavities are clear. ASPECTS [facility] Stroke Program Early CT Score) - Ganglionic level infarction (caudate, lentiform nuclei, internal capsule, insula, M1-M3 cortex): 7 - Supraganglionic infarction (M4-M6 cortex): 3 Total score (0-10 with 10 being normal): 10 IMPRESSION: No acute intracranial pathology. Findings communicated to Dr Derry Lory at 10:39 am. Electronically Signed   By: Lesia Hausen M.D.   On: 05/30/2023 10:42    Pending Labs Unresulted Labs (From admission, onward)     Start     Ordered   05/30/23 1521  Urinalysis, Routine w reflex microscopic -Urine, Clean Catch  Once,   R       Question:  Specimen Source  Answer:  Urine, Clean Catch   05/30/23 1521   05/30/23 1108  Urinalysis, Routine w reflex microscopic -Urine, Clean Catch  Once,   URGENT       Question:  Specimen Source  Answer:  Urine, Clean Catch   05/30/23 1107            Vitals/Pain Today's Vitals   05/30/23 1200 05/30/23 1430 05/30/23 1501 05/30/23 1700  BP: (!) 180/107 (!) 175/120 (!) 156/96   Pulse: (!) 58 (!) 59 71   Resp: 17 (!) 21 (!) 21   Temp:    97.9 F (36.6 C)  TempSrc:      SpO2: 100% 100% 96%   Weight:      PainSc:        Isolation Precautions No active isolations  Medications Medications  sodium chloride flush (NS) 0.9 % injection 3 mL (3 mLs Intravenous Not Given 05/30/23 1053)  aspirin EC tablet 81 mg (has no administration in  time range)  leflunomide (ARAVA) tablet 10 mg (has no administration in time range)  0.9 %  sodium chloride infusion (has no administration in time range)  acetaminophen (TYLENOL) tablet 650 mg (has no administration in time range)    Or  acetaminophen (TYLENOL) 160 MG/5ML solution 650 mg (has no administration in time range)    Or  acetaminophen (TYLENOL) suppository 650 mg (has no administration in time range)  senna-docusate (Senokot-S) tablet 1 tablet (has no administration in time range)  heparin injection 5,000 Units (has no administration in time range)    Mobility walks with device     Focused Assessments Neuro Assessment Handoff:  Swallow screen pass? Yes  Cardiac Rhythm: Sinus bradycardia NIH Stroke Scale  Dizziness Present: No Headache  Present: No Interval: Initial Level of Consciousness (1a.)   : Alert, keenly responsive LOC Questions (1b. )   : Answers both questions correctly LOC Commands (1c. )   : Performs both tasks correctly Best Gaze (2. )  : Normal Visual (3. )  : No visual loss Facial Palsy (4. )    : Normal symmetrical movements Motor Arm, Left (5a. )   : No drift Motor Arm, Right (5b. ) : No drift Motor Leg, Left (6a. )  : No drift Motor Leg, Right (6b. ) : No drift Limb Ataxia (7. ): Absent Sensory (8. )  : Normal, no sensory loss Best Language (9. )  : No aphasia Dysarthria (10. ): Normal Extinction/Inattention (11.)   : No Abnormality Complete NIHSS TOTAL: 0 Last date known well: 05/30/23 Last time known well: 0900 Neuro Assessment: Within Defined Limits Neuro Checks:   Initial (05/30/23 1030)  Has TPA been given? No If patient is a Neuro Trauma and patient is going to OR before floor call report to 4N Charge nurse: [phone] or [phone]   R Recommendations: See Admitting Provider Note  Report given to:   Additional Notes:

## 2023-05-30 NOTE — Code Documentation (Signed)
Stroke Response Nurse Documentation Code Documentation  Crystallee Mancillas is a 87 y.o. female arriving to [facility]  via [facility] on 05/30/23 with past medical hx of hypertension, hyperlipidemia, paroxysmal A-fib on Eliquis, chronic lower extremity edema, RA, history of PE, history of DVT, COPD, obesity . On Eliquis (apixaban) daily. Code stroke was activated by EMS.   Patient from nursing home where she was LKW at 0900 and now complaining of Aphasia, slurred speech .   Stroke team at the bedside on patient arrival. Labs drawn and patient cleared for CT by Dr. Rodena Medin. Patient to CT with team. NIHSS 0, see documentation for details and code stroke times. The following imaging was completed:  CT Head. Patient is not a candidate for IV Thrombolytic due to on Eliquis, symptoms resolved. Patient is not a candidate for IR due to no LVO suspected.   Code Stroke cancelled at 1033  [facility]  Stroke Response RN

## 2023-05-30 NOTE — ED Provider Notes (Signed)
[place] [facility]ERGENC[facility] DEPARTMENT AT [facility] Provider Note   CSN: [id number] Arrival date & time: 05/30/23  1015     History  Chief Complaint  Patient presents with   Code Stroke    Kristina May is a 87 y.o. female.  87 year old female with primary medical history as detailed below presents for evaluation.  Patient transported by EMS from her facility.  Initial call to EMS was for difficulty speaking.  Code stroke initiated by EMS in the field.  Per EMS, patient with rapidly improving aphasia.  Upon arrival to the ED the patient's symptoms have resolved.  EMS also reports that they were to observed episodes of possible V. tach during transport.  Patient did not have symptoms during these observed episodes.  Both episodes were approximately 15 to 20 seconds and length.  No treatment required by EMS prior to resolution of possible V. tach on monitor.  Prior medical history includes hypertension, hyperlipidemia, paroxysmal A-fib on Eliquis, chronic lower extremity edema, RA, history of PE, history of DVT, COPD, obesity.  Patient is DNR.  Patient is able to answer questions regarding her DNR status easily.  She appears to have capacity to make her own medical decisions.  She is able to express clearly the fact that she would not want CPR, intubation, or electrical shock to treat arrhythmia.  The history is provided by the patient and medical records.       Home Medications Prior to Admission medications   Medication Sig Start Date End Date Taking? Authorizing Provider  albuterol (VENTOLIN HFA) 108 (90 Base) MCG/ACT inhaler Inhale 2 puffs into the lungs every 6 (six) hours as needed for wheezing or shortness of breath.    [provider]  apixaban (ELIQUIS) 5 MG TABS tablet Take 5 mg by mouth 2 (two) times daily.    [provider]  aspirin EC 81 MG tablet Take 81 mg by mouth daily.    [provider]  Calcium Carb-Cholecalciferol (CALCIUM  600/VITAMIN D3) 600-20 MG-MCG TABS Take 1 tablet by mouth at bedtime.    [provider]  calcium carbonate (OS-CAL - DOSED IN MG OF ELEMENTAL CALCIUM) 1250 (500 Ca) MG tablet Take 500 mg of elemental calcium by mouth at bedtime.    [provider]  calcium carbonate (TUMS EX) 750 MG chewable tablet Chew 750 mg by mouth every 12 (twelve) hours as needed for heartburn.    [provider]  CARBOXYMETHYLCELLULOSE SODIUM OP Place 1 drop into both eyes every 8 (eight) hours as needed (dry eyes).    [provider]  Cholecalciferol (VITAMIN D3) 50 MCG (2000 UT) TABS Take 2,000 Units by mouth daily.    [provider]  DULoxetine (CYMBALTA) 30 MG capsule Take 30 mg by mouth at bedtime.    [provider]  fexofenadine (ALLEGRA) 180 MG tablet Take 180 mg by mouth at bedtime.    [provider]  Fluticasone-Salmeterol (ADVAIR) 250-50 MCG/DOSE AEPB Inhale 1 puff into the lungs 2 (two) times daily.    [provider]  folic acid (FOLVITE) 1 MG tablet Take 1 mg by mouth daily.    [provider]  furosemide (LASIX) 20 MG tablet Take 20 mg by mouth daily. 12/05/20   [provider]  Guaifenesin (MUCINEX MAXIMUM STRENGTH) 1200 MG TB12 Take 1,200 mg by mouth every 12 (twelve) hours as needed (cough, congestion).    [provider]  guaiFENesin (MUCINEX) 600 MG 12 hr tablet Take 600  mg by mouth every 12 (twelve) hours.    [provider]  INCRUSE ELLIPTA 62.5 MCG/INH AEPB Inhale 1 puff into the lungs daily. 11/27/20   [provider]  ipratropium-albuterol (DUONEB) 0.5-2.5 (3) MG/3ML SOLN Take 3 mLs by nebulization every 6 (six) hours as needed (wheezing).     [provider]  LACTOBACILLUS PO Take 1 tablet by mouth daily.    [provider]  leflunomide (ARAVA) 10 MG tablet Take 10 mg by mouth daily.    [provider]  levothyroxine (SYNTHROID) 150 MCG tablet Take 150 mcg by  mouth daily.    [provider]  loperamide (IMODIUM A-D) 2 MG tablet Take 2 mg by mouth every 8 (eight) hours as needed for diarrhea or loose stools.    [provider]  losartan (COZAAR) 25 MG tablet Take 25 mg by mouth daily.    [provider]  metoprolol succinate (TOPROL-XL) 50 MG 24 hr tablet Take 1 tablet (50 mg total) by mouth 2 (two) times daily. Take with or immediately following a meal. 03/21/23 04/20/23  Hughie Closs, MD  metoprolol tartrate (LOPRESSOR) 50 MG tablet Take 50 mg by mouth 2 (two) times daily.    [provider]  naproxen sodium (ALEVE) 220 MG tablet Take 220 mg by mouth See admin instructions. 2 entries on MAR : 1) 220 mg once daily every morning 0900 2) 220 mg every 8 hours as needed for pain (must be 8 hours after scheduled dose)    [provider]  ondansetron (ZOFRAN) 4 MG tablet Take 4 mg by mouth every 8 (eight) hours as needed for nausea or vomiting.    [provider]  oxyCODONE (OXY IR/ROXICODONE) 5 MG immediate release tablet Take 1 tablet (5 mg total) by mouth every 4 (four) hours as needed for moderate pain. 03/21/23   Hughie Closs, MD  potassium chloride SA (K-DUR) 20 MEQ tablet Take 2 tablets (40 mEq total) by mouth 2 (two) times daily. Patient taking differently: Take 20 mEq by mouth daily. 05/26/19   Swayze, Ava, DO  pravastatin (PRAVACHOL) 40 MG tablet Take 40 mg by mouth at bedtime.    [provider]  predniSONE (DELTASONE) 5 MG tablet Take 5 mg by mouth 2 (two) times daily.    [provider]      Allergies    Celestone [betamethasone], Decadron [dexamethasone], Latex, Remicade [infliximab], Tape, Tylenol [acetaminophen], Z-pak [azithromycin], Zestril [lisinopril], Claritin [loratadine], and Wound dressing adhesive    Review of Systems   Review of Systems  All other systems reviewed and are negative.   Physical Exam Updated Vital Signs There were no vitals taken for this  visit. Physical Exam Vitals and nursing note reviewed.  Constitutional:      General: She is not in acute distress.    Appearance: Normal appearance. She is well-developed.  HENT:     Head: Normocephalic and atraumatic.  Eyes:     Conjunctiva/sclera: Conjunctivae normal.     Pupils: Pupils are equal, round, and reactive to light.  Cardiovascular:     Rate and Rhythm: Normal rate and regular rhythm.     Heart sounds: Normal heart sounds.  Pulmonary:     Effort: Pulmonary effort is normal. No respiratory distress.     Breath sounds: Normal breath sounds.  Abdominal:     General: There is no distension.     Palpations: Abdomen is soft.     Tenderness: There is no abdominal tenderness.  Musculoskeletal:        General: No deformity. Normal range of motion.     Cervical back: Normal range of motion and neck supple.  Skin:    General: Skin is warm and dry.  Neurological:     General: No focal deficit present.     Mental Status: She is alert and oriented to person, place, and time. Mental status is at baseline.     Cranial Nerves: No cranial nerve deficit.     Sensory: No sensory deficit.     Motor: No weakness.     ED Results / Procedures / Treatments   Labs (all labs ordered are listed, but only abnormal results are displayed) Labs Reviewed  I-STAT CHEM 8, ED - Abnormal; Notable for the following components:      Result Value   BUN 27 (*)    Creatinine, Ser 1.30 (*)    All other components within normal limits  PROTIME-INR  APTT  CBC  DIFFERENTIAL  COMPREHENSIVE METABOLIC PANEL  ETHANOL  CBG MONITORING, ED  TROPONIN I (HIGH SENSITIVITY)    EKG None  Radiology No results found.  Procedures Procedures    Medications Ordered in ED Medications  sodium chloride flush (NS) 0.9 % injection 3 mL (has no administration in time range)    ED Course/ Medical Decision Making/ A&P                                 Medical Decision Making Amount and/or Complexity  of Data Reviewed Labs: ordered. Radiology: ordered.    Medical Screen Complete  This patient presented to the ED with complaint of transient aphasia, possible arrhythmia.  This complaint involves an extensive number of treatment options. The initial differential diagnosis includes, but is not limited to, metabolic abnormality, TIA, CVA, arrhythmia  This presentation is: Acute, Chronic, Self-Limited, Previously Undiagnosed, Uncertain Prognosis, Complicated, Systemic Symptoms, and Threat to Life/Bodily Function  Patient presents initially for evaluation of aphasia.  During EMS transport symptoms resolved completely.  Patient seen by neuroteam on arrival.  Patient downgraded from code stroke.  CT imaging of the brain is without acute abnormality.  Patient does not require additional neuro intervention or evaluation per neurology team.  EMS reported 2 episodes of possible V. tach during transport.  EMS reported but did not record 2 episodes of approximately 30 seconds of V. tach.  Patient did not have symptoms during these episodes.  Patient did not require treatment of possible V. tach.  Patient is neurologically at baseline on arrival to the ED.  Screening labs obtained are without significant acute abnormality.  Given possibility of significant arrhythmia case discussed briefly with cardiology.  They recommend telemetry monitoring x 24 hours.  Cardiology is happy to consult if arrhythmia detected  Medicine team aware of case and evaluate for admission.   Additional history obtained:  Additional history obtained from EMS and Family External records from outside sources obtained and reviewed including prior ED visits and prior Inpatient records.    Lab Tests:  I ordered and personally interpreted labs.  The pertinent results include: BC, CMP, i-STAT Chem-8, troponin, INR, ethanol   Imaging Studies ordered:  I ordered imaging studies including CT head, chest x-ray I independently  visualized and interpreted obtained imaging which showed NAD I agree with the radiologist interpretation.   Cardiac Monitoring:  The patient was maintained on a cardiac monitor.  I personally viewed and interpreted  the cardiac monitor which showed an underlying rhythm of: NSR    Problem List / ED Course:  Transient aphasia, possible arrhythmi   Reevaluation:  After the interventions noted above, I reevaluated the patient and found that they have: improved  Disposition:  After consideration of the diagnostic results and the patients response to treatment, I feel that the patent would benefit from admission.          Final Clinical Impression(s) / ED Diagnoses Final diagnoses:  Aphasia  Cardiac arrhythmia, unspecified cardiac arrhythmia type    Rx / DC Orders ED Discharge Orders     None         Wynetta Fines, MD 05/30/23 [phone]

## 2023-05-30 NOTE — H&P (Addendum)
History and Physical    Kristina May VHQ:[id number] DOB: 10-29-1935 DOA: 05/30/2023  PCP: Jettie Pagan, NP  Patient coming from: [facility]  I have personally briefly reviewed patient's old medical records in [facility]  Chief Complaint:    HPI: Kristina May is a 87 y.o. female with medical history significant of  hypertension, hyperlipidemia, paroxysmal A-fib on Eliquis, chronic lower extremity edema, RA, history of PE/ DVT, COPD, obesity who presented to ED BIB EMS from [facility] with LKW of 0900 with acute onset of word finding difficulties and slurred speech.  Of note on arrival to ED symptoms has completely resolved.  Patient now states she is at her baseline without any complaints.  Of note during transport patient per EMS patient had 2 episodes of nonsustained VTACH lasting around 20 sec , which resolved without intervention.  however this was not captured on strips and  during the episode patient was asymptomatic. Patient notes that she feels back to her baseline currently. She states during  ride to ED she has no new complaints of chest pain , sob/ n/v/palpitations or presyncope. She currently denies any focal weakness above baseline, and notes he speech is back to baseline, daughter at beside agrees.  ED Course:  IN ED patient was a code stroke.  Initial head CT noted no acute findings.  Neuro exam was also noted to be non-focal  Neurology recommended admission for further TIA evaluation.   Labs Vitals:afeb, 181/102 repeat 156/96,  hr 59 rr 20  Wbc: 10.6, hgb 13.6, plt 255 Na 140, K 4.1, CL 107, bicarb 22, glu 87, cr 1.27 at baseline Ce 28,24 CTH:NAD Cxr NAD NSR LAFB, LVH Review of Systems: As per HPI otherwise 10 point review of systems negative.   Past Medical History:  Diagnosis Date   A-fib (HCC)    COPD (chronic obstructive pulmonary disease) (HCC)    Dyslipidemia    Hypertension    Pancreatitis    PE (pulmonary thromboembolism) (HCC)    Rheumatoid  arthritis(714.0)    Shingles    Thyroid disease     Past Surgical History:  Procedure Laterality Date   APPENDECTOMY     FOOT SURGERY     TOTAL HIP ARTHROPLASTY Left      reports that she has quit smoking. She has never used smokeless tobacco. She reports current alcohol use. She reports that she does not use drugs.  Allergies  Allergen Reactions   Celestone [Betamethasone] Hives   Decadron [Dexamethasone] Hives   Latex Rash    Blistering rash   Remicade [Infliximab] Rash and Other (See Comments)    Remicade = "She went to rehab because of it"   Tape Rash    Blistering rash   Tylenol [Acetaminophen] Other (See Comments)    Not effective for pt   Z-Pak [Azithromycin] Other (See Comments)    Unknown reaction   Zestril [Lisinopril] Other (See Comments)    Unknown reaction   Claritin [Loratadine] Other (See Comments)    Not effective for pt   Wound Dressing Adhesive Rash    Blistering rash    Family History  Problem Relation Age of Onset   Multiple sclerosis Other     Prior to Admission medications   Medication Sig Start Date End Date Taking? Authorizing Provider  albuterol (VENTOLIN HFA) 108 (90 Base) MCG/ACT inhaler Inhale 2 puffs into the lungs every 4 (four) hours as needed for wheezing or shortness of breath.   Yes [provider]  apixaban (  ELIQUIS) 2.5 MG TABS tablet Take 2.5 mg by mouth daily.   Yes [provider]  aspirin EC 81 MG tablet Take 81 mg by mouth daily.   Yes [provider]  Calcium Carb-Cholecalciferol (CALCIUM 600/VITAMIN D3) 600-20 MG-MCG TABS Take 1 tablet by mouth every evening.   Yes [provider]  calcium carbonate (OS-CAL - DOSED IN MG OF ELEMENTAL CALCIUM) 1250 (500 Ca) MG tablet Take 500 mg of elemental calcium by mouth daily.   Yes [provider]  calcium carbonate (TUMS EX) 750 MG chewable tablet Chew 750 mg by mouth every 12 (twelve) hours as needed for heartburn.   Yes [provider]  CARBOXYMETHYLCELLULOSE SODIUM OP Place 1 drop into the right eye every 8 (eight) hours as needed (dry eyes).   Yes [provider]  Cholecalciferol (VITAMIN D3) 50 MCG (2000 UT) TABS Take 2,000 Units by mouth daily.   Yes [provider]  DULoxetine (CYMBALTA) 30 MG capsule Take 30 mg by mouth every evening.   Yes [provider]  fexofenadine (ALLEGRA HIVES 24HR) 180 MG tablet Take 180 mg by mouth daily.   Yes [provider]  Fluticasone-Salmeterol (ADVAIR) 250-50 MCG/DOSE AEPB Inhale 1 puff into the lungs 2 (two) times daily.   Yes [provider]  folic acid (FOLVITE) 1 MG tablet Take 1 mg by mouth daily.   Yes [provider]  furosemide (LASIX) 20 MG tablet Take 20 mg by mouth daily. 12/05/20  Yes [provider]  glimepiride (AMARYL) 1 MG tablet Take 1 mg by mouth daily.   Yes [provider]  Guaifenesin (MUCINEX MAXIMUM STRENGTH) 1200 MG TB12 Take 1,200 mg by mouth every 12 (twelve) hours as needed (cough/congestion).   Yes [provider]  guaiFENesin (MUCINEX) 600 MG 12 hr tablet Take 600 mg by mouth every 12 (twelve) hours.   Yes [provider]  INCRUSE ELLIPTA 62.5 MCG/INH AEPB Inhale 1 puff into the lungs daily. 11/27/20  Yes [provider]  ipratropium-albuterol (DUONEB) 0.5-2.5 (3) MG/3ML SOLN Take 3 mLs by nebulization every 6 (six) hours as needed (wheezing/SOB).   Yes [provider]  LACTOBACILLUS PO Take 1 tablet by mouth daily.   Yes [provider]  leflunomide (ARAVA) 10 MG tablet Take 10 mg by mouth daily.   Yes [provider]  levothyroxine (SYNTHROID) 150 MCG tablet Take 150 mcg by mouth in the morning.   Yes [provider]  loperamide (IMODIUM A-D) 2 MG tablet Take 2 mg by mouth every 8 (eight) hours as needed for diarrhea or loose stools.   Yes [provider]  losartan (COZAAR) 25 MG tablet Take 25 mg by mouth daily.   Yes  [provider]  metoprolol tartrate (LOPRESSOR) 50 MG tablet Take 50 mg by mouth 2 (two) times daily.   Yes [provider]  naproxen sodium (ALEVE) 220 MG tablet Take 220 mg by mouth in the morning and at bedtime.   Yes [provider]  ondansetron (ZOFRAN) 4 MG tablet Take 4 mg by mouth every 8 (eight) hours as needed for nausea or vomiting.   Yes [provider]  potassium chloride SA (K-DUR) 20 MEQ tablet Take 2 tablets (40 mEq total) by mouth 2 (two) times daily. Patient taking differently: Take 20 mEq by mouth daily. 05/26/19  Yes Swayze, Ava, DO  pravastatin (PRAVACHOL) 40 MG tablet Take 40 mg by mouth every evening.   Yes [provider]  predniSONE (DELTASONE) 5 MG tablet Take 5 mg by mouth 2 (two) times daily.   Yes [provider]  metoprolol succinate (TOPROL-XL) 50 MG 24 hr tablet Take 1 tablet (50 mg total) by mouth 2 (two) times daily. Take with or immediately following a meal. Patient not taking: Reported on 05/30/2023 03/21/23 05/30/23  Hughie Closs, MD    Physical Exam: Vitals:   05/30/23 1115 05/30/23 1130 05/30/23 1145 05/30/23 1200  BP: (!) 143/128 (!) 158/106 (!) 173/104 (!) 180/107  Pulse: (!) 49 (!) 56 (!) 59 (!) 58  Resp: 17 17 19 17   Temp:      TempSrc:      SpO2: 99% 100% 99% 100%  Weight:        Constitutional: NAD, calm, comfortable,obese Vitals:   05/30/23 1115 05/30/23 1130 05/30/23 1145 05/30/23 1200  BP: (!) 143/128 (!) 158/106 (!) 173/104 (!) 180/107  Pulse: (!) 49 (!) 56 (!) 59 (!) 58  Resp: 17 17 19 17   Temp:      TempSrc:      SpO2: 99% 100% 99% 100%  Weight:       Eyes: PERRL, lids and conjunctivae normal ENMT: Mucous membranes are moist. Posterior pharynx clear of any exudate or lesions.Normal dentition.  Neck: normal, supple, no masses, no thyromegaly Respiratory: clear to auscultation bilaterally, rare wheezing, no crackles. Normal respiratory effort. No accessory muscle use.   Cardiovascular: Regular rate and rhythm, no murmurs / rubs / gallops. + extremity edema. 2+ pedal pulses.  Abdomen: no tenderness, no masses palpated. No hepatosplenomegaly. Bowel sounds positive.  Musculoskeletal: no clubbing / cyanosis. No joint deformity upper and lower extremities. Good ROM, no contractures. Normal muscle tone.  Skin: no rashes, lesions, ulcers. No induration Neurologic: CN 2-12 grossly intact. Sensation intact, DTR normal 5/5 upper extremity , 3-4/5 lower extremity  Psychiatric: Normal judgment and insight. Alert and oriented to person and place.   Labs on Admission: I have personally reviewed following labs and imaging studies  CBC: Recent Labs  Lab 05/30/23 1018 05/30/23 1027  WBC 10.6*  --   NEUTROABS 8.1*  --   HGB 13.6 13.3  HCT 42.6 39.0  MCV 93.0  --   PLT 255  --    Basic Metabolic Panel: Recent Labs  Lab 05/30/23 1018 05/30/23 1027  NA 140 140  K 4.1 4.3  CL 107 107  CO2 22  --   GLUCOSE 87 87  BUN 24* 27*  CREATININE 1.27* 1.30*  CALCIUM 9.1  --    GFR: Estimated Creatinine Clearance: 41.6 mL/min (A) (by C-G formula based on SCr of 1.3 mg/dL (H)). Liver Function Tests: Recent Labs  Lab 05/30/23 1018  AST 15  ALT 20  ALKPHOS 95  BILITOT 0.7  PROT 6.0*  ALBUMIN 3.2*   No results for input(s): "LIPASE", "AMYLASE" in the last 168 hours. No results for input(s): "AMMONIA" in the last 168 hours. Coagulation Profile: Recent Labs  Lab 05/30/23 1018  INR 1.1   Cardiac Enzymes: No results for input(s): "CKTOTAL", "CKMB", "CKMBINDEX", "TROPONINI" in the last 168 hours. BNP (last 3 results) No results for input(s): "PROBNP" in the last 8760 hours. HbA1C: No results for input(s): "HGBA1C" in the last 72 hours. CBG: Recent Labs  Lab 05/30/23 1018  GLUCAP 86   Lipid Profile: No results for input(s): "CHOL", "HDL", "LDLCALC", "TRIG", "CHOLHDL", "LDLDIRECT" in the last 72 hours. Thyroid Function Tests: No results for input(s):  "TSH", "T4TOTAL", "FREET4", "T3FREE", "THYROIDAB" in the last  72 hours. Anemia Panel: No results for input(s): "VITAMINB12", "FOLATE", "FERRITIN", "TIBC", "IRON", "RETICCTPCT" in the last 72 hours. Urine analysis:    Component Value Date/Time   COLORURINE YELLOW 03/20/2023 1233   APPEARANCEUR CLEAR 03/20/2023 1233   LABSPEC 1.018 03/20/2023 1233   PHURINE 5.0 03/20/2023 1233   GLUCOSEU NEGATIVE 03/20/2023 1233   HGBUR NEGATIVE 03/20/2023 1233   BILIRUBINUR NEGATIVE 03/20/2023 1233   KETONESUR NEGATIVE 03/20/2023 1233   PROTEINUR NEGATIVE 03/20/2023 1233   NITRITE NEGATIVE 03/20/2023 1233   LEUKOCYTESUR NEGATIVE 03/20/2023 1233    Radiological Exams on Admission: DG Chest Port 1 View  Result Date: 05/30/2023 CLINICAL DATA:  Weakness. EXAM: PORTABLE CHEST 1 VIEW COMPARISON:  12/09/2020. FINDINGS: Bilateral lung fields are clear. Bilateral lateral costophrenic angles are clear. Stable cardio-mediastinal silhouette. No acute osseous abnormalities. The soft tissues are within normal limits. IMPRESSION: No active disease. Electronically Signed   By: Jules Schick M.D.   On: 05/30/2023 11:47   CT HEAD CODE STROKE WO CONTRAST  Result Date: 05/30/2023 CLINICAL DATA:  Code stroke.  Confusion EXAM: CT HEAD WITHOUT CONTRAST TECHNIQUE: Contiguous axial images were obtained from the base of the skull through the vertex without intravenous contrast. RADIATION DOSE REDUCTION: This exam was performed according to the departmental dose-optimization program which includes automated exposure control, adjustment of the mA and/or kV according to patient size and/or use of iterative reconstruction technique. COMPARISON:  CT/CTA head and neck 03/21/2023 FINDINGS: Brain: There is no acute intracranial hemorrhage, extra-axial fluid collection, or acute territorial infarct Parenchymal volume is stable. The ventricles are stable in size. Gray-white differentiation is preserved. Hypodensity in the supratentorial white  matter likely reflecting sequela of underlying chronic small-vessel ischemic change is stable. 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 There is no mass lesion. There is no mass effect or midline shift. The pituitary and suprasellar region are normal. Vascular: There is calcification of the bilateral carotid siphons. Skull: Normal. Negative for fracture or focal lesion. Sinuses/Orbits: The paranasal sinuses are clear. Bilateral lens implants are in place. The globes and orbits are otherwise unremarkable. Other: The mastoid air cells and middle ear cavities are clear. ASPECTS [facility] Stroke Program Early CT Score) - Ganglionic level infarction (caudate, lentiform nuclei, internal capsule, insula, M1-M3 cortex): 7 - Supraganglionic infarction (M4-M6 cortex): 3 Total score (0-10 with 10 being normal): 10 IMPRESSION: No acute intracranial pathology. Findings communicated to Dr Derry Lory at 10:39 am. Electronically Signed   By: Lesia Hausen M.D.   On: 05/30/2023 10:42    EKG: Independently reviewed.see above   Assessment/Plan   TIA  -slurred speech /word finding difficulties -resolved on arrival  -initially code stroke which cancelled on arrival  -CTH negative  - Per neuro no further work up needed as patient is on max therapy  -per neuro recommend resume home stroke ppx with apixaban  -will admit for monitoring overnight   Possible episode of Non sustained Vtach -  question of 2 episodes of Nonsustained VTACH -it was not captured and no further episode on admit to ED - patient asymptomatic  - per cardiology recommended  admit for observation   Uncontrolled HTN -permissive hypertension for now  -resume home regimen in am as able   HLD -resume statin  paroxysmal A-fib  -cont on Eliquis -current in sinus    RA -resume DMARD  History of PE/ DVT -on OAC   Chronic lower extremity edema -due to venostasis  -supportive care  COPD -no active  exacerbation  -prn nebs  -resume home regimen once med rec completed   Abn CE  -note to be flat  -noted no significant delta  -further evaluation base on clinical course  Obesity  Morbid obesity  Complicates overall prognosis and care Lifestyle modification and exercise has been discussed with patient in detail  DVT prophylaxis:  OAC Family Communication: Jacob,Cindy (Daughter) [phone] (Home Phone)  CODE: DNR / as discussed per patient wishes in event of cardiac arrest  Disposition Plan:  patient  expected to be admitted less than 2 midnights  Consults called: neurology  Admission status: cardiac tele   Lurline Del MD [facility]  If 7PM-7AM, please contact night-coverage [email or web address] Password [facility]  05/30/2023, 2:24 PM

## 2023-05-30 NOTE — Evaluation (Signed)
Occupational Therapy Evaluation Patient Details Name: Kristina May MRN: [id number] DOB: [birth year] Today's Date: 05/30/2023   History of Present Illness 87 y.o. female with past medical history of A fib on Eliquis, COPD, HTN, HLD, PE, hypothyroidism who presents from ALF for acute onset of stuttering words and inability to get words out.   Clinical Impression   Patient admitted for the diagnosis above.  PTA she resides at a local ALF, has assist from staff for bathing and dressing, and walks apartment distances with a 4WRW.  Patient is very close to her baseline, but would benefit from continued acute OT to maintain functional status for eventual return to her ALF.  COPD and poor activity tolerance is the biggest deficit.  Patient is not interested in post acute OT at her facility.         If plan is discharge home, recommend the following: Assist for transportation;Assistance with cooking/housework;A lot of help with bathing/dressing/bathroom;A little help with walking and/or transfers    Functional Status Assessment  Patient has had a recent decline in their functional status and demonstrates the ability to make significant improvements in function in a reasonable and predictable amount of time.  Equipment Recommendations  None recommended by OT    Recommendations for Other Services       Precautions / Restrictions Precautions Precautions: Fall Restrictions Weight Bearing Restrictions: No      Mobility Bed Mobility Overal bed mobility: Needs Assistance Bed Mobility: Supine to Sit, Sit to Supine     Supine to sit: Supervision Sit to supine: Min assist   General bed mobility comments: Sleeps in a recliner at home    Transfers Overall transfer level: Needs assistance Equipment used: Rolling walker (2 wheels) Transfers: Sit to/from Stand, Bed to chair/wheelchair/BSC Sit to Stand: Supervision     Step pivot transfers: Supervision            Balance Overall  balance assessment: Needs assistance Sitting-balance support: Feet supported Sitting balance-Leahy Scale: Good     Standing balance support: Reliant on assistive device for balance Standing balance-Leahy Scale: Fair                             ADL either performed or assessed with clinical judgement   ADL       Grooming: Wash/dry hands;Wash/dry face;Supervision/safety;Standing           Upper Body Dressing : Supervision/safety;Sitting   Lower Body Dressing: Moderate assistance;Sit to/from stand   Toilet Transfer: Supervision/safety;Rolling walker (2 wheels);Regular Toilet;Ambulation                   Vision Patient Visual Report: No change from baseline       Perception Perception: Within Functional Limits       Praxis Praxis: WFL       Pertinent Vitals/Pain Pain Assessment Pain Assessment: Faces Faces Pain Scale: Hurts a little bit Pain Location: Neuropathy to B LE's Pain Descriptors / Indicators: Sore Pain Intervention(s): Monitored during session     Extremity/Trunk Assessment Upper Extremity Assessment Upper Extremity Assessment: Overall WFL for tasks assessed   Lower Extremity Assessment Lower Extremity Assessment: Defer to PT evaluation   Cervical / Trunk Assessment Cervical / Trunk Assessment: Kyphotic   Communication Communication Communication: No apparent difficulties   Cognition Arousal: Alert Behavior During Therapy: WFL for tasks assessed/performed Overall Cognitive Status: History of cognitive impairments - at baseline  General Comments: Mild ST Memory deficits     General Comments   Watch BP    Exercises     Shoulder Instructions      Home Living Family/patient expects to be discharged to:: Assisted living                             Home Equipment: Rollator (4 wheels);Rolling Walker (2 wheels);Wheelchair - power          Prior  Functioning/Environment Prior Level of Function : Needs assist             Mobility Comments: Able to ambulate short bedroom distances with a rollator mod I. Just got an electric w/c and wants to learn to use it for longer mobility distances ADLs Comments: takes herself to the bathroom; needs assistance with bathing (reports more A with bathing than dressing); facility does medications and food        OT Problem List: Decreased strength;Decreased activity tolerance;Impaired balance (sitting and/or standing)      OT Treatment/Interventions: Self-care/ADL training;Therapeutic activities;Balance training;DME and/or AE instruction;Patient/family education    OT Goals(Current goals can be found in the care plan section) Acute Rehab OT Goals Patient Stated Goal: Return home OT Goal Formulation: With patient Time For Goal Achievement: 06/13/23 Potential to Achieve Goals: Good  OT Frequency: Min 1X/week    Co-evaluation              AM-PAC OT "6 Clicks" Daily Activity     Outcome Measure Help from another person eating meals?: None Help from another person taking care of personal grooming?: A Little Help from another person toileting, which includes using toliet, bedpan, or urinal?: A Little Help from another person bathing (including washing, rinsing, drying)?: A Lot Help from another person to put on and taking off regular upper body clothing?: A Little Help from another person to put on and taking off regular lower body clothing?: A Lot 6 Click Score: 17   End of Session Equipment Utilized During Treatment: Rolling walker (2 wheels) Nurse Communication: Mobility status  Activity Tolerance: Patient tolerated treatment well Patient left: in bed;with call bell/phone within reach  OT Visit Diagnosis: Unsteadiness on feet (R26.81)                Time: 1600-1620 OT Time Calculation (min): 20 min Charges:  OT General Charges $OT Visit: 1 Visit OT Evaluation $OT Eval  Moderate Complexity: 1 Mod  05/30/2023  RP, OTR/L  Acute Rehabilitation Services  Office:  [phone]   Suzanna Obey 05/30/2023, 4:30 PM

## 2023-05-30 NOTE — Consult Note (Addendum)
Neurology Consultation  Reason for Consult: Code stroke  Referring Physician: Dr. Rodena Medin  CC: aphasia   History is obtained from:patient, EMS and medical record   HPI: Kristina May is a 87 y.o. female with past medical history of A fib on Eliquis, COPD, HTN, HLD, PE, hypothyroidism who presents from ALF for acute onset of stuttering words and inability to get words out. Code stroke was activated by EMS. En route to ED patient went into V tach 2 times lasting about 20 seconds and then resolved without intervention. Aphasia symptoms resolved in route to ED. Code stroke CT head no acute process, aspects 10 Code stroke canceled by Dr Derry Lory.    LKW: 0900 IV thrombolysis given?: no, not a stroke EVT:  No LVO Premorbid modified Rankin scale (mRS):  2-Slight disability-UNABLE to perform all activities but does not need assistance   ROS: Full ROS was performed and is negative except as noted in the HPI.    Past Medical History:  Diagnosis Date   A-fib (HCC)    COPD (chronic obstructive pulmonary disease) (HCC)    Dyslipidemia    Hypertension    Pancreatitis    PE (pulmonary thromboembolism) (HCC)    Rheumatoid arthritis(714.0)    Shingles    Thyroid disease      Family History  Problem Relation Age of Onset   Multiple sclerosis Other      Social History:   reports that she has quit smoking. She has never used smokeless tobacco. She reports current alcohol use. She reports that she does not use drugs.  Medications  Current Facility-Administered Medications:    sodium chloride flush (NS) 0.9 % injection 3 mL, 3 mL, Intravenous, Once, Messick, Noralyn Pick, MD  Current Outpatient Medications:    albuterol (VENTOLIN HFA) 108 (90 Base) MCG/ACT inhaler, Inhale 2 puffs into the lungs every 6 (six) hours as needed for wheezing or shortness of breath., Disp: , Rfl:    apixaban (ELIQUIS) 5 MG TABS tablet, Take 5 mg by mouth 2 (two) times daily., Disp: , Rfl:    aspirin EC 81 MG  tablet, Take 81 mg by mouth daily., Disp: , Rfl:    Calcium Carb-Cholecalciferol (CALCIUM 600/VITAMIN D3) 600-20 MG-MCG TABS, Take 1 tablet by mouth at bedtime., Disp: , Rfl:    calcium carbonate (OS-CAL - DOSED IN MG OF ELEMENTAL CALCIUM) 1250 (500 Ca) MG tablet, Take 500 mg of elemental calcium by mouth at bedtime., Disp: , Rfl:    calcium carbonate (TUMS EX) 750 MG chewable tablet, Chew 750 mg by mouth every 12 (twelve) hours as needed for heartburn., Disp: , Rfl:    CARBOXYMETHYLCELLULOSE SODIUM OP, Place 1 drop into both eyes every 8 (eight) hours as needed (dry eyes)., Disp: , Rfl:    Cholecalciferol (VITAMIN D3) 50 MCG (2000 UT) TABS, Take 2,000 Units by mouth daily., Disp: , Rfl:    DULoxetine (CYMBALTA) 30 MG capsule, Take 30 mg by mouth at bedtime., Disp: , Rfl:    fexofenadine (ALLEGRA) 180 MG tablet, Take 180 mg by mouth at bedtime., Disp: , Rfl:    Fluticasone-Salmeterol (ADVAIR) 250-50 MCG/DOSE AEPB, Inhale 1 puff into the lungs 2 (two) times daily., Disp: , Rfl:    folic acid (FOLVITE) 1 MG tablet, Take 1 mg by mouth daily., Disp: , Rfl:    furosemide (LASIX) 20 MG tablet, Take 20 mg by mouth daily., Disp: , Rfl:    Guaifenesin (MUCINEX MAXIMUM STRENGTH) 1200 MG TB12, Take 1,200 mg by mouth  every 12 (twelve) hours as needed (cough, congestion)., Disp: , Rfl:    guaiFENesin (MUCINEX) 600 MG 12 hr tablet, Take 600 mg by mouth every 12 (twelve) hours., Disp: , Rfl:    INCRUSE ELLIPTA 62.5 MCG/INH AEPB, Inhale 1 puff into the lungs daily., Disp: , Rfl:    ipratropium-albuterol (DUONEB) 0.5-2.5 (3) MG/3ML SOLN, Take 3 mLs by nebulization every 6 (six) hours as needed (wheezing). , Disp: , Rfl:    LACTOBACILLUS PO, Take 1 tablet by mouth daily., Disp: , Rfl:    leflunomide (ARAVA) 10 MG tablet, Take 10 mg by mouth daily., Disp: , Rfl:    levothyroxine (SYNTHROID) 150 MCG tablet, Take 150 mcg by mouth daily., Disp: , Rfl:    loperamide (IMODIUM A-D) 2 MG tablet, Take 2 mg by mouth every 8  (eight) hours as needed for diarrhea or loose stools., Disp: , Rfl:    losartan (COZAAR) 25 MG tablet, Take 25 mg by mouth daily., Disp: , Rfl:    metoprolol succinate (TOPROL-XL) 50 MG 24 hr tablet, Take 1 tablet (50 mg total) by mouth 2 (two) times daily. Take with or immediately following a meal., Disp: 60 tablet, Rfl: 0   metoprolol tartrate (LOPRESSOR) 50 MG tablet, Take 50 mg by mouth 2 (two) times daily., Disp: , Rfl:    naproxen sodium (ALEVE) 220 MG tablet, Take 220 mg by mouth See admin instructions. 2 entries on MAR : 1) 220 mg once daily every morning 0900 2) 220 mg every 8 hours as needed for pain (must be 8 hours after scheduled dose), Disp: , Rfl:    ondansetron (ZOFRAN) 4 MG tablet, Take 4 mg by mouth every 8 (eight) hours as needed for nausea or vomiting., Disp: , Rfl:    oxyCODONE (OXY IR/ROXICODONE) 5 MG immediate release tablet, Take 1 tablet (5 mg total) by mouth every 4 (four) hours as needed for moderate pain., Disp: 10 tablet, Rfl: 0   potassium chloride SA (K-DUR) 20 MEQ tablet, Take 2 tablets (40 mEq total) by mouth 2 (two) times daily. (Patient taking differently: Take 20 mEq by mouth daily.), Disp: 60 tablet, Rfl: 0   pravastatin (PRAVACHOL) 40 MG tablet, Take 40 mg by mouth at bedtime., Disp: , Rfl:    predniSONE (DELTASONE) 5 MG tablet, Take 5 mg by mouth 2 (two) times daily., Disp: , Rfl:    Exam: Current vital signs: There were no vitals taken for this visit. Vital signs in last 24 hours:    GENERAL: Awake, alert in NAD HEENT: - Normocephalic and atraumatic, dry mm LUNGS - Clear to auscultation bilaterally with no wheezes CV - S1S2 RRR, no m/r/g, equal pulses bilaterally. ABDOMEN - Soft, nontender, nondistended with normoactive BS Ext: warm, well perfused, intact peripheral pulses, no edema  NEURO:  Mental Status: AA&Ox3  Language: speech is clear.  Naming, repetition, fluency, and comprehension intact. Cranial Nerves: PERRL EOMI, visual fields full, no  facial asymmetry, facial sensation intact, hearing intact, tongue/uvula/soft palate midline, normal sternocleidomastoid and trapezius muscle strength. No evidence of tongue atrophy or fibrillations Motor: 5/5 in all 4 extremities  Tone: is normal and bulk is normal Sensation- Intact to light touch bilaterally Coordination: FTN intact bilaterally, no ataxia in BLE. Gait- deferred  NIHSS 1a Level of Conscious.: 0 1b LOC Questions: 0 1c LOC Commands: 0 2 Best Gaze: 0 3 Visual: 0 4 Facial Palsy: 0 5a Motor Arm - left: 0 5b Motor Arm - Right: 0 6a Motor Leg - Left:  0 6b Motor Leg - Right: 0 7 Limb Ataxia: 0 8 Sensory: 0 9 Best Language: 0 10 Dysarthria: 0 11 Extinct. and Inatten.: 0 TOTAL: 0   Labs I have reviewed labs in epic and the results pertinent to this consultation are:  CBC    Component Value Date/Time   WBC 9.7 03/20/2023 1134   RBC 4.75 03/20/2023 1134   HGB 13.3 05/30/2023 1027   HCT 39.0 05/30/2023 1027   PLT 204 03/20/2023 1134   MCV 93.5 03/20/2023 1134   MCH 28.6 03/20/2023 1134   MCHC 30.6 03/20/2023 1134   RDW 14.6 03/20/2023 1134   LYMPHSABS 0.7 03/20/2023 1134   MONOABS 0.6 03/20/2023 1134   EOSABS 0.2 03/20/2023 1134   BASOSABS 0.1 03/20/2023 1134    CMP     Component Value Date/Time   NA 140 05/30/2023 1027   K 4.3 05/30/2023 1027   CL 107 05/30/2023 1027   CO2 23 03/20/2023 1134   GLUCOSE 87 05/30/2023 1027   BUN 27 (H) 05/30/2023 1027   CREATININE 1.30 (H) 05/30/2023 1027   CALCIUM 9.3 03/20/2023 1134   PROT 5.5 (L) 03/20/2023 1134   ALBUMIN 3.1 (L) 03/20/2023 1134   AST 16 03/20/2023 1134   ALT 21 03/20/2023 1134   ALKPHOS 109 03/20/2023 1134   BILITOT 0.3 03/20/2023 1134   GFRNONAA 43 (L) 03/20/2023 1134   GFRAA 56 (L) 05/21/2019 0659    Lipid Panel     Component Value Date/Time   CHOL 147 03/21/2023 0358   TRIG 236 (H) 03/21/2023 0358   HDL 45 03/21/2023 0358   CHOLHDL 3.3 03/21/2023 0358   VLDL 47 (H) 03/21/2023 0358    LDLCALC 55 03/21/2023 0358    Lab Results  Component Value Date   HGBA1C 6.7 (H) 03/20/2023      Imaging I have reviewed the images obtained:  CT-head no acute process, aspects 10  Assessment:  87 y.o. female with past medical history of A fib on Eliquis, COPD, HTN, HLD, PE, hypothyroidism who presents from ALF for acute onset of stuttering words and inability to get words out. While en route to hospital had episodes of V tach which resolved without interventions.   Recommendations: - Cancel Code stroke    Gevena Mart DNP, ACNPC-AG  [facility]  NEUROHOSPITALIST ADDENDUM Performed a face to face diagnostic evaluation.   I have reviewed the contents of history and physical exam as documented by PA/ARNP/Resident and agree with above documentation.  I have discussed and formulated the above plan as documented. Edits to the note have been made as needed.  Impression/Key exam findings/Plan: suspect that she likely had confusion in the setting of Vtach rather than isolated aphasia. She is on maximal medical therapy for stroke with eliquis and in addition is on aspirin. CT head is negative for ICH. She has no focal deficit with intact mentation right now. Low overall suspicion for stroke.  We will signoff. Please feel free to contact us with any questions or concerns.  Erick Blinks, MD [facility] [phone]   If 7pm to 7am, please call on call as listed on AMION.

## 2023-05-30 NOTE — Progress Notes (Signed)
Admitted patient from the ED via stretcher. She is alert and oriented x 3. Pt gets confused at times. She is aware the she came from a Assisted Living Facility but cannot remember the name. She is able to follow commands. No unilateral weakness noted. She denies any pain at this time. Skin assessed and witnessed by Serina Cowper RN (See Flowsheet). Safety precautions observed. Call bell placed within reach.

## 2023-05-31 ENCOUNTER — Encounter (HOSPITAL_COMMUNITY): Payer: Self-pay | Admitting: Internal Medicine

## 2023-05-31 DIAGNOSIS — I472 Ventricular tachycardia, unspecified: Secondary | ICD-10-CM | POA: Diagnosis not present

## 2023-05-31 DIAGNOSIS — I480 Paroxysmal atrial fibrillation: Secondary | ICD-10-CM | POA: Diagnosis not present

## 2023-05-31 DIAGNOSIS — I499 Cardiac arrhythmia, unspecified: Secondary | ICD-10-CM | POA: Diagnosis not present

## 2023-05-31 LAB — MRSA NEXT GEN BY PCR, NASAL: MRSA by PCR Next Gen: NOT DETECTED

## 2023-05-31 LAB — COMPREHENSIVE METABOLIC PANEL
ALT: 17 U/L (ref 0–44)
AST: 12 U/L — ABNORMAL LOW (ref 15–41)
Albumin: 2.9 g/dL — ABNORMAL LOW (ref 3.5–5.0)
Alkaline Phosphatase: 81 U/L (ref 38–126)
Anion gap: 8 (ref 5–15)
BUN: 20 mg/dL (ref 8–23)
CO2: 25 mmol/L (ref 22–32)
Calcium: 9.3 mg/dL (ref 8.9–10.3)
Chloride: 107 mmol/L (ref 98–111)
Creatinine, Ser: 1.19 mg/dL — ABNORMAL HIGH (ref 0.44–1.00)
GFR, Estimated: 45 mL/min — ABNORMAL LOW (ref >60)
Glucose, Bld: 128 mg/dL — ABNORMAL HIGH (ref 70–99)
Potassium: 3.5 mmol/L (ref 3.5–5.1)
Sodium: 140 mmol/L (ref 135–145)
Total Bilirubin: 0.3 mg/dL (ref 0.3–1.2)
Total Protein: 5.4 g/dL — ABNORMAL LOW (ref 6.5–8.1)

## 2023-05-31 LAB — URINALYSIS, ROUTINE W REFLEX MICROSCOPIC
Bilirubin Urine: NEGATIVE
Glucose, UA: NEGATIVE mg/dL
Ketones, ur: 5 mg/dL — AB
Nitrite: POSITIVE — AB
Protein, ur: NEGATIVE mg/dL
Specific Gravity, Urine: 1.023 (ref 1.005–1.030)
pH: 5 (ref 5.0–8.0)

## 2023-05-31 LAB — MAGNESIUM: Magnesium: 2.1 mg/dL (ref 1.7–2.4)

## 2023-05-31 MED ORDER — IPRATROPIUM-ALBUTEROL 0.5-2.5 (3) MG/3ML IN SOLN
3.0000 mL | RESPIRATORY_TRACT | Status: DC
  Administered 2023-05-31 – 2023-06-01 (×3): 3 mL via RESPIRATORY_TRACT
  Filled 2023-05-31 (×3): qty 3

## 2023-05-31 MED ORDER — NAPROXEN 250 MG PO TABS
250.0000 mg | ORAL_TABLET | Freq: Two times a day (BID) | ORAL | Status: DC
  Administered 2023-05-31: 250 mg via ORAL
  Filled 2023-05-31 (×2): qty 1

## 2023-05-31 MED ORDER — APIXABAN 2.5 MG PO TABS
2.5000 mg | ORAL_TABLET | Freq: Two times a day (BID) | ORAL | Status: DC
  Administered 2023-05-31 – 2023-06-01 (×3): 2.5 mg via ORAL
  Filled 2023-05-31 (×3): qty 1

## 2023-05-31 MED ORDER — APIXABAN 2.5 MG PO TABS: 2.5000 mg | ORAL_TABLET | Freq: Every day | ORAL | Status: DC

## 2023-05-31 MED ORDER — DULOXETINE HCL 30 MG PO CPEP
30.0000 mg | ORAL_CAPSULE | Freq: Every evening | ORAL | Status: DC
  Administered 2023-05-31: 30 mg via ORAL
  Filled 2023-05-31: qty 1

## 2023-05-31 MED ORDER — NYSTATIN 100000 UNIT/GM EX POWD
Freq: Three times a day (TID) | CUTANEOUS | Status: DC
  Filled 2023-05-31: qty 15

## 2023-05-31 MED ORDER — MOMETASONE FURO-FORMOTEROL FUM 100-5 MCG/ACT IN AERO
2.0000 | INHALATION_SPRAY | Freq: Two times a day (BID) | RESPIRATORY_TRACT | Status: DC
  Administered 2023-05-31 – 2023-06-01 (×2): 2 via RESPIRATORY_TRACT
  Filled 2023-05-31: qty 8.8

## 2023-05-31 MED ORDER — METOPROLOL TARTRATE 50 MG PO TABS
50.0000 mg | ORAL_TABLET | Freq: Two times a day (BID) | ORAL | Status: DC
  Administered 2023-05-31 – 2023-06-01 (×3): 50 mg via ORAL
  Filled 2023-05-31 (×3): qty 1

## 2023-05-31 NOTE — Evaluation (Signed)
Physical Therapy Evaluation  Patient Details Name: Kristina May MRN: [id number] DOB: 07/15/1936 Today's Date: 05/31/2023  History of Present Illness  Pt is an 87 y.o. female who presents from ALF for acute onset of stuttering words and inability to get words out. PMH significant for A fib on Eliquis, COPD, HTN, PE, hypothyroidism.   Clinical Impression  Pt admitted with above diagnosis. She presents from [place] [facility] and at baseline is able to ambulate short distances (to the bathroom is about the maximum she typically does at one time) with a rollator for support. Pt no longer goes to the dining room for meals, and eats all meals in her apartment. Pt requires assist for ADL's at baseline. At the time of PT eval pt was able to perform transfers and ambulation with CGA and RW for support. Pt with 4/4 DOE after ambulation which daughter states is normal for her. Of note O2 sats 97% on RA during this time. Daughter and son both present and supportive throughout session. They state pt can be resistive to follow up therapy and question moving her to a higher level of care vs staying at assisted living. Discussed options at d/c for short term rehab at the SNF level, home health at ALF, and hiring a caregiver to increase supervision/assist as needed without pt having to leave her ALF. It appears pt is near baseline of function and could benefit from follow up PT to maximize functional independence, safety, and decrease risk for falls. Acutely, pt currently with functional limitations due to the deficits listed below (see PT Problem List). Pt will benefit from acute skilled PT to increase their independence and safety with mobility to allow discharge.           If plan is discharge home, recommend the following: A little help with walking and/or transfers;A little help with bathing/dressing/bathroom;Assistance with cooking/housework;Assist for transportation;Help with stairs or ramp for entrance   Can  travel by private vehicle        Equipment Recommendations None recommended by PT  Recommendations for Other Services       Functional Status Assessment Patient has had a recent decline in their functional status and demonstrates the ability to make significant improvements in function in a reasonable and predictable amount of time.     Precautions / Restrictions Precautions Precautions: Fall Precaution Comments: Fatigues quickly with OOB mobility. Restrictions Weight Bearing Restrictions: No      Mobility  Bed Mobility Overal bed mobility: Needs Assistance Bed Mobility: Supine to Sit     Supine to sit: Contact guard     General bed mobility comments: Sleeps in a recliner at home. Hands on guarding at the back as pt scooted out to get feet on the floor. Pt with mild posterior lean.    Transfers Overall transfer level: Needs assistance Equipment used: Rolling walker (2 wheels) Transfers: Sit to/from Stand, Bed to chair/wheelchair/BSC Sit to Stand: Contact guard assist           General transfer comment: VC's for hand placement on seated surface for safety. Pt with decreased safety awareness, letting go of walker and pivoting to "land" in the chair at end of session. When cued for safety pt states "eh, it's good enough."    Ambulation/Gait Ambulation/Gait assistance: Contact guard assist Gait Distance (Feet): 40 Feet Assistive device: Rolling walker (2 wheels) Gait Pattern/deviations: Decreased stride length, Trunk flexed, Wide base of support, Step-through pattern Gait velocity: Decreased Gait velocity interpretation: <1.31 ft/sec, indicative of household  ambulator   General Gait Details: VC's for improved posture, closer walker proximity and forward gaze. Low floor clearance and approaching a shuffling gait pattern. Pt fatigues quickly and has 4/4 DOE upon seated rest break at the end of gait training.  Stairs            Wheelchair Mobility     Tilt  Bed    Modified Rankin (Stroke Patients Only)       Balance Overall balance assessment: Needs assistance Sitting-balance support: Feet supported Sitting balance-Leahy Scale: Good     Standing balance support: Reliant on assistive device for balance Standing balance-Leahy Scale: Fair                               Pertinent Vitals/Pain Pain Assessment Pain Assessment: Faces Faces Pain Scale: Hurts a little bit Pain Location: Neuropathy to B LE's Pain Descriptors / Indicators: Sore Pain Intervention(s): Limited activity within patient's tolerance, Monitored during session, Repositioned    Home Living Family/patient expects to be discharged to:: Assisted living                 Home Equipment: Rollator (4 wheels);Rolling Walker (2 wheels);Wheelchair - power      Prior Function Prior Level of Function : Needs assist             Mobility Comments: Able to ambulate short bedroom distances with a rollator mod I. Just got an electric w/c and wants to learn to use it for longer mobility distances. Currently does not use w/c as she is scared to drive it. ADLs Comments: takes herself to the bathroom; needs assistance with bathing (reports more A with bathing than dressing); facility does medications and food     Extremity/Trunk Assessment   Upper Extremity Assessment Upper Extremity Assessment: Generalized weakness (Pt with baseline arthritic changes in shoulders and hands. AROM is limited. 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 Overall generally weak with poor muscular endurance with OOB mobility.)    Cervical / Trunk Assessment Cervical / Trunk Assessment: Kyphotic  Communication   Communication Communication: No apparent  difficulties  Cognition Arousal: Alert Behavior During Therapy: WFL for tasks assessed/performed Overall Cognitive Status: History of cognitive impairments - at baseline                                 General Comments: Mild ST Memory deficits. Pt making a joke or answers somewhat inappropriately to therapist's questions or statements. Family attributes this to [facility] but question processing. Poor insight into current deficits and safety.        General Comments      Exercises     Assessment/Plan    PT Assessment Patient needs continued PT services  PT Problem List Decreased strength;Decreased activity tolerance;Decreased balance;Decreased mobility;Decreased knowledge of use of DME;Decreased safety awareness;Decreased knowledge of precautions;Cardiopulmonary status limiting activity;Decreased cognition       PT Treatment Interventions DME instruction;Gait training;Functional mobility training;Therapeutic activities;Therapeutic exercise;Balance training;Patient/family education    PT Goals (Current goals can be found in the Care Plan section)  Acute Rehab PT Goals Patient Stated Goal: Return home so she can get some sleep PT  Goal Formulation: With patient/family Time For Goal Achievement: 06/07/23 Potential to Achieve Goals: Good    Frequency Min 1X/week     Co-evaluation               AM-PAC PT "6 Clicks" Mobility  Outcome Measure Help needed turning from your back to your side while in a flat bed without using bedrails?: A Little Help needed moving from lying on your back to sitting on the side of a flat bed without using bedrails?: A Little Help needed moving to and from a bed to a chair (including a wheelchair)?: A Little Help needed standing up from a chair using your arms (e.g., wheelchair or bedside chair)?: A Little Help needed to walk in hospital room?: A Little Help needed climbing 3-5 steps with a railing? : Total 6 Click Score: 16    End of  Session Equipment Utilized During Treatment: Gait belt Activity Tolerance: Patient limited by fatigue Patient left: in chair;with call bell/phone within reach;with chair alarm set;with family/visitor present Nurse Communication: Mobility status (Purewick needs to be replaced (pt incontinent)) PT Visit Diagnosis: Unsteadiness on feet (R26.81);Muscle weakness (generalized) (M62.81)    Time: 1610-9604 PT Time Calculation (min) (ACUTE ONLY): 39 min   Charges:   PT Evaluation $PT Eval Moderate Complexity: 1 Mod PT Treatments $Gait Training: 8-22 mins $Therapeutic Activity: 8-22 mins PT General Charges $$ ACUTE PT VISIT: 1 Visit         Conni Slipper, PT, DPT Acute Rehabilitation Services Secure Chat Preferred Office: [phone]   Marylynn Pearson 05/31/2023, 1:33 PM

## 2023-05-31 NOTE — Progress Notes (Signed)
MEDICATION RELATED NOTE   Noted patient on Apixaban, ASA81, scheduled naproxen, and prednisone daily PTA.  This places patient at increased bleeding risk, particularly GI bleeds.  Relayed information to Dr. Mahala Menghini who agreed to stopping scheduled naproxen at this time.  [employer], Pharm.D., BCPS Clinical Pharmacist 05/31/2023 2:12 PM

## 2023-05-31 NOTE — Progress Notes (Incomplete)
Kristina May is a 87 y.o. female with past medical history of  hypertension,  hyperlipidemia,  paroxysmal atrial fibrillation since 2015 on eliquis,  h/o LE edema on chronic diuretics,  RA,  h/o PE in 2017, H/o hypokalemia,  hypothyroidism, HFpEF,  Class 3 obesity,  COPD, h/o depression-pt denies,  h/o suicidal ideation - pt denies,  stage 3 CKD,  Dyspnea,  Palpitations, presented for follow-up of congestive heart failure with preserved ejection fraction, paroxysmal atrial fibrillation, dyspnea, palpitations.

## 2023-05-31 NOTE — Consult Note (Addendum)
Cardiology Consultation   Patient ID: Kristina May MRN: [id number]; DOB: January 10, 1936  Admit date: 05/30/2023 Date of Consult: 05/31/2023  PCP:  Jettie Pagan, NP    HeartCare Providers Cardiologist:  None        Patient Profile:   Kristina May is a 87 y.o. female with a hx of HFpEF, paroxysmal atrial fibrillation, PE/DVT, chronic lower extremity edema, CKD stage IIIa, hypertension, hyperlipidemia, COPD who is being seen 05/31/2023 for the evaluation of possible nonsustained ventricular tachycardia at the request of Dr. Mahala Menghini.  History of Present Illness:   Ms. Erby presented to the emergency department via EMS from an assisted living facility with acute onset stuttering of speech and for period of time with EMS reportedly became aphasic.  Apparently by the time the patient arrived to the emergency department, her symptoms had largely resolved.  Notes from ED triage nurse as well as ED provider report that EMS advised of possible observed episodes of nonsustained ventricular tachycardia during transport.  They stated that patient did not have any symptoms during these episode and noted total duration of this possible arrhythmia of approximately 15 to 20 seconds.  They did not administer any antiarrhythmic agents.  She has been followed by [facility] health cardiology since moving to [facility] in 2019, last seen 09/13/2020.   Of note, patient admitted on 03/20/2023 for evaluation of slurred speech and confusion, to presentation this current admission.  MRI showed punctate focus of acute ischemia within posterior right parietal lobe, no mass or hemorrhage.  She was seen by neurology but ultimately no changes to medications recommended and patient continued home Eliquis.  Today patient seen while sitting in bedside chair. Her son and daughter are also present. History obtained from patient and family due to transient dysphasia during exam. Per son and daughter,  patient has had steady decline in overall health over the last 10 years following RA diagnosis. She is now very limited in physical activity secondary to pain.   Patient denies any recent chest pain, palpitations, dizziness, syncope. She reports stable chronic dyspnea and lower extremity edema. No recent progression of either.  Per son and daughter, patient spends most of her day in a chair and only gets up to go to the bathroom. She only drinks water to take medications and her primary beverage is coffee. Occasionally has tea with lunch/dinner.   Past Medical History:  Diagnosis Date   A-fib (HCC)    COPD (chronic obstructive pulmonary disease) (HCC)    Dyslipidemia    Hypertension    Pancreatitis    PE (pulmonary thromboembolism) (HCC)    Rheumatoid arthritis(714.0)    Shingles    Thyroid disease     Past Surgical History:  Procedure Laterality Date   APPENDECTOMY     FOOT SURGERY     TOTAL HIP ARTHROPLASTY Left      Home Medications:  Prior to Admission medications   Medication Sig Start Date End Date Taking? Authorizing Provider  albuterol (VENTOLIN HFA) 108 (90 Base) MCG/ACT inhaler Inhale 2 puffs into the lungs every 4 (four) hours as needed for wheezing or shortness of breath.   Yes [provider]  apixaban (ELIQUIS) 2.5 MG TABS tablet Take 2.5 mg by mouth daily.   Yes [provider]  aspirin EC 81 MG tablet Take 81 mg by mouth daily.   Yes [provider]  Calcium Carb-Cholecalciferol (CALCIUM 600/VITAMIN D3) 600-20 MG-MCG TABS Take 1 tablet by mouth every evening.  Yes [provider]  calcium carbonate (OS-CAL - DOSED IN MG OF ELEMENTAL CALCIUM) 1250 (500 Ca) MG tablet Take 500 mg of elemental calcium by mouth daily.   Yes [provider]  calcium carbonate (TUMS EX) 750 MG chewable tablet Chew 750 mg by mouth every 12 (twelve) hours as needed for heartburn.   Yes [provider]  CARBOXYMETHYLCELLULOSE SODIUM OP  Place 1 drop into the right eye every 8 (eight) hours as needed (dry eyes).   Yes [provider]  Cholecalciferol (VITAMIN D3) 50 MCG (2000 UT) TABS Take 2,000 Units by mouth daily.   Yes [provider]  DULoxetine (CYMBALTA) 30 MG capsule Take 30 mg by mouth every evening.   Yes [provider]  fexofenadine (ALLEGRA HIVES 24HR) 180 MG tablet Take 180 mg by mouth daily.   Yes [provider]  Fluticasone-Salmeterol (ADVAIR) 250-50 MCG/DOSE AEPB Inhale 1 puff into the lungs 2 (two) times daily.   Yes [provider]  folic acid (FOLVITE) 1 MG tablet Take 1 mg by mouth daily.   Yes [provider]  furosemide (LASIX) 20 MG tablet Take 20 mg by mouth daily. 12/05/20  Yes [provider]  glimepiride (AMARYL) 1 MG tablet Take 1 mg by mouth daily.   Yes [provider]  Guaifenesin (MUCINEX MAXIMUM STRENGTH) 1200 MG TB12 Take 1,200 mg by mouth every 12 (twelve) hours as needed (cough/congestion).   Yes [provider]  guaiFENesin (MUCINEX) 600 MG 12 hr tablet Take 600 mg by mouth every 12 (twelve) hours.   Yes [provider]  INCRUSE ELLIPTA 62.5 MCG/INH AEPB Inhale 1 puff into the lungs daily. 11/27/20  Yes [provider]  ipratropium-albuterol (DUONEB) 0.5-2.5 (3) MG/3ML SOLN Take 3 mLs by nebulization every 6 (six) hours as needed (wheezing/SOB).   Yes [provider]  LACTOBACILLUS PO Take 1 tablet by mouth daily.   Yes [provider]  leflunomide (ARAVA) 10 MG tablet Take 10 mg by mouth daily.   Yes [provider]  levothyroxine (SYNTHROID) 150 MCG tablet Take 150 mcg by mouth in the morning.   Yes [provider]  loperamide (IMODIUM A-D) 2 MG tablet Take 2 mg by mouth every 8 (eight) hours as needed for diarrhea or loose stools.   Yes [provider]  losartan (COZAAR) 25 MG tablet Take 25 mg by mouth daily.   Yes [provider]  metoprolol  tartrate (LOPRESSOR) 50 MG tablet Take 50 mg by mouth 2 (two) times daily.   Yes [provider]  naproxen sodium (ALEVE) 220 MG tablet Take 220 mg by mouth in the morning and at bedtime.   Yes [provider]  ondansetron (ZOFRAN) 4 MG tablet Take 4 mg by mouth every 8 (eight) hours as needed for nausea or vomiting.   Yes [provider]  potassium chloride SA (K-DUR) 20 MEQ tablet Take 2 tablets (40 mEq total) by mouth 2 (two) times daily. Patient taking differently: Take 20 mEq by mouth daily. 05/26/19  Yes Swayze, Ava, DO  pravastatin (PRAVACHOL) 40 MG tablet Take 40 mg by mouth every evening.   Yes [provider]  predniSONE (DELTASONE) 5 MG tablet Take 5 mg by mouth 2 (two) times daily.   Yes [provider]  metoprolol succinate (TOPROL-XL) 50 MG 24 hr tablet Take 1 tablet (50 mg total) by mouth 2 (two) times daily. Take with or immediately following a meal. Patient not taking: Reported  on 05/30/2023 03/21/23 05/30/23  Hughie Closs, MD    Inpatient Medications: Scheduled Meds:  apixaban  2.5 mg Oral BID   aspirin EC  81 mg Oral Daily   DULoxetine  30 mg Oral QPM   leflunomide  10 mg Oral Daily   metoprolol tartrate  50 mg Oral BID   sodium chloride flush  3 mL Intravenous Once   Continuous Infusions:  sodium chloride 50 mL/hr at 05/31/23 0815   PRN Meds: acetaminophen **OR** acetaminophen (TYLENOL) oral liquid 160 mg/5 mL **OR** acetaminophen, senna-docusate  Allergies:    Allergies  Allergen Reactions   Celestone [Betamethasone] Hives   Decadron [Dexamethasone] Hives   Latex Rash    Blistering rash   Remicade [Infliximab] Rash and Other (See Comments)    Remicade = "She went to rehab because of it"   Tape Rash    Blistering rash   Tylenol [Acetaminophen] Other (See Comments)    Not effective for pt   Z-Pak [Azithromycin] Other (See Comments)    Unknown reaction   Zestril [Lisinopril] Other (See Comments)    Unknown reaction    Claritin [Loratadine] Other (See Comments)    Not effective for pt   Wound Dressing Adhesive Rash    Blistering rash    Social History:   Social History   Socioeconomic History   Marital status: Widowed    Spouse name: Not on file   Number of children: Not on file   Years of education: Not on file   Highest education level: Not on file  Occupational History   Not on file  Tobacco Use   Smoking status: Former   Smokeless tobacco: Never  Substance and Sexual Activity   Alcohol use: Yes    Comment: Occ   Drug use: No   Sexual activity: Not on file  Other Topics Concern   Not on file  Social History Narrative   Not on file   Social Determinants of Health   Financial Resource Strain: Low Risk  (07/19/2021)   Received from [facility], [facility]   Overall Financial Resource Strain (CARDIA)    Difficulty of Paying Living Expenses: Not hard at all  Food Insecurity: No Food Insecurity (05/30/2023)   Hunger Vital Sign    Worried About Running Out of Food in the Last Year: Never true    Ran Out of Food in the Last Year: Never true  Transportation Needs: No Transportation Needs (05/30/2023)   PRAPARE - Administrator, Civil Service (Medical): No    Lack of Transportation (Non-Medical): No  Physical Activity: Inactive (07/19/2021)   Received from [facility], [facility]   Exercise Vital Sign    Days of Exercise per Week: 0 days    Minutes of Exercise per Session: 0 min  Stress: No Stress Concern Present (07/19/2021)   Received from [facility], [facility] - Occupational Stress Questionnaire    Feeling of Stress : Only a little  Social Connections: Unknown (02/03/2022)   Received from [facility], [facility]   Social Network    Social Network: Not on file  Intimate Partner Violence: Not At Risk (05/30/2023)   Humiliation, Afraid, Rape, and Kick questionnaire    Fear of Current or Ex-Partner: No     Emotionally Abused: No    Physically Abused: No    Sexually Abused: No    Family History:    Family History  Problem Relation Age of  Onset   Multiple sclerosis Other      ROS:  Please see the history of present illness.   All other ROS reviewed and negative.     Physical Exam/Data:   Vitals:   05/31/23 0336 05/31/23 0748 05/31/23 1145 05/31/23 1544  BP: (!) 185/83 (!) 163/84 (!) 154/92 (!) 161/81  Pulse: 78 88 64 70  Resp: 20 20 20 20   Temp: 97.9 F (36.6 C) 98 F (36.7 C) 98.8 F (37.1 C) 98.4 F (36.9 C)  TempSrc: Oral Oral Oral Oral  SpO2: 97% 97% 97% 98%  Weight:      Height:        Intake/Output Summary (Last 24 hours) at 05/31/2023 1554 Last data filed at 05/31/2023 1429 Gross per 24 hour  Intake 557.01 ml  Output 300 ml  Net 257.01 ml      05/30/2023    7:43 PM 05/30/2023   10:30 AM 03/20/2023   11:27 AM  Last 3 Weights  Weight (lbs) 271 lb 9.7 oz 262 lb 12.6 oz 250 lb  Weight (kg) 123.2 kg 119.2 kg 113.399 kg     Body mass index is 43.84 kg/m.  General:  Well nourished, well developed, in no acute distress. HEENT: normal Neck: no JVD Vascular: No carotid bruits; Distal pulses 2+ bilaterally Cardiac:  normal S1, S2; RRR; no murmur Lungs:  faint lower lobe rhonchi. Otherwise clear bilaterally Abd: soft, nontender, no hepatomegaly  Ext: mixed pitting/non pitting edema bilaterally with visual appearance of venous stasis.  Musculoskeletal:  No deformities, BUE and BLE strength normal and equal Skin: warm and dry  Neuro:  transient dysphasia during exam. Patient intermittently with incoherent speech which daughter and son report has been ongoing today Psych:  Normal affect when not dyphasic.   EKG:  The EKG was personally reviewed and demonstrates: Sinus rhythm with first-degree AV block, left anterior fascicular block morphology, abnormal R wave progression, borderline LVH criteria Telemetry:  Telemetry was personally reviewed and demonstrates:  sinus  rhythm with prolonged PR interval. Occasional PVCs/paired PVCs. Isolated 12 second run of bigeminy this morning.  Relevant CV Studies:  03/20/23 TTE  IMPRESSIONS     1. Left ventricular ejection fraction, by estimation, is 60 to 65%. The  left ventricle has normal function. The left ventricle has no regional  wall motion abnormalities. There is mild left ventricular hypertrophy.  Left ventricular diastolic parameters  are consistent with Grade I diastolic dysfunction (impaired relaxation).   2. Right ventricular systolic function is normal. The right ventricular  size is normal.   3. Left atrial size was mildly dilated.   4. A small pericardial effusion is present. There is no evidence of  cardiac tamponade.   5. No evidence of mitral valve regurgitation. Moderate mitral annular  calcification.   6. The aortic valve was not well visualized. Aortic valve regurgitation  is not visualized.   7. The inferior vena cava is normal in size with greater than 50%  respiratory variability, suggesting right atrial pressure of 3 mmHg.   Conclusion(s)/Recommendation(s): If there is a high suspicion for a  cardioembolic source, consider limited TTE with a bubble study.   FINDINGS   Left Ventricle: Left ventricular ejection fraction, by estimation, is 60  to 65%. The left ventricle has normal function. The left ventricle has no  regional wall motion abnormalities. The left ventricular internal cavity  size was normal in size. There is   mild left ventricular hypertrophy. Left ventricular diastolic parameters  are  consistent with Grade I diastolic dysfunction (impaired relaxation).   Right Ventricle: The right ventricular size is normal. Right ventricular  systolic function is normal.   Left Atrium: Left atrial size was mildly dilated.   Right Atrium: Right atrial size was normal in size.   Pericardium: A small pericardial effusion is present. There is no evidence  of cardiac tamponade.    Mitral Valve: Moderate mitral annular calcification. No evidence of mitral  valve regurgitation.   Tricuspid Valve: Tricuspid valve regurgitation is not demonstrated.   Aortic Valve: The aortic valve was not well visualized. Aortic valve  regurgitation is not visualized.   Pulmonic Valve: Pulmonic valve regurgitation is not visualized.   Aorta: The aortic root and ascending aorta are structurally normal, with  no evidence of dilitation.   Venous: The inferior vena cava is normal in size with greater than 50%  respiratory variability, suggesting right atrial pressure of 3 mmHg.   IAS/Shunts: The interatrial septum was not well visualized.   Laboratory Data:  High Sensitivity Troponin:   Recent Labs  Lab 05/30/23 1019 05/30/23 1538  TROPONINIHS 28* 24*     Chemistry Recent Labs  Lab 05/30/23 1018 05/30/23 1027 05/30/23 1538 05/31/23 1017  NA 140 140  --  140  K 4.1 4.3  --  3.5  CL 107 107  --  107  CO2 22  --   --  25  GLUCOSE 87 87  --  128*  BUN 24* 27*  --  20  CREATININE 1.27* 1.30* 1.13* 1.19*  CALCIUM 9.1  --   --  9.3  MG  --   --   --  2.1  GFRNONAA 41*  --  48* 45*  ANIONGAP 11  --   --  8    Recent Labs  Lab 05/30/23 1018 05/31/23 1017  PROT 6.0* 5.4*  ALBUMIN 3.2* 2.9*  AST 15 12*  ALT 20 17  ALKPHOS 95 81  BILITOT 0.7 0.3   Lipids No results for input(s): "CHOL", "TRIG", "HDL", "LABVLDL", "LDLCALC", "CHOLHDL" in the last 168 hours.  Hematology Recent Labs  Lab 05/30/23 1018 05/30/23 1027 05/30/23 1538  WBC 10.6*  --  11.3*  RBC 4.58  --  4.52  HGB 13.6 13.3 13.1  HCT 42.6 39.0 41.7  MCV 93.0  --  92.3  MCH 29.7  --  29.0  MCHC 31.9  --  31.4  RDW 13.8  --  13.8  PLT 255  --  212   Thyroid No results for input(s): "TSH", "FREET4" in the last 168 hours.  BNPNo results for input(s): "BNP", "PROBNP" in the last 168 hours.  DDimer No results for input(s): "DDIMER" in the last 168 hours.   Radiology/Studies:  DG Chest Port 1  View  Result Date: 05/30/2023 CLINICAL DATA:  Weakness. EXAM: PORTABLE CHEST 1 VIEW COMPARISON:  12/09/2020. FINDINGS: Bilateral lung fields are clear. Bilateral lateral costophrenic angles are clear. Stable cardio-mediastinal silhouette. No acute osseous abnormalities. The soft tissues are within normal limits. IMPRESSION: No active disease. Electronically Signed   By: Jules Schick M.D.   On: 05/30/2023 11:47   CT HEAD CODE STROKE WO CONTRAST  Result Date: 05/30/2023 CLINICAL DATA:  Code stroke.  Confusion EXAM: CT HEAD WITHOUT CONTRAST TECHNIQUE: Contiguous axial images were obtained from the base of the skull through the vertex without intravenous contrast. RADIATION DOSE REDUCTION: This exam was performed according to the departmental dose-optimization program which includes automated exposure control, adjustment of the  mA and/or kV according to patient size and/or use of iterative reconstruction technique. COMPARISON:  CT/CTA head and neck 03/21/2023 FINDINGS: Brain: There is no acute intracranial hemorrhage, extra-axial fluid collection, or acute territorial infarct Parenchymal volume is stable. The ventricles are stable in size. Gray-white differentiation is preserved. Hypodensity in the supratentorial white matter likely reflecting sequela of underlying chronic small-vessel ischemic change is stable. 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 There is no mass lesion. There is no mass effect or midline shift. The pituitary and suprasellar region are normal. Vascular: There is calcification of the bilateral carotid siphons. Skull: Normal. Negative for fracture or focal lesion. Sinuses/Orbits: The paranasal sinuses are clear. Bilateral lens implants are in place. The globes and orbits are otherwise unremarkable. Other: The mastoid air cells and middle ear cavities are clear. ASPECTS [facility] Stroke Program Early CT Score) - Ganglionic level infarction (caudate,  lentiform nuclei, internal capsule, insula, M1-M3 cortex): 7 - Supraganglionic infarction (M4-M6 cortex): 3 Total score (0-10 with 10 being normal): 10 IMPRESSION: No acute intracranial pathology. Findings communicated to Dr Derry Lory at 10:39 am. Electronically Signed   By: Lesia Hausen M.D.   On: 05/30/2023 10:42     Assessment and Plan:   Concern for NSVT  Patient admitted with transient dysphagia. This is following June 2024 admission with similar symptoms that noted punctate posterior parietal lobe infarct. Concern per EMS that patient had NSVT during transport (patient reportedly without symptoms).   No rhythm strips or ECG tracings to document reported arrhythmia. Telemetry since admission is reassuring with no evidence of NSVT. Patient with intermittent PVCs and an isolated episode of bigeminy today. 20 seconds of NSVT would be unlikely to explain her neurological abnormalities. Additionally, her dysphasia has continued during her admission despite no recurrent arrhythmia.  Discussed potential placement of Zio patch to further assess arrhythmia. Both patient and family prefer to avoid this which I feel is appropriate. given low suspicion of significant arrhythmia. She is completely asymptomatic. If she were to develop symptoms with PVCs, could increase beta blocker dose. Echocardiogram during her June admission is also reassuring   Chronic lower extremity edema HFpEF  Patient with longstanding history of lower extremity edema. Recent TTE during June admission found LVEF 60-65% and grade I DD.   On physical exam today, patient with mixed edema in bilateral lower extremities. Stable size per patient and family. Suspect that swelling is of mixed etiology with venous insufficiency.  Suggest resuming home Lasix 20mg   Hypertension  Patient's home medication list has redundant medications but appears that she was taking Losartan 25mg  in addition to Metoprolol (unclear if tartrate or  succinate).    Patient remains hypertensive but also has normal to low HR. Continue Metoprolol Tartrate 50mg  BID.  Recommend resuming Losartan 25 mg. Could also consider Amlodipine if CKD a concern.   Paroxysmal atrial fibrillation  Patient with no recent palpitations, chest tightness to suggest recurrent afib. No afib on telemetry this admission.  Continue metoprolol and Eliquis.  This admission Eliquis has been reduced to 2.5mg  BID. Per current ACC guidelines, patient should be taking full dose, 5mg  BID with creatinine <2 and weight >60kg. Given her recent stroke history and PE/DVT history, strongly recommend dose increase.  Hyperlipidemia  Continue Pravastatin 40mg .   Per primary team: Hx PE RA Hepatic steatosis cirrhosis     Risk Assessment/Risk Scores:       CHA2DS2-VASc Score = 7   This indicates a 11.2% annual  risk of stroke. The patient's score is based upon: CHF History: 1 HTN History: 1 Diabetes History: 0 Stroke History: 2 Vascular Disease History: 0 Age Score: 2 Gender Score: 1     For questions or updates, please contact [facility] Please consult [email or web address] for contact info under    Signed, Perlie Gold, PA-C  05/31/2023 3:54 PM  Patient seen and examined   I agree with findings as noted above by E Williams  Pt is an 87 yo with hx of HFpEF, PAF, PE, RA LE edema, CKD, HTN Patient is sedentary.   Asked to see pt for reported NSVT Seen by EMS   Possible 15 to 20 seconds  The pt hs not prior hx of ventricular arrhythmias   She denies palpitations  Echo in  June 2024 LVEF and RVEF normal      On exam,  Pt in NAD   Sitting in chair Neck  Full  No obvious JVD Lungs CTA Cardiac RRR  NO S3  No murmurs Ext   1-2+ edema  Tele here shows SR first degree AV block   Occasional PVCs, bigeminy  Impression: 1  VT  No saved strips WIthout definite data I would not pursue further ? If it was an atrial event  with aberrancy.    Pt with  normal LVEF   No CP  No dizziness Follow symptomatically     I would not pursue monitor  2  PAF    Continue anticoagulation    Increase Eliquis to 5 mg bid   3   LE edema  Chronic problem   On 20 mg lasix     With renal function I would not advance diuretic unless worsens       Please call with questions    Will sign off   Dietrich Pates MD

## 2023-05-31 NOTE — Progress Notes (Signed)
HOSPITALIST ROUNDING NOTE Delynne Shryock WUJ:[id number]  DOB: 12-31-1935  DOA: 05/30/2023  PCP: Jettie Pagan, NP  05/31/2023,9:35 AM   LOS: 1 day      Code Status: DNR comfort care   From: Nursing home current Dispo: Unclear at this time     87 year old female Known A-fib CHADVASC >4 on Eliquis-previous QT prolongation's History of VTE/PE HFrEF Hepatic steatosis/cirrhosis COPD on inhalers Prior admission 2022 for acute pancreatitis Rheumatoid arthritis Depression Hospitalized 6/19 through 03/22/2023 punctate posterior right parietal lobe infarct with recrudescent and possible seizure-like activity during hospitalization  Presented from ALF with acute stuttering of words inability to get words out-went into V. tach 2 times for about 20 seconds that self resolved and became aphasic Code stroke was canceled secondary to this unlikely to be stroke according to neurology who saw the patient It was felt that the confusion was in the setting of V. tach rather than isolated aphasia-CT head was negative for ICH Patient clarified at admit would not want intubation electric shock or CPR to ED provider   Plan  V. Tach-episodic in the setting of underlying known A-fib CHADVASC >4 Cardiology will be consulted-May need event monitor?-Previously seen last by cardiology Dr. Erma Heritage 2021 Fran Lowes with no specific issues regarding VT No labs performed-add on magnesium and labs now and if low will replete Could check TSH but will hold for now Continue home Lopressor 50 twice daily Home MAR reveals that she is taking her DOAC Eliquis 2.5 only once daily as opposed to twice daily and I have corrected that and we will resume it at twice daily dosing  History of VTE PE Continue on Eliquis as above  Rheumatoid arthritis  is on Areva which she can continue 10 daily  Prior H/O hepatic steatosis cirrhosis Symptomatic management only  Hypertension Several meds on MAR metoprolol XL and  Lopressor 50 twice daily-elected to use 50 twice daily tartrate Hold losartan in case we need to augment her increased rate control  Previous CVA 03/20/2023 CT head here on arrival shows no acute intracranial pathology Neurology signed off  No CODE BLUE confirmed on arrival   DVT prophylaxis: Eliquis twice daily  Status is: Inpatient Remains inpatient appropriate because:   Requires observation    Subjective: Awake coherent slow speech no distress Does not recall clearly the events of yesterday Eating drinking okay  Objective + exam Vitals:   05/30/23 2123 05/30/23 2355 05/31/23 0336 05/31/23 0748  BP: (!) 147/76 (!) 180/79 (!) 185/83 (!) 163/84  Pulse: 74 80 78 88  Resp: 16 (!) 21 20 20   Temp: 98 F (36.7 C) 97.8 F (36.6 C) 97.9 F (36.6 C) 98 F (36.7 C)  TempSrc: Oral Oral Oral Oral  SpO2: 96% 98% 97% 97%  Weight:      Height:       Filed Weights   05/30/23 1030 05/30/23 1943  Weight: 119.2 kg 123.2 kg    Examination:  Thick neck Mallampati 4, bilateral infraorbital edema S1-S2 bigeminy on monitors-I cannot appreciate where she had V. tach Abdomen soft no rebound CTAB Trace lower extremity edema Cannot appreciate a whole lot of JVD Neuro intact Psych euthymic   Data Reviewed: reviewed   CBC    Component Value Date/Time   WBC 11.3 (H) 05/30/2023 1538   RBC 4.52 05/30/2023 1538   HGB 13.1 05/30/2023 1538   HCT 41.7 05/30/2023 1538   PLT 212 05/30/2023 1538   MCV 92.3 05/30/2023 1538   MCH 29.0  05/30/2023 1538   MCHC 31.4 05/30/2023 1538   RDW 13.8 05/30/2023 1538   LYMPHSABS 1.2 05/30/2023 1018   MONOABS 0.9 05/30/2023 1018   EOSABS 0.2 05/30/2023 1018   BASOSABS 0.1 05/30/2023 1018      Latest Ref Rng & Units 05/30/2023    3:38 PM 05/30/2023   10:27 AM 05/30/2023   10:18 AM  CMP  Glucose 70 - 99 mg/dL  87  87   BUN 8 - 23 mg/dL  27  24   Creatinine 8.29 - 1.00 mg/dL 5.62  1.30  8.65   Sodium 135 - 145 mmol/L  140  140   Potassium 3.5  - 5.1 mmol/L  4.3  4.1   Chloride 98 - 111 mmol/L  107  107   CO2 22 - 32 mmol/L   22   Calcium 8.9 - 10.3 mg/dL   9.1   Total Protein 6.5 - 8.1 g/dL   6.0   Total Bilirubin 0.3 - 1.2 mg/dL   0.7   Alkaline Phos 38 - 126 U/L   95   AST 15 - 41 U/L   15   ALT 0 - 44 U/L   20      Scheduled Meds:  apixaban  2.5 mg Oral BID   aspirin EC  81 mg Oral Daily   DULoxetine  30 mg Oral QPM   leflunomide  10 mg Oral Daily   metoprolol tartrate  50 mg Oral BID   naproxen  250 mg Oral BID WC   sodium chloride flush  3 mL Intravenous Once   Continuous Infusions:  sodium chloride 50 mL/hr at 05/31/23 7846    Time 47  Rhetta Mura, MD  [facility]

## 2023-06-01 DIAGNOSIS — R4701 Aphasia: Secondary | ICD-10-CM

## 2023-06-01 LAB — COMPREHENSIVE METABOLIC PANEL
ALT: 15 U/L (ref 0–44)
AST: 14 U/L — ABNORMAL LOW (ref 15–41)
Albumin: 2.6 g/dL — ABNORMAL LOW (ref 3.5–5.0)
Alkaline Phosphatase: 76 U/L (ref 38–126)
Anion gap: 7 (ref 5–15)
BUN: 18 mg/dL (ref 8–23)
CO2: 23 mmol/L (ref 22–32)
Calcium: 8.8 mg/dL — ABNORMAL LOW (ref 8.9–10.3)
Chloride: 107 mmol/L (ref 98–111)
Creatinine, Ser: 1.09 mg/dL — ABNORMAL HIGH (ref 0.44–1.00)
GFR, Estimated: 50 mL/min — ABNORMAL LOW (ref >60)
Glucose, Bld: 141 mg/dL — ABNORMAL HIGH (ref 70–99)
Potassium: 3.6 mmol/L (ref 3.5–5.1)
Sodium: 137 mmol/L (ref 135–145)
Total Bilirubin: 0.3 mg/dL (ref 0.3–1.2)
Total Protein: 5 g/dL — ABNORMAL LOW (ref 6.5–8.1)

## 2023-06-01 LAB — MAGNESIUM: Magnesium: 1.9 mg/dL (ref 1.7–2.4)

## 2023-06-01 MED ORDER — APIXABAN 2.5 MG PO TABS: 2.5000 mg | ORAL_TABLET | Freq: Two times a day (BID) | ORAL | Status: AC

## 2023-06-01 MED ORDER — DULOXETINE HCL 30 MG PO CPEP: 30.0000 mg | ORAL_CAPSULE | Freq: Every evening | ORAL | 0 refills | Status: AC

## 2023-06-01 MED ORDER — NYSTATIN 100000 UNIT/GM EX POWD: Freq: Three times a day (TID) | CUTANEOUS | Status: AC

## 2023-06-01 NOTE — Progress Notes (Addendum)
Tried to call at [facility] to give report, but could not connect, will try again  1220: Tried again to give report but call not received  1239: called report top Chanel at [facility], waiting PTAR  for transportation  1244: Patient discharged, transported by [facility] in stretcher.

## 2023-06-01 NOTE — Discharge Summary (Signed)
Physician Discharge Summary  Kristina May NWG:[id number] DOB: [birth year] DOA: 05/30/2023  PCP: Jettie Pagan, NP  Admit date: 05/30/2023 Discharge date: 06/01/2023  Time spent: 33 minutes  Recommendations for Outpatient Follow-up:  Recommend outpatient follow-up with cardiology [facility] Note dosage change of Eliquis to 2.5 twice daily--also changed her metoprolol to metoprolol 50 twice daily instead of the XL Requires outpatient TSH in about 3 weeks, Chem-12 and CBC in 1 week  Discharge Diagnoses:  MAIN problem for hospitalization   Metabolic encephalopathy not felt to be a stroke with possible VT Cirrhosis HFrEF Previous prolonged QT Rheumatoid arthritis on Arava Depression  Please see below for itemized issues addressed in HOpsital- refer to other progress notes for clarity if needed  Discharge Condition: Improved  Diet recommendation: Heart healthy  Filed Weights   05/30/23 1030 05/30/23 1943  Weight: 119.2 kg 123.2 kg    History of present illness:  87 year old female Known A-fib CHADVASC >4 on Eliquis-previous QT prolongation's History of VTE/PE HFrEF Hepatic steatosis/cirrhosis COPD on inhalers Prior admission 2022 for acute pancreatitis Rheumatoid arthritis Depression Hospitalized 6/19 through 03/22/2023 punctate posterior right parietal lobe infarct with recrudescent and possible seizure-like activity during hospitalization   Presented from ALF with acute stuttering of words inability to get words out-went into V. tach 2 times for about 20 seconds that self resolved and became aphasic Code stroke was canceled secondary to this unlikely to be stroke according to neurology who saw the patient It was felt that the confusion was in the setting of V. tach rather than isolated aphasia-CT head was negative for ICH Patient clarified at admit would not want intubation electric shock or CPR to ED provider  Hospital Course:  Unconfirmed V. Tach-episodic in the  setting of underlying known A-fib CHADVASC >4 Dr. Cardiology here felt that the patient did not need any workup or even a monitor Should follow-up in the outpatient with cardiology Dr. Erma Heritage 2021 Fran Lowes with no specific issues regarding VT Continue home Lopressor 50 twice daily Clarified that she should be on 2.5 Eliquis twice daily at discharge   History of VTE PE Continue on Eliquis as above   Rheumatoid arthritis  is on Areva which she can continue 10 daily   Prior HFrEF Resumed Lasix at discharge at prior to admission dose on day of discharge  CKD 3 AA chronic Get labs in the outpatient setting  Prior H/O hepatic steatosis cirrhosis Symptomatic management only   Hypertension Meds were resumed as per prior home dosing and may resume losartan   Previous CVA 03/20/2023 CT head here on arrival shows no acute intracranial pathology Neurology signed off   No CODE BLUE confirmed on arrival   Discharge Exam: Vitals:   06/01/23 0755 06/01/23 0814  BP: (!) 159/84   Pulse: 85 78  Resp: 19 18  Temp: 98.5 F (36.9 C)   SpO2: 96% 98%    Subj on day of d/c   Awake coherent no distress eating a bagel seems comfortable  General Exam on discharge  Thick neck Mallampati 4 infraorbital edema Chest is clear no wheeze rales rhonchi S1-S2 no murmur Scattered 1-2 beat runs of SVT but nothing significant Abdomen obese nontender no rebound Trace lower extremity edema Power 5/5 psych euthymic  Discharge Instructions   Discharge Instructions     Diet - low sodium heart healthy   Complete by: As directed    Discharge instructions   Complete by: As directed    This hospitalization you were diagnosed  with an altered state and it was not felt to be a stroke by the neurologist You should carefully take your meds and get labs in about a week with your primary physician Please note that the dosage of your Eliquis is dropped to 2.5 twice a day as per age guideline  recommendations also note that your metoprolol has been switched to twice a daily med and you should follow-up with your cardiologist when able   Increase activity slowly   Complete by: As directed       Allergies as of 06/01/2023       Reactions   Celestone [betamethasone] Hives   Decadron [dexamethasone] Hives   Latex Rash   Blistering rash   Remicade [infliximab] Rash, Other (See Comments)   Remicade = "She went to rehab because of it"   Tape Rash   Blistering rash   Tylenol [acetaminophen] Other (See Comments)   Not effective for pt   Z-pak [azithromycin] Other (See Comments)   Unknown reaction   Zestril [lisinopril] Other (See Comments)   Unknown reaction   Claritin [loratadine] Other (See Comments)   Not effective for pt   Wound Dressing Adhesive Rash   Blistering rash        Medication List     STOP taking these medications    albuterol 108 (90 Base) MCG/ACT inhaler Commonly known as: VENTOLIN HFA   Allegra Hives 24HR 180 MG tablet Generic drug: fexofenadine   LACTOBACILLUS PO   loperamide 2 MG tablet Commonly known as: IMODIUM A-D   metoprolol succinate 50 MG 24 hr tablet Commonly known as: TOPROL-XL   naproxen sodium 220 MG tablet Commonly known as: ALEVE   ondansetron 4 MG tablet Commonly known as: ZOFRAN   potassium chloride SA 20 MEQ tablet Commonly known as: KLOR-CON M   predniSONE 5 MG tablet Commonly known as: DELTASONE       TAKE these medications    apixaban 2.5 MG Tabs tablet Commonly known as: Eliquis Take 1 tablet (2.5 mg total) by mouth 2 (two) times daily. What changed: when to take this   aspirin EC 81 MG tablet Take 81 mg by mouth daily.   Calcium 600/Vitamin D3 600-20 MG-MCG Tabs Generic drug: Calcium Carb-Cholecalciferol Take 1 tablet by mouth every evening.   calcium carbonate 1250 (500 Ca) MG tablet Commonly known as: OS-CAL - dosed in mg of elemental calcium Take 500 mg of elemental calcium by mouth daily.    calcium carbonate 750 MG chewable tablet Commonly known as: TUMS EX Chew 750 mg by mouth every 12 (twelve) hours as needed for heartburn.   CARBOXYMETHYLCELLULOSE SODIUM OP Place 1 drop into the right eye every 8 (eight) hours as needed (dry eyes).   DULoxetine 30 MG capsule Commonly known as: CYMBALTA Take 1 capsule (30 mg total) by mouth every evening.   Fluticasone-Salmeterol 250-50 MCG/DOSE Aepb Commonly known as: ADVAIR Inhale 1 puff into the lungs 2 (two) times daily.   folic acid 1 MG tablet Commonly known as: FOLVITE Take 1 mg by mouth daily.   furosemide 20 MG tablet Commonly known as: LASIX Take 20 mg by mouth daily.   glimepiride 1 MG tablet Commonly known as: AMARYL Take 1 mg by mouth daily.   guaiFENesin 600 MG 12 hr tablet Commonly known as: MUCINEX Take 600 mg by mouth every 12 (twelve) hours.   Mucinex Maximum Strength 1200 MG Tb12 Generic drug: Guaifenesin Take 1,200 mg by mouth every 12 (twelve) hours as needed (  cough/congestion).   Incruse Ellipta 62.5 MCG/INH Aepb Generic drug: umeclidinium bromide Inhale 1 puff into the lungs daily.   ipratropium-albuterol 0.5-2.5 (3) MG/3ML Soln Commonly known as: DUONEB Take 3 mLs by nebulization every 6 (six) hours as needed (wheezing/SOB).   leflunomide 10 MG tablet Commonly known as: ARAVA Take 10 mg by mouth daily.   levothyroxine 150 MCG tablet Commonly known as: SYNTHROID Take 150 mcg by mouth in the morning.   losartan 25 MG tablet Commonly known as: COZAAR Take 25 mg by mouth daily.   metoprolol tartrate 50 MG tablet Commonly known as: LOPRESSOR Take 50 mg by mouth 2 (two) times daily.   nystatin powder Commonly known as: MYCOSTATIN/NYSTOP Apply topically 3 (three) times daily.   pravastatin 40 MG tablet Commonly known as: PRAVACHOL Take 40 mg by mouth every evening.   Vitamin D3 50 MCG (2000 UT) Tabs Take 2,000 Units by mouth daily.       Allergies  Allergen Reactions    Celestone [Betamethasone] Hives   Decadron [Dexamethasone] Hives   Latex Rash    Blistering rash   Remicade [Infliximab] Rash and Other (See Comments)    Remicade = "She went to rehab because of it"   Tape Rash    Blistering rash   Tylenol [Acetaminophen] Other (See Comments)    Not effective for pt   Z-Pak [Azithromycin] Other (See Comments)    Unknown reaction   Zestril [Lisinopril] Other (See Comments)    Unknown reaction   Claritin [Loratadine] Other (See Comments)    Not effective for pt   Wound Dressing Adhesive Rash    Blistering rash      The results of significant diagnostics from this hospitalization (including imaging, microbiology, ancillary and laboratory) are listed below for reference.    Significant Diagnostic Studies: DG Chest Port 1 View  Result Date: 05/30/2023 CLINICAL DATA:  Weakness. EXAM: PORTABLE CHEST 1 VIEW COMPARISON:  12/09/2020. FINDINGS: Bilateral lung fields are clear. Bilateral lateral costophrenic angles are clear. Stable cardio-mediastinal silhouette. No acute osseous abnormalities. The soft tissues are within normal limits. IMPRESSION: No active disease. Electronically Signed   By: Jules Schick M.D.   On: 05/30/2023 11:47   CT HEAD CODE STROKE WO CONTRAST  Result Date: 05/30/2023 CLINICAL DATA:  Code stroke.  Confusion EXAM: CT HEAD WITHOUT CONTRAST TECHNIQUE: Contiguous axial images were obtained from the base of the skull through the vertex without intravenous contrast. RADIATION DOSE REDUCTION: This exam was performed according to the departmental dose-optimization program which includes automated exposure control, adjustment of the mA and/or kV according to patient size and/or use of iterative reconstruction technique. COMPARISON:  CT/CTA head and neck 03/21/2023 FINDINGS: Brain: There is no acute intracranial hemorrhage, extra-axial fluid collection, or acute territorial infarct Parenchymal volume is stable. The ventricles are stable in size.  Gray-white differentiation is preserved. Hypodensity in the supratentorial white matter likely reflecting sequela of underlying chronic small-vessel ischemic change is stable. 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 There is no mass lesion. There is no mass effect or midline shift. The pituitary and suprasellar region are normal. Vascular: There is calcification of the bilateral carotid siphons. Skull: Normal. Negative for fracture or focal lesion. Sinuses/Orbits: The paranasal sinuses are clear. Bilateral lens implants are in place. The globes and orbits are otherwise unremarkable. Other: The mastoid air cells and middle ear cavities are clear. ASPECTS [facility] Stroke Program Early CT Score) - Ganglionic level infarction (caudate, lentiform nuclei,  internal capsule, insula, M1-M3 cortex): 7 - Supraganglionic infarction (M4-M6 cortex): 3 Total score (0-10 with 10 being normal): 10 IMPRESSION: No acute intracranial pathology. Findings communicated to Dr Derry Lory at 10:39 am. Electronically Signed   By: Lesia Hausen M.D.   On: 05/30/2023 10:42    Microbiology: Recent Results (from the past 240 hour(s))  MRSA Next Gen by PCR, Nasal     Status: None   Collection Time: 05/31/23 12:44 AM   Specimen: Nasal Mucosa; Nasal Swab  Result Value Ref Range Status   MRSA by PCR Next Gen NOT DETECTED NOT DETECTED Final    Comment: (NOTE) The GeneXpert MRSA Assay (FDA approved for NASAL specimens only), is one component of a comprehensive MRSA colonization surveillance program. It is not intended to diagnose MRSA infection nor to guide or monitor treatment for MRSA infections. Test performance is not FDA approved in patients less than 18 years old. Performed at [facility], [address] [place], [place] [address]      Labs: Basic Metabolic Panel: Recent Labs  Lab 05/30/23 1018 05/30/23 1027 05/30/23 1538 05/31/23 1017 06/01/23 0534  NA 140 140  --  140 137   K 4.1 4.3  --  3.5 3.6  CL 107 107  --  107 107  CO2 22  --   --  25 23  GLUCOSE 87 87  --  128* 141*  BUN 24* 27*  --  20 18  CREATININE 1.27* 1.30* 1.13* 1.19* 1.09*  CALCIUM 9.1  --   --  9.3 8.8*  MG  --   --   --  2.1 1.9   Liver Function Tests: Recent Labs  Lab 05/30/23 1018 05/31/23 1017 06/01/23 0534  AST 15 12* 14*  ALT 20 17 15   ALKPHOS 95 81 76  BILITOT 0.7 0.3 0.3  PROT 6.0* 5.4* 5.0*  ALBUMIN 3.2* 2.9* 2.6*   No results for input(s): "LIPASE", "AMYLASE" in the last 168 hours. No results for input(s): "AMMONIA" in the last 168 hours. CBC: Recent Labs  Lab 05/30/23 1018 05/30/23 1027 05/30/23 1538  WBC 10.6*  --  11.3*  NEUTROABS 8.1*  --   --   HGB 13.6 13.3 13.1  HCT 42.6 39.0 41.7  MCV 93.0  --  92.3  PLT 255  --  212   Cardiac Enzymes: No results for input(s): "CKTOTAL", "CKMB", "CKMBINDEX", "TROPONINI" in the last 168 hours. BNP: BNP (last 3 results) No results for input(s): "BNP" in the last 8760 hours.  ProBNP (last 3 results) No results for input(s): "PROBNP" in the last 8760 hours.  CBG: Recent Labs  Lab 05/30/23 1018  GLUCAP 86       Signed:  Rhetta Mura MD   [facility] 06/01/2023, 9:27 AM

## 2023-06-01 NOTE — TOC Transition Note (Signed)
Transition of Care [facility]) - CM/SW Discharge Note   Patient Details  Name: Kristina May MRN: [id number] Date of Birth: [birth year]  [facility] CM/SW Contact:  Deatra Robinson, [place] Phone Number: 06/01/2023, 11:23 AM   Clinical Narrative: Pt for dc back to [facility]F today. Spoke to [place] at [place] [phone] who confirmed pt able to return. DC summary faxed to [facility] at [facility] request [phone]. RN provided with number for report and PTAR arranged for transport. Pt's dtr Arline Asp aware of dc and reports agreeable. SW signing off at dc.   Dellie Burns, MSW, LCSW [phone] (coverage)        Final next level of care: Assisted Living Barriers to Discharge: No Barriers Identified   Patient Goals and CMS Choice      Discharge Placement                Patient chooses bed at: Other - please specify in the comment section below: [name]) Patient to be transferred to facility by: PTAR Name of family member notified: Cindy/Dtr Patient and family notified of of transfer: 06/01/23  Discharge Plan and Services Additional resources added to the After Visit Summary for                                       Social Determinants of Health (SDOH) Interventions SDOH Screenings   Food Insecurity: No Food Insecurity (05/30/2023)  Housing: Low Risk  (05/30/2023)  Transportation Needs: No Transportation Needs (05/30/2023)  Utilities: Not At Risk (05/30/2023)  Financial Resource Strain: Low Risk  (07/19/2021)   Received from [facility], [facility]  Physical Activity: Inactive (07/19/2021)   Received from [facility], [facility]  Social Connections: Unknown (02/03/2022)   Received from [facility], [facility]  Stress: No Stress Concern Present (07/19/2021)   Received from [facility], [facility]  Tobacco Use: Medium Risk (05/31/2023)     Readmission Risk Interventions     No data to  display

## 2023-06-01 NOTE — Plan of Care (Signed)

## 2023-08-02 DEATH — deceased
